# Patient Record
Sex: Female | Born: 1975 | Hispanic: No | Marital: Married | Smoking: Never smoker
Health system: Southern US, Community
[De-identification: ages and names within clinical notes are randomized; demographics above are authoritative.]

## PROBLEM LIST (undated history)

## (undated) DIAGNOSIS — Z982 Presence of cerebrospinal fluid drainage device: Secondary | ICD-10-CM

## (undated) DIAGNOSIS — G8929 Other chronic pain: Secondary | ICD-10-CM

## (undated) DIAGNOSIS — R519 Headache, unspecified: Secondary | ICD-10-CM

## (undated) DIAGNOSIS — R053 Chronic cough: Secondary | ICD-10-CM

## (undated) DIAGNOSIS — R51 Headache: Secondary | ICD-10-CM

## (undated) DIAGNOSIS — J90 Pleural effusion, not elsewhere classified: Secondary | ICD-10-CM

## (undated) DIAGNOSIS — R05 Cough: Secondary | ICD-10-CM

## (undated) HISTORY — DX: Other chronic pain: G89.29

## (undated) HISTORY — PX: OTHER SURGICAL HISTORY: SHX169

## (undated) HISTORY — DX: Headache, unspecified: R51.9

## (undated) HISTORY — DX: Headache: R51

---

## 2000-10-07 ENCOUNTER — Emergency Department (HOSPITAL_COMMUNITY): Admission: EM | Admit: 2000-10-07 | Discharge: 2000-10-07 | Payer: Self-pay | Admitting: Emergency Medicine

## 2000-12-04 ENCOUNTER — Ambulatory Visit (HOSPITAL_BASED_OUTPATIENT_CLINIC_OR_DEPARTMENT_OTHER): Admission: RE | Admit: 2000-12-04 | Discharge: 2000-12-04 | Payer: Self-pay

## 2001-03-09 ENCOUNTER — Emergency Department (HOSPITAL_COMMUNITY): Admission: EM | Admit: 2001-03-09 | Discharge: 2001-03-09 | Payer: Self-pay | Admitting: Emergency Medicine

## 2001-03-09 ENCOUNTER — Encounter: Payer: Self-pay | Admitting: Emergency Medicine

## 2001-03-10 ENCOUNTER — Encounter: Payer: Self-pay | Admitting: Emergency Medicine

## 2001-03-10 ENCOUNTER — Ambulatory Visit (HOSPITAL_COMMUNITY): Admission: RE | Admit: 2001-03-10 | Discharge: 2001-03-10 | Payer: Self-pay | Admitting: Emergency Medicine

## 2001-03-12 ENCOUNTER — Encounter: Payer: Self-pay | Admitting: Neurosurgery

## 2001-03-13 ENCOUNTER — Inpatient Hospital Stay (HOSPITAL_COMMUNITY): Admission: RE | Admit: 2001-03-13 | Discharge: 2001-03-15 | Payer: Self-pay | Admitting: Neurosurgery

## 2001-03-14 ENCOUNTER — Encounter: Payer: Self-pay | Admitting: Neurosurgery

## 2001-03-16 ENCOUNTER — Inpatient Hospital Stay (HOSPITAL_COMMUNITY): Admission: EM | Admit: 2001-03-16 | Discharge: 2001-03-18 | Payer: Self-pay

## 2001-03-16 ENCOUNTER — Encounter: Payer: Self-pay | Admitting: Neurosurgery

## 2001-03-25 ENCOUNTER — Encounter: Payer: Self-pay | Admitting: Emergency Medicine

## 2001-03-25 ENCOUNTER — Encounter: Payer: Self-pay | Admitting: Neurosurgery

## 2001-03-25 ENCOUNTER — Ambulatory Visit (HOSPITAL_COMMUNITY): Admission: RE | Admit: 2001-03-25 | Discharge: 2001-03-25 | Payer: Self-pay | Admitting: Neurosurgery

## 2001-03-25 ENCOUNTER — Emergency Department (HOSPITAL_COMMUNITY): Admission: EM | Admit: 2001-03-25 | Discharge: 2001-03-25 | Payer: Self-pay | Admitting: Emergency Medicine

## 2001-04-07 ENCOUNTER — Encounter: Payer: Self-pay | Admitting: Neurosurgery

## 2001-04-07 ENCOUNTER — Ambulatory Visit (HOSPITAL_COMMUNITY): Admission: RE | Admit: 2001-04-07 | Discharge: 2001-04-07 | Payer: Self-pay | Admitting: Neurosurgery

## 2001-04-09 ENCOUNTER — Emergency Department (HOSPITAL_COMMUNITY): Admission: EM | Admit: 2001-04-09 | Discharge: 2001-04-09 | Payer: Self-pay | Admitting: Emergency Medicine

## 2001-04-09 ENCOUNTER — Encounter: Payer: Self-pay | Admitting: Emergency Medicine

## 2001-04-11 ENCOUNTER — Emergency Department (HOSPITAL_COMMUNITY): Admission: EM | Admit: 2001-04-11 | Discharge: 2001-04-12 | Payer: Self-pay | Admitting: Emergency Medicine

## 2001-04-15 ENCOUNTER — Encounter: Admission: RE | Admit: 2001-04-15 | Discharge: 2001-04-15 | Payer: Self-pay | Admitting: Neurosurgery

## 2001-04-15 ENCOUNTER — Encounter: Payer: Self-pay | Admitting: Neurosurgery

## 2001-06-25 ENCOUNTER — Encounter: Payer: Self-pay | Admitting: Neurosurgery

## 2001-06-25 ENCOUNTER — Encounter: Admission: RE | Admit: 2001-06-25 | Discharge: 2001-06-25 | Payer: Self-pay | Admitting: Neurosurgery

## 2001-07-29 ENCOUNTER — Encounter: Payer: Self-pay | Admitting: Emergency Medicine

## 2001-07-29 ENCOUNTER — Emergency Department (HOSPITAL_COMMUNITY): Admission: EM | Admit: 2001-07-29 | Discharge: 2001-07-29 | Payer: Self-pay | Admitting: Emergency Medicine

## 2001-09-01 ENCOUNTER — Inpatient Hospital Stay (HOSPITAL_COMMUNITY): Admission: EM | Admit: 2001-09-01 | Discharge: 2001-09-07 | Payer: Self-pay | Admitting: *Deleted

## 2001-09-01 ENCOUNTER — Encounter: Payer: Self-pay | Admitting: Neurosurgery

## 2001-10-27 ENCOUNTER — Encounter: Payer: Self-pay | Admitting: Neurosurgery

## 2001-10-27 ENCOUNTER — Encounter: Admission: RE | Admit: 2001-10-27 | Discharge: 2001-10-27 | Payer: Self-pay | Admitting: Neurosurgery

## 2002-01-05 ENCOUNTER — Emergency Department (HOSPITAL_COMMUNITY): Admission: EM | Admit: 2002-01-05 | Discharge: 2002-01-05 | Payer: Self-pay | Admitting: Emergency Medicine

## 2002-01-05 ENCOUNTER — Encounter: Payer: Self-pay | Admitting: Emergency Medicine

## 2002-01-06 ENCOUNTER — Encounter: Payer: Self-pay | Admitting: Emergency Medicine

## 2002-01-06 ENCOUNTER — Observation Stay (HOSPITAL_COMMUNITY): Admission: EM | Admit: 2002-01-06 | Discharge: 2002-01-07 | Payer: Self-pay | Admitting: Emergency Medicine

## 2002-04-01 ENCOUNTER — Emergency Department (HOSPITAL_COMMUNITY): Admission: EM | Admit: 2002-04-01 | Discharge: 2002-04-01 | Payer: Self-pay | Admitting: Emergency Medicine

## 2002-04-01 ENCOUNTER — Encounter: Payer: Self-pay | Admitting: Emergency Medicine

## 2002-04-06 ENCOUNTER — Other Ambulatory Visit: Admission: RE | Admit: 2002-04-06 | Discharge: 2002-04-06 | Payer: Self-pay | Admitting: Gynecology

## 2002-04-14 ENCOUNTER — Emergency Department (HOSPITAL_COMMUNITY): Admission: EM | Admit: 2002-04-14 | Discharge: 2002-04-14 | Payer: Self-pay | Admitting: Emergency Medicine

## 2002-04-14 ENCOUNTER — Encounter: Payer: Self-pay | Admitting: Emergency Medicine

## 2002-04-16 ENCOUNTER — Encounter: Payer: Self-pay | Admitting: Emergency Medicine

## 2002-04-16 ENCOUNTER — Emergency Department (HOSPITAL_COMMUNITY): Admission: EM | Admit: 2002-04-16 | Discharge: 2002-04-16 | Payer: Self-pay | Admitting: Emergency Medicine

## 2002-06-12 ENCOUNTER — Encounter: Payer: Self-pay | Admitting: Emergency Medicine

## 2002-06-12 ENCOUNTER — Encounter (INDEPENDENT_AMBULATORY_CARE_PROVIDER_SITE_OTHER): Payer: Self-pay | Admitting: *Deleted

## 2002-06-12 ENCOUNTER — Inpatient Hospital Stay (HOSPITAL_COMMUNITY): Admission: EM | Admit: 2002-06-12 | Discharge: 2002-06-13 | Payer: Self-pay | Admitting: Emergency Medicine

## 2002-06-14 ENCOUNTER — Inpatient Hospital Stay (HOSPITAL_COMMUNITY): Admission: EM | Admit: 2002-06-14 | Discharge: 2002-07-16 | Payer: Self-pay | Admitting: Emergency Medicine

## 2002-06-14 ENCOUNTER — Encounter: Payer: Self-pay | Admitting: Emergency Medicine

## 2002-06-16 ENCOUNTER — Encounter: Payer: Self-pay | Admitting: Neurosurgery

## 2002-06-18 ENCOUNTER — Encounter: Payer: Self-pay | Admitting: Neurosurgery

## 2002-06-29 ENCOUNTER — Encounter: Payer: Self-pay | Admitting: Neurosurgery

## 2002-07-01 ENCOUNTER — Encounter: Payer: Self-pay | Admitting: Neurosurgery

## 2002-07-02 ENCOUNTER — Encounter: Payer: Self-pay | Admitting: Neurosurgery

## 2002-07-03 ENCOUNTER — Encounter: Payer: Self-pay | Admitting: Neurosurgery

## 2002-07-06 ENCOUNTER — Encounter: Payer: Self-pay | Admitting: Neurosurgery

## 2002-07-08 ENCOUNTER — Encounter: Payer: Self-pay | Admitting: Neurosurgery

## 2002-07-09 ENCOUNTER — Encounter: Payer: Self-pay | Admitting: Neurosurgery

## 2002-07-10 ENCOUNTER — Encounter: Payer: Self-pay | Admitting: Neurosurgery

## 2002-07-12 ENCOUNTER — Encounter: Payer: Self-pay | Admitting: Neurosurgery

## 2002-07-14 ENCOUNTER — Encounter: Payer: Self-pay | Admitting: Neurosurgery

## 2002-07-16 ENCOUNTER — Inpatient Hospital Stay (HOSPITAL_COMMUNITY)
Admission: RE | Admit: 2002-07-16 | Discharge: 2002-07-18 | Payer: Self-pay | Admitting: Physical Medicine & Rehabilitation

## 2002-07-18 ENCOUNTER — Encounter: Payer: Self-pay | Admitting: Physical Medicine & Rehabilitation

## 2002-07-18 ENCOUNTER — Inpatient Hospital Stay (HOSPITAL_COMMUNITY): Admission: AD | Admit: 2002-07-18 | Discharge: 2002-07-20 | Payer: Self-pay | Admitting: Neurosurgery

## 2002-07-20 ENCOUNTER — Inpatient Hospital Stay (HOSPITAL_COMMUNITY)
Admission: RE | Admit: 2002-07-20 | Discharge: 2002-07-29 | Payer: Self-pay | Admitting: Physical Medicine & Rehabilitation

## 2002-07-22 ENCOUNTER — Encounter: Payer: Self-pay | Admitting: Physical Medicine & Rehabilitation

## 2002-10-25 ENCOUNTER — Emergency Department (HOSPITAL_COMMUNITY): Admission: EM | Admit: 2002-10-25 | Discharge: 2002-10-26 | Payer: Self-pay | Admitting: Emergency Medicine

## 2002-10-25 ENCOUNTER — Encounter: Payer: Self-pay | Admitting: Emergency Medicine

## 2002-11-20 ENCOUNTER — Encounter: Payer: Self-pay | Admitting: Emergency Medicine

## 2002-11-20 ENCOUNTER — Emergency Department (HOSPITAL_COMMUNITY): Admission: EM | Admit: 2002-11-20 | Discharge: 2002-11-20 | Payer: Self-pay | Admitting: Emergency Medicine

## 2002-12-31 ENCOUNTER — Emergency Department (HOSPITAL_COMMUNITY): Admission: EM | Admit: 2002-12-31 | Discharge: 2002-12-31 | Payer: Self-pay | Admitting: Emergency Medicine

## 2002-12-31 ENCOUNTER — Encounter: Payer: Self-pay | Admitting: Emergency Medicine

## 2003-04-16 ENCOUNTER — Ambulatory Visit (HOSPITAL_COMMUNITY): Admission: RE | Admit: 2003-04-16 | Discharge: 2003-04-16 | Payer: Self-pay | Admitting: Neurosurgery

## 2003-04-16 ENCOUNTER — Encounter: Payer: Self-pay | Admitting: Neurosurgery

## 2003-05-02 ENCOUNTER — Emergency Department (HOSPITAL_COMMUNITY): Admission: EM | Admit: 2003-05-02 | Discharge: 2003-05-03 | Payer: Self-pay | Admitting: Emergency Medicine

## 2003-05-26 ENCOUNTER — Emergency Department (HOSPITAL_COMMUNITY): Admission: EM | Admit: 2003-05-26 | Discharge: 2003-05-27 | Payer: Self-pay | Admitting: Emergency Medicine

## 2003-05-29 ENCOUNTER — Inpatient Hospital Stay (HOSPITAL_COMMUNITY): Admission: AD | Admit: 2003-05-29 | Discharge: 2003-05-30 | Payer: Self-pay | Admitting: Obstetrics and Gynecology

## 2003-05-30 ENCOUNTER — Encounter: Payer: Self-pay | Admitting: Obstetrics & Gynecology

## 2003-05-30 ENCOUNTER — Inpatient Hospital Stay (HOSPITAL_COMMUNITY): Admission: AD | Admit: 2003-05-30 | Discharge: 2003-05-30 | Payer: Self-pay | Admitting: Obstetrics and Gynecology

## 2003-06-15 ENCOUNTER — Encounter: Admission: RE | Admit: 2003-06-15 | Discharge: 2003-06-15 | Payer: Self-pay | Admitting: *Deleted

## 2003-07-01 ENCOUNTER — Encounter: Admission: RE | Admit: 2003-07-01 | Discharge: 2003-07-01 | Payer: Self-pay | Admitting: *Deleted

## 2003-07-15 ENCOUNTER — Encounter: Admission: RE | Admit: 2003-07-15 | Discharge: 2003-07-15 | Payer: Self-pay | Admitting: Family Medicine

## 2003-07-29 ENCOUNTER — Encounter: Admission: RE | Admit: 2003-07-29 | Discharge: 2003-07-29 | Payer: Self-pay | Admitting: *Deleted

## 2003-08-05 ENCOUNTER — Encounter: Admission: RE | Admit: 2003-08-05 | Discharge: 2003-08-05 | Payer: Self-pay | Admitting: *Deleted

## 2003-08-12 ENCOUNTER — Encounter: Admission: RE | Admit: 2003-08-12 | Discharge: 2003-08-12 | Payer: Self-pay | Admitting: *Deleted

## 2003-08-26 ENCOUNTER — Encounter: Admission: RE | Admit: 2003-08-26 | Discharge: 2003-08-26 | Payer: Self-pay | Admitting: *Deleted

## 2003-09-06 ENCOUNTER — Ambulatory Visit (HOSPITAL_COMMUNITY): Admission: RE | Admit: 2003-09-06 | Discharge: 2003-09-06 | Payer: Self-pay | Admitting: *Deleted

## 2003-09-09 ENCOUNTER — Encounter: Admission: RE | Admit: 2003-09-09 | Discharge: 2003-09-09 | Payer: Self-pay | Admitting: *Deleted

## 2003-09-22 ENCOUNTER — Encounter: Admission: RE | Admit: 2003-09-22 | Discharge: 2003-09-22 | Payer: Self-pay | Admitting: *Deleted

## 2003-10-07 ENCOUNTER — Encounter: Admission: RE | Admit: 2003-10-07 | Discharge: 2003-10-07 | Payer: Self-pay | Admitting: *Deleted

## 2003-10-28 ENCOUNTER — Encounter: Admission: RE | Admit: 2003-10-28 | Discharge: 2003-10-28 | Payer: Self-pay | Admitting: *Deleted

## 2003-11-04 ENCOUNTER — Ambulatory Visit (HOSPITAL_COMMUNITY): Admission: RE | Admit: 2003-11-04 | Discharge: 2003-11-04 | Payer: Self-pay | Admitting: *Deleted

## 2003-11-04 ENCOUNTER — Encounter: Admission: RE | Admit: 2003-11-04 | Discharge: 2003-11-04 | Payer: Self-pay | Admitting: *Deleted

## 2003-11-11 ENCOUNTER — Encounter: Admission: RE | Admit: 2003-11-11 | Discharge: 2003-11-11 | Payer: Self-pay | Admitting: Family Medicine

## 2003-11-18 ENCOUNTER — Encounter: Admission: RE | Admit: 2003-11-18 | Discharge: 2003-11-18 | Payer: Self-pay | Admitting: *Deleted

## 2003-11-22 ENCOUNTER — Inpatient Hospital Stay (HOSPITAL_COMMUNITY): Admission: AD | Admit: 2003-11-22 | Discharge: 2003-11-22 | Payer: Self-pay | Admitting: Obstetrics & Gynecology

## 2003-11-23 ENCOUNTER — Inpatient Hospital Stay (HOSPITAL_COMMUNITY): Admission: AD | Admit: 2003-11-23 | Discharge: 2003-11-23 | Payer: Self-pay | Admitting: *Deleted

## 2003-11-25 ENCOUNTER — Encounter: Admission: RE | Admit: 2003-11-25 | Discharge: 2003-11-25 | Payer: Self-pay | Admitting: Family Medicine

## 2003-11-30 ENCOUNTER — Encounter: Admission: RE | Admit: 2003-11-30 | Discharge: 2004-02-28 | Payer: Self-pay | Admitting: *Deleted

## 2003-12-02 ENCOUNTER — Encounter: Admission: RE | Admit: 2003-12-02 | Discharge: 2003-12-02 | Payer: Self-pay | Admitting: *Deleted

## 2003-12-09 ENCOUNTER — Encounter: Admission: RE | Admit: 2003-12-09 | Discharge: 2003-12-09 | Payer: Self-pay | Admitting: *Deleted

## 2003-12-16 ENCOUNTER — Ambulatory Visit (HOSPITAL_COMMUNITY): Admission: RE | Admit: 2003-12-16 | Discharge: 2003-12-16 | Payer: Self-pay | Admitting: *Deleted

## 2003-12-16 ENCOUNTER — Encounter: Admission: RE | Admit: 2003-12-16 | Discharge: 2003-12-16 | Payer: Self-pay | Admitting: *Deleted

## 2003-12-20 ENCOUNTER — Encounter: Admission: RE | Admit: 2003-12-20 | Discharge: 2003-12-20 | Payer: Self-pay | Admitting: *Deleted

## 2003-12-23 ENCOUNTER — Encounter: Admission: RE | Admit: 2003-12-23 | Discharge: 2003-12-23 | Payer: Self-pay | Admitting: Family Medicine

## 2003-12-27 ENCOUNTER — Encounter: Admission: RE | Admit: 2003-12-27 | Discharge: 2003-12-27 | Payer: Self-pay | Admitting: *Deleted

## 2003-12-30 ENCOUNTER — Encounter: Admission: RE | Admit: 2003-12-30 | Discharge: 2003-12-30 | Payer: Self-pay | Admitting: *Deleted

## 2004-01-03 ENCOUNTER — Encounter: Admission: RE | Admit: 2004-01-03 | Discharge: 2004-01-03 | Payer: Self-pay | Admitting: *Deleted

## 2004-01-03 ENCOUNTER — Inpatient Hospital Stay (HOSPITAL_COMMUNITY): Admission: AD | Admit: 2004-01-03 | Discharge: 2004-01-03 | Payer: Self-pay | Admitting: *Deleted

## 2004-01-06 ENCOUNTER — Inpatient Hospital Stay (HOSPITAL_COMMUNITY): Admission: AD | Admit: 2004-01-06 | Discharge: 2004-01-09 | Payer: Self-pay | Admitting: Obstetrics & Gynecology

## 2004-01-06 ENCOUNTER — Encounter: Admission: RE | Admit: 2004-01-06 | Discharge: 2004-01-06 | Payer: Self-pay | Admitting: Family Medicine

## 2004-01-06 ENCOUNTER — Ambulatory Visit (HOSPITAL_COMMUNITY): Admission: RE | Admit: 2004-01-06 | Discharge: 2004-01-06 | Payer: Self-pay | Admitting: *Deleted

## 2004-09-05 ENCOUNTER — Emergency Department (HOSPITAL_COMMUNITY): Admission: EM | Admit: 2004-09-05 | Discharge: 2004-09-06 | Payer: Self-pay | Admitting: Emergency Medicine

## 2006-12-03 ENCOUNTER — Encounter: Admission: RE | Admit: 2006-12-03 | Discharge: 2006-12-03 | Payer: Self-pay | Admitting: Surgery

## 2007-05-20 ENCOUNTER — Emergency Department (HOSPITAL_COMMUNITY): Admission: EM | Admit: 2007-05-20 | Discharge: 2007-05-20 | Payer: Self-pay | Admitting: Emergency Medicine

## 2009-08-29 ENCOUNTER — Ambulatory Visit: Admission: RE | Admit: 2009-08-29 | Discharge: 2009-08-29 | Payer: Self-pay | Admitting: Obstetrics and Gynecology

## 2009-08-31 ENCOUNTER — Ambulatory Visit (HOSPITAL_COMMUNITY): Admission: RE | Admit: 2009-08-31 | Discharge: 2009-08-31 | Payer: Self-pay | Admitting: Obstetrics and Gynecology

## 2011-01-10 LAB — CBC
HCT: 39.3 % (ref 36.0–46.0)
Hemoglobin: 13.4 g/dL (ref 12.0–15.0)
MCHC: 34.1 g/dL (ref 30.0–36.0)
MCV: 89.1 fL (ref 78.0–100.0)
RDW: 13.8 % (ref 11.5–15.5)

## 2011-02-23 NOTE — Op Note (Signed)
NAMECORA, [name]                     ACCOUNT NO.:  [id number]   MEDICAL RECORD NO.:  [id number]                   PATIENT TYPE:  INP   LOCATION:  3110                                 FACILITY:  [facility]   PHYSICIAN:  Cristi Loron, M.D.            DATE OF BIRTH:  [birth year]   DATE OF PROCEDURE:  06/13/2002  DATE OF DISCHARGE:  06/13/2002                                 OPERATIVE REPORT   PREOPERATIVE DIAGNOSIS:  Ventriculoperitoneal shunt revision.   POSTOPERATIVE DIAGNOSIS:  Ventriculoperitoneal shunt revision.   PROCEDURE:  Ventriculoperitoneal shunt revision (removal of the patient's  previous system and replacement with a Medtronic 1.5 level delta valve snap  assembly with a 9 cm ventricular catheter).   SURGEON:  Cristi Loron, M.D.   ANESTHESIA:  General endotracheal.   ESTIMATED BLOOD LOSS:  100 cc.   SPECIMENS:  Cerebrospinal fluid and catheter tips.   COMPLICATIONS:  None.   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  She underwent a shunt revision in 11/02 and presents yesterday  evening with complaints of headache.  A CT scan demonstrated an increased  size of the ventricles consistent with a shunt malfunction.  I discussed the  various treatment options with the patient and her husband and father.  They  weighed the risks, benefits, and alternatives of surgery and decided to  proceed with a shunt revision.   DESCRIPTION OF PROCEDURE:  The patient was brought to the operating room by  the anesthesia team.  General endotracheal anesthesia was induced.  The  patient remained in supine position.  A roll was placed under her right  shoulder with the head turned toward the left, and her right occipital scalp  area was shaved.  This area as well as her neck, chest, and abdomen, was  prepared with Betadine scrub with Betadine solution.  Sterile  drapes were  applied.  I then injected the area to be incised with Marcaine with  epinephrine solution and used the scalpel to make a linear incision in the  occipital region, i.e., through the previous surgical incision.  I used  electrocautery to dissect down to the periosteum and to expose the previous  shunt hardware, including the valve and the Rickham reservoir.  I then  inserted a 25-gauge needle on a syringe into the Rickham reservoir.  I was  able to aspirate cerebrospinal fluid proximally without difficulty.  The  cerebrospinal fluid looked clear.  I then hooked the system up to a  manometer, and there was free flow down the manometer distally down into the  abdomen to a pressure to approximately 15, possibly indicative of a valve or  a distal shunt malfunction.  Then at this point I decided to replace the  whole system.  I removed the peritoneal catheter and disconnected the valve  from the ventricular catheter,  then carefully removed the pre-existing  ventricular catheter.  I inspected the ventricular catheter, and there was  quite a bit of fibrinous exudate on the end of the ventricular catheter.  I  sent this catheter as well as the distal end, i.e., the peritoneal catheter,  for a specimen as well as cerebrospinal fluid.  I placed a 9 cm ventricular  catheter through the previous bur hole, and it tracked down into the  ventricle without a stylet.  There was good, free flow of spinal fluid  through the catheter with some mildly bloody.  I previously passed a new  valve and peritoneal catheter by attaching a suture to the old system before  I pulled it out, and I pulled it out and was able to tie the suture to the  new system and pull it down to the abdominal incision, and then snapped the  valve onto the new catheter and there was good flow of cerebrospinal fluid  distally through the catheter.  I then carefully dissected down through the  abdominal incision to the anterior  rectus sheath.  I separated the rectus  fibers and then incised the posterior rectus sheath and then the peritoneum,  and entered the peritoneal cavity.  I could barely see the peritoneal  structures.  I then placed the distal end of the peritoneal catheter into  the peritoneal cavity and then reapproximated the patient's anterior and  posterior rectus sheath with interrupted #1 Vicryl suture and then the  subcutaneous tissue with interrupted 2-0 Vicryl and the skin with Steri-  Strips and Benzoin.  I reapproximated the patient's galea in the scalp  incision with interrupted 2-0 Vicryl sutures and the skin with stainless  steel staples.  The wounds were then coated with bacitracin solution and  sterile dressings were applied and the drapes were removed.  The patient was  subsequently extubated by the anesthesia team and transported to the  postanesthesia care unit in stable condition.  All sponge, instrument, and  needle counts were correct at the end of this case.                                               Cristi Loron, M.D.    JDJ/MEDQ  D:  06/13/2002  T:  06/15/2002  Job:  [id number]

## 2011-02-23 NOTE — H&P (Signed)
[place]  Patient:    [name], [name] Visit Number: [id number] MRN: [id number]          Service Type: Attending:  Ronaldo Miyamoto L. Franky Macho, M.D. Dictated by:   Mena Goes. Franky Macho, M.D. Adm. Date:  09/01/01                           History and Physical  ADMITTING DIAGNOSES:  1. Shunt failure.  2. Obstructive hydrocephalus.  HISTORY OF PRESENT ILLNESS: [name] is a 35 year old woman, who initially presented in June 2002.  She underwent placement of a right frontal ventriculoperitoneal shunt in June 2002, which went well.  She did well immediately after but has had at least two return trips to the hospital, one March 25, 2001, the other in July 2002 for abdominal pain and neck pain.  On neither occasion did her shunt show any signs of malfunctioning nor did she have any evidence of infection.  She returns today unresponsive.  According to her husband and, I believe, father, she has not been responsive since approximately 11:30 p.m.  She has not been talking.  She had not been complaining of a headache or any other problems and this appeared to be of acute onset today.  PAST MEDICAL HISTORY: Clinical history unremarkable.  ALLERGIES: No known drug allergies.  SOCIAL HISTORY: She is married.  She does not speak [place].  Her husband does speak some slight [place].  REVIEW OF SYSTEMS: She did have a bad headache the morning of August 31, 2001.  Family also noticed that the area in the head where the shunt was placed was swollen.  PAST SURGICAL HISTORY: Right frontal ventriculoperitoneal shunt placement on March 13, 2001.  PHYSICAL EXAMINATION:  VITAL SIGNS: Temperature 100.6 degrees, blood pressure 109/59, pulse 75, respiratory rate 16.  NEUROLOGIC: She is somnolent.  Does not speak.  Opens eyes only with significant noxious stimuli.  Pupils are not responsive.  She does have conjugate eye movements.  She is purposeful with upper  extremities and moves extremities.  She moans but, again, does not speak.  She is not following any commands.  All extremities are moving easily.  NECK: No cervical masses or tenderness.  ABDOMEN: Soft, nontender.  She does have a fluid collection overlying the bur hole site and [employer] valve.  It is ballottable.  LABORATORY DATA: Head CT shows dilated lateral ventricle, third ventricle; shunt catheter appears to be in position; shunt tube shows no disconnection in the system.  DIAGNOSES:  1. Shunt malfunction.  2. Acute hydrocephalus.  PLAN: [name] needs to go to the operating room immediately for ventricular drainage.  As I am not sure why the shunt is not working it may be possible I need to now place the shunt in only ventricular catheter, but either way she needs to have ventricular drainage obtained quickly.  I explained this to the father and the husband.  They understand.  Risks of the procedure including bleeding, infection, stroke, and death were explained. They understood and wish to proceed. Dictated by:   Mena Goes. Franky Macho, M.D. Attending:  Mena Goes. Franky Macho, M.D. DD:  09/01/01 TD:  09/01/01 Job: [id number]

## 2011-02-23 NOTE — Discharge Summary (Signed)
NAMELEANOR, VORIS                     ACCOUNT NO.:  [id number]   MEDICAL RECORD NO.:  [id number]                   PATIENT TYPE:  IPS   LOCATION:  4010                                 FACILITY:  [facility]   PHYSICIAN:  Ranelle Oyster, M.D.             DATE OF BIRTH:  Mar 09, 1976   DATE OF ADMISSION:  07/20/2002  DATE OF DISCHARGE:  07/29/2002                                 DISCHARGE SUMMARY   DISCHARGE DIAGNOSES:  1. Hydrocephalus with revision of ventriculoperitoneal shunt.  2. Lethargy, improved.  3. Hypokalemia, resolved.   HISTORY OF PRESENT ILLNESS:  The patient is a 35 year old female with  history of adult-onset aqueduct restenosis with hydrocephalus and multiple  VP shunt revisions, most recent of July 09, 2002, with CSF positive for  Enterococcus in culture.  She was treated with a 20-day course of IV  antibiotics, transferred to [facility] for progressive therapies on July 16, 2002.  Patient with fluctuating mental status and Reglan started for  improvement in initiation.  On July 18, 2002, family with concerns  regarding the patient's mental status and a repeat CCT done showed enlarging  ventricle with malfunctioning of shunt.  She was taken to OR by Dr. Cristi Loron and left frontoparietal ventriculopleural shunt removed and right  occipital ventriculopleural shunt placed the same day.  CSF culture were  taken and are pending.  Patient to remain on IV vancomycin and Rocephin  until cultures finalized out.  CIR was reconsulted for the patient to  continue with progressive therapies.   PAST MEDICAL HISTORY:  Past medical history is significant for adult-onset  aqueductal stenosis with hydrocephalus and VP shunt revision x4.   ALLERGIES:  No known drug allergies.   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  She does not use any tobacco or  alcohol.   HOSPITAL COURSE:  The  patient was admitted to the rehab on July 20, 2002  for inpatient therapies to consist of PT and OT daily.  Past admission, she  was maintained on Reglan for help with activation.  She was noted to be A&O  x3 and was able to follow command without difficulty.  Labs done on July 21, 2002 showed hemoglobin 12.1, hematocrit 36.4, white count 9.6, platelets  143,000; sodium 141, potassium 3.2, chloride 104, CO2 29, BUN 10, creatinine  0.7.  Her hypokalemia was supplemented and recheck of July 28, 2002  showed resolution with potassium of 3.7.  The patient's CSF cultures showed  no growth and she was taken off vancomycin and Rocephin on July 24, 2002.  The patient's craniotomy incisions have healed well without any signs or  symptoms of infection.  A right chest wall incision was noted to be healing  well.  Sutures were discontinued on July 27, 2002.  The patient's mental  status has fluctuated some, in part due  to fatigue level.  On July 22, 2002, her family had concerns about the patient's lethargy level as well as  poor responsiveness.  Repeat CCT done showed resolution of intraventricular  hemorrhage, third ventricle increased in size but lateral and fourth  ventricles normal, decrease in bilateral frontal edema and contusions.  The  patient was monitored along and mental status has been stable.  The patient  has continue to make progress in therapy.  At time of discharge, she is  modified independent for transfers, supervision _____________.  She requires  supervision secondary to a tendency to stagger when standing and ambulating.  She is able to catch her balance independently for 95% of time, however, on  rare episodes, she has required assistance to recover.  The patient was  supervision for ADLs secondary to severe short-term memory deficits.  She  was able to maintain and sustain attention to ADLs.  Basic comprehension and  expression were intact.  She was able to  respond to communication and  express basic needs without difficulty.  She was able to self-monitor for  errors.  High-level comprehension was decreased secondary to decrease in  attention as well as language barrier.  She was unable to use any  compensatory techniques secondary to language barrier.  High-level  expression is reported to be intact by family.  The patient is to continue  to receive followup home health PT and OT by [facility] past  discharge.  Twenty-four-hour supervision is recommended.  On July 29, 2002, the patient is discharged to home.   DISCHARGE MEDICATIONS:  1. Lexapro 10 mg a day.  2. Oxycodone IR 5 to 10 mg q.6-8h. p.r.n. pain only.  3. Use Tylenol for pain.   ACTIVITY:  Activities at supervision area.   WOUND CARE:  Keep area clean and dry.   SPECIAL INSTRUCTIONS:  No alcohol.  No smoking.  No driving.   FOLLOWUP:  Patient to follow up with Dr. Venetia Maxon for postop check in the next  few weeks, follow up with Dr. Riley Kill, December 2nd, at 11 a.m.     Dian Situ, P.A.          Ranelle Oyster, M.D.    PP/MEDQ  D:  08/11/2002  T:  08/12/2002  Job:  [id number]   cc:   Danae Orleans. Venetia Maxon, M.D.  [address]  [place]  [place] [address]  Fax: [phone]

## 2011-02-23 NOTE — Discharge Summary (Signed)
   [name], [name]                     ACCOUNT NO.:  [id number]   MEDICAL RECORD NO.:  [id number]                   PATIENT TYPE:  INP   LOCATION:  3103                                 FACILITY:  [facility]   PHYSICIAN:  Danae Orleans. Venetia Maxon, M.D.               DATE OF BIRTH:  24-Apr-1976   DATE OF ADMISSION:  06/14/2002  DATE OF DISCHARGE:  07/16/2002                                 DISCHARGE SUMMARY   REASON FOR ADMISSION:  Shunt malfunction with obstructive hydrocephalus and  subsequent group B strep infection of the shunt requiring prolonged  externalized ventricular drainage and hardware changes.   HISTORY OF PRESENT ILLNESS AND HOSPITAL COURSE:  The patient is well known  to the [facility].  She was admitted by Dr. Tressie Stalker on June 14, 2002.  She had had a shunt revision 2 days prior to  that on June 12, 2002 and she was discharged home however, she returned  to the emergency room sleepy and difficult to arouse, a head CT done showed  a significant enlargement of the ventricles and it was felt that this  indicated a shunt malfunction.  The patient subsequently had externalization  of the shunt without evidence of any flow from the shunt and subsequently  she had a ventricular drain placed, subsequently this grew out bacteria  consistent with an infection.  It was therefore elected to take her back to  surgery, remove the externalized drain and to place bilateral frontal  ventricular drains secondary to obstruction with removal of her previous  right occipital shunt.  This was done on June 18, 2002.  The patient  was then observed in intensive care for a considerable period of time with  externalized drainage of the shunts with eventual clearing of an  enterococcal infection.  The patient was kept on ampicillin, rifampin, and  gentamicin and on July 09, 2002 it was elected to place a left frontal  ventriculopleural shunt and this was  done.  The patient appeared to do well  with this procedure.  She had a repeat head CT which showed some enlargement  of ventricular system and the patient was somewhat sleepier, the low-  pressure adjustable valve was then dropped to 30 mm H2O, the patient was  doing better and was transferred to the rehabilitation service.  Instructions were to follow up in 4 weeks with me in my office.                                               Danae Orleans. Venetia Maxon, M.D.    JDS/MEDQ  D:  08/07/2002  T:  08/09/2002  Job:  [id number]

## 2011-02-23 NOTE — H&P (Signed)
[name], [name]                     ACCOUNT NO.:  [id number]   MEDICAL RECORD NO.:  [id number]                   PATIENT TYPE:  INP   LOCATION:  3113                                 FACILITY:  [facility]   PHYSICIAN:  Cristi Loron, M.D.            DATE OF BIRTH:  1976/04/18   DATE OF ADMISSION:  06/14/2002  DATE OF DISCHARGE:                                HISTORY & PHYSICAL   CHIEF COMPLAINT:  Sleepy.   HISTORY OF PRESENT ILLNESS:  The patient is a 35 year old white female who  had a shunt placed back in 2001.  She had a revision 08/2001 and had a  malfunction just two days ago; revised her shunt on 06/12/2002, __________  on 06/13/02.  They looked good and she was doing well clinically.  She was  discharged to home and she was brought back by her husband who said this  evening she became sleepy, had a headache and he had some difficulty  arousing her.  She was brought into the emergency department, worked up by  CT scan which demonstrated enlarged ventricles.   The remainder of the patient's medical history can be obtained via the  medical record.   PHYSICAL EXAMINATION:  HEENT:  Normocephalic.  Incisions appear to be  healing well.  No signs of infection.  Her pupils are 4 mm and somewhat  reactive O.U.  She has conjugate gaze.  ABDOMEN:  Soft, nontender.  Incision here appears to be healing well, as is  her neck incision.  NEUROLOGIC:  The patient is somnolent, Glasgow coma scale 9 (82M5V2).  She  is moving all four extremities.  She localizes bilaterally.  Pupils, as  above, are 4 mm and sluggish.   LABORATORY DATA:  CT scan performed 06/14/2002 without contrast demonstrates  the patient's ventricles have enlarged significantly since her postoperative  scan on 06/13/02, indicative of a VP shunt malfunction.   ASSESSMENT AND PLAN:  Shunt malfunction.  I discussed the situation with the  patient's husband and father, i.e., the next of kin.  I explained to  them  that the shunt appears to not be working again and I recommended that we  externalize the shunt.  I initially suspected that there was some problem  distally.  I saw the shunt and as it turned out, it was not draining  distally.  I therefore discussed with them treatment options and at this  point, I am somewhat concerned that she may have an infection.  I therefore  have recommended that we place a right frontal ventriculostomy.  I described  the procedure and its risks including infection, hemorrhage, stroke,  seizures, shunt malplacement, shunt ventriculostomy malfunction, coma,  death.  They weighed the risks and benefits of the procedure and consented  on behalf.   I will treat the patient empirically with antibiotics until the cultures are  final from her surgery of 06/12/02.  As mentioned, she will have the  shunt  reinserted as she is quite symptomatic without it.                                               Cristi Loron, M.D.    JDJ/MEDQ  D:  06/15/2002  T:  06/16/2002  Job:  [id number]

## 2011-02-23 NOTE — Op Note (Signed)
[place]  Patient:    [name], [name] Visit Number: [id number] MRN: [id number]          Service Type: MED Location: 3100 [phone] Attending Physician:  Coletta Memos Dictated by:   Stefani Dama, M.D. Proc. Date: 09/05/01 Admit Date:  09/01/2001                             Operative Report  PREOPERATIVE DIAGNOSIS:  Obstructive hydrocephalus, ventricular peritoneal shunt failure.  POSTOPERATIVE DIAGNOSIS:  Obstructive hydrocephalus, ventricular peritoneal shunt failure.  PROCEDURE:  Right occipital ventriculoperitoneal shunt insertion.  SURGEON:  Stefani Dama, M.D.  ANESTHESIA:  General endotracheal.  INDICATIONS:  The patient is a young lady who six months ago was diagnosed with obstructive hydrocephalus felt to be secondary to adult-onset aqueductal stenosis.  The patient was treated with a ventriculoperitoneal shunt in the right frontal region.  About a week ago, she became acutely ill and obtunded; it was found that she has acute hydrocephalus.  The ventriculoperitoneal shunt was explored and removed but it was felt that the proximal and distal ends appeared to be working; nonetheless, she was treated with a ventriculostomy through the original opening for the ventriculoperitoneal shunt and it was planned that if cultures remained negative, the patient would be taken for a new ventriculoperitoneal shunt.  DESCRIPTION OF PROCEDURE:  The patient was brought to the operating room and placed on the table in supine position.  After a small induction of general endotracheal anesthesia, the ventriculostomy from the right frontal region was removed; this had been clamped approximately six hours prior to the surgical procedure.  The patients right side of her head had been shaved and she had been on antibiotics.  The posterior aspect of the right frontal scalp was then shaved further and the patient was prepped for a right  occipital ventriculoperitoneal shunt.  The head was prepped with Duraprep down through the neck, the right anterior chest and the right upper quadrant region.  A point was chosen for insertion 6 cm superior to the inion and 3 cm lateral to the midline.  The scalp was opened with a vertical incision in this region, self-retaining retractor was placed in the wound and the skull was identified. A subcutaneous pocket was then tunneled down the posterior aspect of the neck. The abdominal incision was then opened by excising the scar tissue on the right upper quadrant and dissecting down to the preperitoneal space.  The anterior rectus sheath was opened.  The rectus muscle was separated and the posterior rectus sheath was then opened and the peritoneal lining was identified.  A Schon catheter was used to pass the catheter from proximal to distal along a path from the superior to the inferior incision.  A 1.5 performance level Delta valve shunt system was chosen.  The system was irrigated with Bacitracin antibiotic irrigating solution before being inserted.  The skull was then perforated with a high-speed air drill and a pineapple dissecting bit.  The dura was identified, cauterized and then opened in a cruciate fashion with a #15 blade.  A 6-cm ventricular catheter was then inserted into the occipital horn.  Spinal fluid under some modest pressure was obtained.  The system was snapped together to the distal portion of the tubing and the system was noted to drain spinal fluid spontaneously from the distal end of the catheter.  The peritoneal cavity was then opened and the  tubing inserted.  A suture was placed in the posterior rectus sheath and then the rectus muscle was allowed to reflect into its original position and the anterior rectus sheath was closed with interrupted 3-0 Vicryl sutures.  The subcutaneous tissue was then closed with 3-0 Vicryl, as were the subcuticular tissues and the skin on  the abdomen.  In the cranial portion of the surgical site, 2-0 Vicryl was used in interrupted fashion in the galea and then surgical staples were used on the skin.  The patient tolerated the procedure well and was returned to recovery room in stable condition. Dictated by:   Stefani Dama, M.D. Attending Physician:  Coletta Memos DD:  09/05/01 TD:  09/05/01 Job: [id number]

## 2011-02-23 NOTE — Op Note (Signed)
[place]  Patient:    Lisa, Johns Visit Number: [id number] MRN: [id number]          Service Type: MED Location: MICU 2114 [id number] Attending Physician:  Coletta Memos Dictated by:   Mena Goes. Franky Macho, M.D. Admit Date:  09/01/2001                             Operative Report  PREOPERATIVE DIAGNOSES: 1. Right frontal ventriculoperitoneal shunt malfunction, PS Medical valve. 2. Acute hydrocephalic.  POSTOPERATIVE DIAGNOSES: 1. Right frontal ventriculoperitoneal shunt malfunction, PS Medical valve. 2. Acute hydrocephalic.  PROCEDURE:  Removal of right frontal ventriculoperitoneal shunt, placement of ventricular catheter.  SURGEON:  Kyle L. Franky Macho, M.D.  COMPLICATIONS:  None.  ANESTHESIA:  General endotracheal.  FINDINGS:  Ventricular catheter was easily accessible.  There was spontaneous flow distal to the shunt catheter, and there was spontaneous flow into the peritoneal end.  Unclear why shunt had malfunctioned.  INDICATION:  [name] is a 35 year old with acute onset of somnolence this evening after suffering a bad headache.  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  The shunt was significantly placed in June 2002.  DESCRIPTION OF PROCEDURE:  [name] was taken to the operating room. She was intubated, placed under general anesthesia without difficulty.  She was placed supine with a shoulder roll underneath her right shoulder.  Head was turned toward the left on a doughnut.  The head was then shaved, and she had her abdomen, thorax, neck, and head prepped with Duraprep.  She was draped sterilely.  I then opened the cranial incision, which had a significant amount of fluid underneath.  After opening that, I got spontaneous return of spinal fluid.  I then used a 10 cc syringe and a 25-gauge needle to tap the ventricular catheter of the [place] reservoir.  I was able to aspirate quite easily  and receive flow of clear spinal fluid.  That fluid was sent for Gram stain, culture, cell count, and differential.  I then opened a small area behind the ear and found the shunt catheter, isolated that, and then delivered that from the wound.  I divided it with scissors.  I attached a manometer to the distal peritoneal end, and there was free flow of saline into the distal end of the catheter.  I also had spontaneous flow from the proximal end of the catheter distal to the shunt valve.  Unclear at that point why the catheter was not working.  As she had significant hydrocephalus and it was obvious that she had a shunt malfunction and I could not obtain cultures quickly enough to determine whether or not she had a shunt infection, I felt that a ventricular catheter should be placed for drainage, and cultures could then be allowed to grow.  I then placed a ventricular catheter to 6 cm at the outer table and achieved spontaneous flow of spinal fluid.  I tunneled that distal to her incision and secured it to the stylus with 3-0 nylon suture.  I then closed the cranial incision with a 3-0 nylon suture, simple interrupted sutures.  I closed the postauricular incision with a single 3-0 nylon stitch.  Sterile dressings were applied.  The patient was extubated moving all extremities and moving purposefully. Dictated by:   Mena Goes. Franky Macho, M.D. Attending Physician:  Coletta Memos DD:  09/01/01 TD:  09/01/01 Job: [id number]

## 2011-02-23 NOTE — Consult Note (Signed)
   [name], SCHMIESING NO.:  [id number]   MEDICAL RECORD NO.:  [id number]                   PATIENT TYPE:  EMS   LOCATION:  MAJO                                 FACILITY:  [facility]   PHYSICIAN:  Coletta Memos, M.D.                  DATE OF BIRTH:  [birth year]   DATE OF CONSULTATION:  DATE OF DISCHARGE:                                   CONSULTATION   The patient presented to the emergency room after one episode of nausea and  some vomiting. A head CT was done as was a shunt series.  The head CT showed  that her third ventricle was slightly larger than it had been in February.  The lateral ventricles, the temporal horns and the fourth ventricle was  still normal size.   On physical examination, she had no evidence of papilledema.  She had no  drift.  Normal gait.  Speech was clear and fluent.  Memory length and  attention span were within normal limits.   As usual, we used the husband as an interpreter, but she was feeling better.  After she threw up she felt better.   She has a long history of multiple episodes of nausea and vomiting and comes  into the emergency room actually quite frequently. I reviewed the films and  after the exam felt that it would be best that she be sent home.  I think  that something that can be handled as an outpatient.  She needs to more than  likely have her third ventricle looked in via ventriculoscopy which we are  not equipped to do at [facility].  There is nothing about her neurologic  exam which suggested sudden or eminent neurologic compromise.  I explained  this to her husband.  He understands.  She will be discharged home. She was  given instructions to call Dr. Fredrich Birks office for an appointment tomorrow.                                               Coletta Memos, M.D.    KC/MEDQ  D:  12/31/2002  T:  01/01/2003  Job:  [id number]

## 2011-02-23 NOTE — Op Note (Signed)
NAMEKENISHIA, PLACK NO.:  [id number]   MEDICAL RECORD NO.:  [id number]                   PATIENT TYPE:  INP   LOCATION:  3112                                 FACILITY:  [facility]   PHYSICIAN:  Cristi Loron, M.D.            DATE OF BIRTH:  January 19, 1976   DATE OF PROCEDURE:  07/18/2002  DATE OF DISCHARGE:                                 OPERATIVE REPORT   BRIEF HISTORY:  The patient is a 35 year old [place] immigrant female who  Dr. Venetia Maxon put a shunt in for adult-onset aqueductal stenosis.  She has had  multiple shunt avulsions and shunt revisions.  She has also had a shunt  infection with Enterococcus.  Dr. Venetia Maxon revised the shunt approximately ten  days ago, and initially, she did well.  She went to rehab, but she was  complaining of headaches, trouble with vision, etc.  A CT scan was obtained,  which demonstrated progressive enlargement of her ventricles.  By this  evening, the patient was somnolent and hard to arouse.  I tapped the  patient's shunt.  There was no spontaneous flow of spinal fluid, and I could  not aspirate the spinal fluid, indicative of a proximal shunt malfunction.  I discussed the situation with the patient's husband and other family  members and recommended a shunt revision, i.e. shunt replacement.  I  discussed the procedure of removal of her ventriculopleural shunt and  placement of a right occipital ventriculopleural shunt, as well as the risks  of anesthesia, infection, hemorrhage, stroke, seizures, shunt malfunction,  shunt replacement, etc.  The patient's family consented on behalf of the  patient.   PREOPERATIVE DIAGNOSES:  Left frontal ventriculopleural shunt malfunction.   POSTOPERATIVE DIAGNOSES:  Left frontal ventriculopleural shunt malfunction.   PROCEDURE:  Placement of right occipital ventriculopleural shunt (Medtronic  1.0 level Delta valve with 9 cm snap assembly ventricular catheter); removal  of left  frontal ventriculopleural shunt.   SURGEON:  Cristi Loron, M.D.   ASSISTANT:  None.   ANESTHESIA:  General endotracheal.   ESTIMATED BLOOD LOSS:  Minimal.   SPECIMENS:  The old shunt and cerebrospinal fluid.   DRAINS:  None.   COMPLICATIONS:  None.   DESCRIPTION OF PROCEDURE:  The patient was brought to the operating room by  the anesthesia team.  General endotracheal anesthesia was induced.  The  patient remained in the supine position.  A roll was placed under her right  shoulder, and her head was turned towards the left.  Her entire scalp, right  neck, thorax, and abdomen were prepared with Betadine scrub and Betadine  solution.  Sterile drapes were then applied.  I then injected the area to be  incised with Marcaine with epinephrine solution and used the scalpel to make  an inverted L type of incision in the patient's right retroauricular region.  I then made a second incision just below the patient's breast in the midline  and then used a shunt passer to create a subcutaneous tunnel.  I then passed  the distal end of the shunt through the shunt passer from the operative  lower incision.  I then used the high-speed drill to create a right  occipital bur hole, and then I coagulated the dura with bipolar  electrocautery, as well as the surface of the brain, and then I inserted a 9  cm ventricular catheter into the lateral ventricle on the first pass, using  standard trajectories.  There was release of spinal fluid under high  pressure.  Some of the spinal fluid was caught and saved for specimen.  I  then connected the ventricular catheter to the valve and snapped it in place  and then placed the valve in a subcutaneous pocket.  I traded with tonsil  forceps and pulled the slack out of the distal end of the catheter, and then  noted there was quite brisk, free, spontaneous flow of cerebrospinal fluid  from the distal end of the catheter.  I then closed the scalp incision  with  interrupted 2-0 Vicryl sutures in the galea and stainless steel staples on  the skin.  I then used electrocautery to dissect through the intercostal  muscle above the exposed rib and chest incision.  Then using Metzenbaum  scissors to ligate the pleura, this allowed entrance into the pleural  cavity.  I could clearly see the lung sliding up and down the pleural  cavity.  I inserted the distal end of the shunt into the pleural cavity  after again checking to make sure there was continuous free flow of spinal  fluid.  I then closed this wound with interrupted 2-0 Vicryl suture to  reapproximate the fascia and interrupted 2-0 Vicryl suture in the  subcutaneous tissue and Steri-Strips on the skin.   I now turned my attention to removal of the old shunt.  I used scissors to  ligate the patient's metal suture in the left frontal scalp, and this suture  was removed.  I then used a scalpel to open up the old wound.  We then  checked it for exposure.  This exposed the underlying shunt.  We pulled the  shunt out carefully.  We removed the proximal and distal ends without  difficulty.  This was sent for a culture.  The galea was then reapproximated  with interrupted 2-0 Vicryl suture and the skin with stainless steel  staples.  The wounds were then coated with Bacitracin ointment, and sterile  dressings were applied.  The drapes were removed, and the patient was  subsequently extubated by the anesthesia team and transported to the  postanesthesia care unit in stable condition.  All sponge, instrument, and  needle counts were correct at the end of this case.   I should mention that the distal end of the old catheter appeared to be full  of blood clot.                                               Cristi Loron, M.D.    JDJ/MEDQ  D:  07/18/2002  T:  07/19/2002  Job:  [id number]

## 2011-02-23 NOTE — Op Note (Signed)
[name], [name]                     ACCOUNT NO.:  [id number]   MEDICAL RECORD NO.:  [id number]                   PATIENT TYPE:  INP   LOCATION:  3113                                 FACILITY:  [facility]   PHYSICIAN:  Danae Orleans. Venetia Maxon, M.D.               DATE OF BIRTH:  [birth year]   DATE OF PROCEDURE:  06/18/2002  DATE OF DISCHARGE:                                 OPERATIVE REPORT   PREOPERATIVE DIAGNOSIS:  Obstructive hydrocephalus and shunt malfunction.   POSTOPERATIVE DIAGNOSIS:  Obstructive hydrocephalus and shunt malfunction.   PROCEDURES:  1. Placement of new left frontal interventricular drain.  2. Revision of right frontal interventricular catheter.  3. Removal of right occipital shunt.   SURGEON:  Danae Orleans. Venetia Maxon, M.D.   ANESTHESIA:  General endotracheal anesthesia.   ESTIMATED BLOOD LOSS:  Minimal.   COMPLICATIONS:  None.   DISPOSITION:  To ICU.   INDICATIONS:  The patient is a shunt-dependent woman with obstructive  hydrocephalus.  She had recently had a shunt revision and became obtunded,  which required placement of right frontal ventricular drain.  There was some  blood along the tract of the drain, and today she became less responsive and  went for a CT scan, which demonstrated enlargement of the left ventricular  system and collapse of the right frontal horn, and it was elected to take  her to surgery to replace the right frontal drain and to place a new left  frontal ventricular drain and to remove her right occipital shunt, which was  not working.   DESCRIPTION OF PROCEDURE:  The patient was brought to the operating room.  The frontal scalp was shaved and prepped and draped in the usual sterile  fashion.  An incision was placed in the left frontal region approximately 3  cm off the midline just anterior to the coronal suture and using direct  visualization and a small bur and Anspach drill, a bur hole was created.  The dura was then incised with  electrocautery.  The ventricular drain was  inserted to a depth of 7 cm with excellent brisk return of high-pressure,  clear CSF.  This drain was then tunneled through a separate stab incision  and capped and the wounds were sutured and a nylon stitch was placed.  The  old right frontal drain, which appeared no longer to be working, was  removed, and a new ventricular drain was placed down the course of that  catheter to a depth of 6 cm with brisk return of blood-tinged CSF.  This was  then tunneled in a similar fashion and the wound was closed with nylon  stitches.  The patient was then repositioned with her right shoulder  elevated, and the right occipital region was prepped and the old incision  was reopened.  The old shunt system was removed.  The wound was then  irrigated with bacitracin and saline and closed with 3-0 Vicryl  sutures and  running nylon stitch.  All wounds were covered with occlusive gauze  dressings.                                                Danae Orleans. Venetia Maxon, M.D.    JDS/MEDQ  D:  06/18/2002  T:  06/19/2002  Job:  [id number]

## 2011-02-23 NOTE — Op Note (Signed)
[name], GELBER                     ACCOUNT NO.:  [id number]   MEDICAL RECORD NO.:  [id number]                   PATIENT TYPE:  INP   LOCATION:  3103                                 FACILITY:  [facility]   PHYSICIAN:  Danae Orleans. Venetia Maxon, M.D.               DATE OF BIRTH:  Mar 12, 1976   DATE OF PROCEDURE:  07/09/2002  DATE OF DISCHARGE:                                 OPERATIVE REPORT   PREOPERATIVE DIAGNOSES:  Hydrocephalus and shunt infection.   POSTOPERATIVE DIAGNOSES:  Hydrocephalus and shunt infection.   OPERATION PERFORMED:  Left frontoventricular pleural shunt placement.   SURGEON:  Danae Orleans. Venetia Maxon, M.D.   ASSISTANT:  Tanya Nones. Jeral Fruit, M.D.   ANESTHESIA:  General endotracheal.   ESTIMATED BLOOD LOSS:  Minimal.   COMPLICATIONS:  None.   DISPOSITION:  Neuro intensive care unit.   INDICATIONS FOR PROCEDURE:  [name] is a 35 year old woman  who is septic and with hydrocephalus secondary to aqueductal stenosis.  She  had recent shunt revision and had some interventricular bleeding and also an  Enterococcus shunt infection which required prolonged external drainage.  The patient had clearing of the enterococcal shunt infection but she  required persistently low drainage and for this reason, it was elected to  replace her ventricular drain with a ventriculopleural shunt.   DESCRIPTION OF PROCEDURE:  The patient was brought to the operating room.  Following satisfactory and uncomplicated induction of general endotracheal  anesthesia and placement of intravenous lines, the patient was placed in  supine position on the operating table.  The left side of her head and torso  were elevated on sheet rolled, head on a donut head holder.  Her left  frontoparietal and postauricular scalp were shaved prepped and draped in the  usual sterile fashion as was her neck and upper chest.  Her previous  ventriculostomy incision was reopened and the old ventricular drain  was  removed.  The new ventricular catheter was inserted down this tract with  good brisk return of CSF.  The Codman programmable shunt was assembled and  inserted through a separate subcutaneous tract which was made with a  tunneler with an interval postauricular incision to the lateral chest wall  where an incision was made at approximately the level of the sixth rib.  The  rib was identified and the pleura was grasped with a DeBakey pick up and  incised with Metzenbaum scissor.  After the shunt system was assembled, the  catheter was seen to have spontaneous flow of CSF and the distal catheter  was then inserted into the pleural space. The 2-0 silk tie was placed where  the valve joined the catheter where the ventricular catheter was attached to  the [place] reservoir.  The shunt was programed to opening pressure of 50  and subsequently after the shunt had been positioned, all incisions were  irrigated with bacitracin saline and closed with interrupted Vicryl sutures.  The cranial incision was closed with running locking nylon stitch.  The  cranial wound was dressed with bacitracin, Telfa gauze and tape.  The chest  wall  was dressed with Steri-Strips and occlusive dressing.  The patient appeared  to tolerate the procedure well, was taken to the neuro intensive care unit  in stable and satisfactory condition.  Chest x-ray was obtained at the end  of the case which demonstrated no pneumothorax.  Counts were correct at the  end of the case.                                                 Danae Orleans. Venetia Maxon, M.D.    JDS/MEDQ  D:  07/09/2002  T:  07/09/2002  Job:  [id number]

## 2011-02-23 NOTE — Discharge Summary (Signed)
[place]  Patient:    [name], [name] Visit Number: [id number] MRN: [id number]          Service Type: MED Location: [id number] Attending Physician:  Coletta Memos Dictated by:   Stefani Dama, M.D. Admit Date:  09/01/2001 Discharge Date: 09/07/2001                             Discharge Summary  ADMITTING DIAGNOSIS:  Adult-onset aqueductal stenosis with hydrocephalus, ventriculoperitoneal shunt failure with obstructive hydrocephalus.  DISCHARGE DIAGNOSIS:  Adult-onset aqueductal stenosis with hydrocephalus, ventriculoperitoneal shunt failure with obstructive hydrocephalus.  MAJOR OPERATIONS: 1. Exploration of ventriculoperitoneal shunt with removal of    ventriculoperitoneal shunt, placement of right frontoventricular catheter. 2. Insertion of right occipital ventriculoperitoneal shunt on September 05, 2001.  CONDITION ON DISCHARGE:  Improving.  HOSPITAL COURSE:  The patient is a 35 year old individual who was diagnosed with adult-onset aqueductal stenosis several months ago.  She underwent placement of a right frontal ventriculoperitoneal shunt.  She was doing well however, the day of her admission she felt poorly and was found to have profound hydrocephalus and was lethargic and stuporous when she presented to the emergency room.  She was taken to the operating room where she underwent exploration of the shunt and it appeared that both the proximal and distal ends of the shunt were working however, because of concern that there may be shunt infection the shunt system was removed and ventriculostomy catheter was placed.  The patient has remained stable, cultures have remained negative and at this time it is felt that she requires a ventriculoperitoneal shunt and this was placed and performed in the right occipital region on September 05, 2001.  During the postoperative period, the patient has done well, her incisions are clean  and dry, she is not having any significant pain or discomfort.  At this time she is discharged home with prescription for Darvocet as needed for pain.  She will be seen one weeks time for further followup.  A postoperative CT scan has not been performed at this time. Dictated by:   Stefani Dama, M.D. Attending Physician:  Coletta Memos DD:  09/07/01 TD:  09/07/01 Job: [id number]

## 2011-02-23 NOTE — H&P (Signed)
NAMEFLYNN, [name]                     ACCOUNT NO.:  [id number]   MEDICAL RECORD NO.:  [id number]                   PATIENT TYPE:  INP   LOCATION:  3110                                 FACILITY:  [facility]   PHYSICIAN:  Cristi Loron, M.D.            DATE OF BIRTH:  1976/04/21   DATE OF ADMISSION:  DATE OF DISCHARGE:  06/13/2002                                HISTORY & PHYSICAL   CHIEF COMPLAINT:  Headaches.   HISTORY OF PRESENT ILLNESS:  The patient is a 35 year old [place] female who  was a patient of Dr. Forest Gleason.  Dr. Elenore Paddy saw her back in 2001 when she was  diagnosed with adult-onset aqueductal stenosis.  He placed a ventricular  peritoneal shunt at that time.  She had a shunt malfunction in which she  became obtunded in 08/2001.  She was admitted by Dr Franky Macho, who  externalized her shunt and felt she might have a shunt infection.  The  cultures were negative.  She subsequently had a replacement of her shunt by  Dr. Danielle Dess.  She had done fairly well and in fact had a surveillance head CT  back in June which looked good.  About 3 days ago the patient began  developing headaches.  She has had no nausea, vomiting, seizures.  She has  had some somnolence.  She came to the ER this evening, was worked up with a  cranial CT scan which demonstrated enlarged ventricles consistent with a  shunt malfunction.  A neurosurgical consultation was requested by Dr. Lynelle Doctor.   I evaluated the patient.  Currently she complains of headache without other  complaints. The patient's medical history is obtained via the patient and  her father who speaks English fairly well, and her husband speaks English  moderately well, as well as medical records.   PAST MEDICAL HISTORY:  Positive for adult-onset aqueductal  stenosis/hydrocephalus as above.   PAST SURGICAL HISTORY:  Placement of ventricular peritoneal shunt in 2001 by  Dr. Elenore Paddy.  Revision of ventricular peritoneal shunt in 08/2001 by  Dr.  Franky Macho and Dr. Danielle Dess.   MEDICATIONS:  Prior to admission none.   ALLERGIES:  SHE HAS NO KNOWN DRUG ALLERGIES.   FAMILY MEDICAL HISTORY:  Noncontributory.   SOCIAL HISTORY:  The patient is married.  She has no children.  She is not  employed.  She denies tobacco, ethanol, or drug use.   REVIEW OF SYSTEMS:  Negative except as above.   PHYSICAL EXAMINATION:  GENERAL:  Pleasant 35 year old [place] female who  does not speak English very well, in no apparent distress.   HEENT:  Normocephalic.  She has a right occipital shunt hardware in place,  no signs of infection in this incision.  She has an old right frontal  incision which looked healed well, as well.  Pupils are equal, round, and  reactive to light.  She has the inability to perform upward gaze, which is a  new finding for her.  Oropharynx:  Benign.   NECK:  Supple.  No deformities.  Good range of motion.  No meningismus.   THORAX:  Symmetrical.   LUNGS:  Clear to auscultation.   HEART:  Regular rate and rhythm.   ABDOMEN:  Soft, nontender.  Abdominal incision well-healed.   EXTREMITIES:  No deformities.   NEUROLOGICAL EXAM:  The patient is alert and oriented times 3.  Cranial  nerves II-XII are grossly intact bilaterally except as above.  She has  Parinaud's syndrome.  Vision and hearing grossly normal bilaterally.  Motor  strength is 5/5 in deltoids, biceps, hand grip, wrist __________ , as well  as quadriceps, gastrocnemius.  She has 2/4 bilateral biceps, triceps,  brachial radialis, quadriceps, gastrocnemius reflexes, no active clonus.  Cerebellar exam is intact to rapid alternating movements of the upper  extremities.  Bilateral sensory exam is normal to light touch and all tested  dermatomes bilaterally.   IMAGING STUDIES:  I reviewed the patient's head CTs performed today at [facility]  I do not see any shunt disconnections, fractures of the catheter,  etc.   Reviewed the patient's cranial CT scan  performed at [facility] on  06/12/02 without contrast.  It demonstrates enlarged ventricles compared  with her 06/03 scan consistent with a shunt malfunction.   ASSESSMENT/PLAN:  Ventricular peritoneal shunt malfunction.  I discussed the  situation with the patient as well as her father and husband, as requested  by the patient.  I told them that her shunt appears to have malfunctioned  again.  I discussed the issue with her and recommended she undergo a  ventricular peritoneal shunt revision.  I described the procedure as well as  the risks including risks of anesthesia, hemorrhage, stroke, seizures,  infection, another shunt malfunction, death, etc.  I also discussed the term  do nothing, which I do not recommend.  I have answered all of their  questions.  Her husband is somewhat hostile at this point but has consented  to the procedure.  I answered all of his questions as well.   The patient has weighed the risks, benefits, alternatives of surgery and  elected to proceed with the operation and we will do it as soon as possible,  i.e. tonight.     Cristi Loron, M.D.                  Cristi Loron, M.D.    JDJ/MEDQ  D:  06/12/2002  T:  06/15/2002  Job:  [id number]

## 2011-06-21 ENCOUNTER — Inpatient Hospital Stay (HOSPITAL_COMMUNITY)
Admission: EM | Admit: 2011-06-21 | Discharge: 2011-06-23 | DRG: 737 | Disposition: A | Discharge status: Home / Self Care | Payer: BC Managed Care – PPO | Attending: Neurosurgery | Admitting: Neurosurgery

## 2011-06-21 ENCOUNTER — Emergency Department (HOSPITAL_COMMUNITY): Payer: BC Managed Care – PPO

## 2011-06-21 DIAGNOSIS — G911 Obstructive hydrocephalus: Secondary | ICD-10-CM | POA: Diagnosis present

## 2011-06-21 DIAGNOSIS — Y838 Other surgical procedures as the cause of abnormal reaction of the patient, or of later complication, without mention of misadventure at the time of the procedure: Secondary | ICD-10-CM | POA: Diagnosis present

## 2011-06-21 DIAGNOSIS — Y92009 Unspecified place in unspecified non-institutional (private) residence as the place of occurrence of the external cause: Secondary | ICD-10-CM

## 2011-06-21 DIAGNOSIS — T85695A Other mechanical complication of other nervous system device, implant or graft, initial encounter: Principal | ICD-10-CM | POA: Diagnosis present

## 2011-06-21 LAB — BASIC METABOLIC PANEL WITH GFR
BUN: 15 mg/dL (ref 6–23)
CO2: 28 meq/L (ref 19–32)
Calcium: 9.1 mg/dL (ref 8.4–10.5)
Chloride: 108 meq/L (ref 96–112)
Creatinine, Ser: 0.53 mg/dL (ref 0.50–1.10)
GFR calc Af Amer: >60 mL/min
GFR calc non Af Amer: >60 mL/min
Glucose, Bld: 93 mg/dL (ref 70–99)
Potassium: 3.4 meq/L — ABNORMAL LOW (ref 3.5–5.1)
Sodium: 142 meq/L (ref 135–145)

## 2011-06-21 LAB — DIFFERENTIAL
Basophils Relative: 1 % (ref 0–1)
Eosinophils Absolute: 0 10*3/uL (ref 0.0–0.7)
Lymphs Abs: 1.4 10*3/uL (ref 0.7–4.0)
Monocytes Absolute: 0.7 10*3/uL (ref 0.1–1.0)
Monocytes Relative: 6 % (ref 3–12)

## 2011-06-21 LAB — CBC
HCT: 41.4 % (ref 36.0–46.0)
Hemoglobin: 14.2 g/dL (ref 12.0–15.0)
MCH: 29.5 pg (ref 26.0–34.0)
MCHC: 34.3 g/dL (ref 30.0–36.0)
MCV: 85.9 fL (ref 78.0–100.0)
Platelets: 186 K/uL (ref 150–400)
RBC: 4.82 MIL/uL (ref 3.87–5.11)
RDW: 13.3 % (ref 11.5–15.5)
WBC: 11.1 K/uL — ABNORMAL HIGH (ref 4.0–10.5)

## 2011-06-22 LAB — PROTIME-INR: Prothrombin Time: 12.9 seconds (ref 11.6–15.2)

## 2011-06-24 NOTE — Consult Note (Signed)
  NAMEJACKILYN, [name] NO.:  [id number]  MEDICAL RECORD NO.:  [id number]  LOCATION:  3112                         FACILITY:  [facility]  PHYSICIAN:  Cristi Loron, M.D.DATE OF BIRTH:  1976-08-04  DATE OF CONSULTATION:  06/21/2011 DATE OF DISCHARGE:                                CONSULTATION   CHIEF COMPLAINT:  Lethargy.  HISTORY OF PRESENT ILLNESS:  The patient is a 35 year old immigrant from [place] who I put a ventriculoperitoneal shunt in about 9 years ago.  The patient has done well, but today developed increasing headaches, lethargy, nausea, and vomiting.  The patient was brought to [facility] where she was evaluated by Dr. Patria Mane to include a head CT scan, it demonstrated the patient had enlarging ventricles consistent with a VP shunt malfunction.  I discussed the situation with the patient's husband on the patient's behalf.  PAST MEDICAL HISTORY:  Negative except for hydrocephalus as above.  PAST SURGICAL HISTORY:  Placement of ventriculoperitoneal shunt.  MEDICATIONS PRIOR TO ADMISSION:  None.  ALLERGIES:  She has no known drug allergies.  FAMILY MEDICAL HISTORY:  Noncontributory.  SOCIAL HISTORY:  She does not smoke tobacco, drink alcohol, or use drugs.  She is married.  She has a 101-year-old son.  REVIEW OF SYSTEMS:  Unobtainable.  PHYSICAL EXAMINATION:  GENERAL:  A 35 year old lethargic female in no apparent distress. HEENT:  Normocephalic.  This patient's pupils are approximately 3 to 4 mm reactive both eyes.  I cannot get it clock rate with extraocular muscle exam.  Oropharynx benign.  She has a right retroauricular ventriculoperitoneal shunt hardware.  No signs of infection. NECK:  Supple.  Thorax is symmetric. ABDOMEN:  Soft, obese. EXTREMITIES:  No obvious deformities. BACK:  Unremarkable. NEUROLOGIC:  The patient has Glasgow coma scale of 90 V2, N5, V2.  She is very somnolent, but will arouse to painful  stimuli.  She localizes and mumbles a bit.  She is moving all extremities.  Her pupils as above are equal.  Deep tendon reflexes are symmetric.  I reviewed the patient's head CT performed without contrast at [facility] on June 21, 2011, and compared with a prior study back in 2008.  It demonstrates that the patient's ventricles are significantly enlarged.  She has a right retroauricular ventriculoperitoneal shunt in good position.  ASSESSMENT/PLAN:  Hydrocephalus, ventriculoperitoneal shunt malfunction. I discussed the situation with the patient's husband.  I told her it appears that her ventriculoperitoneal shunt has malfunctioned. Discussed various treatments and I recommended she undergo revision of ventriculoperitoneal shunt.  I explained the procedure and we discussed the risks including risks of anesthesia, hemorrhage, infection, shunt malplacement, malfunction, seizures, stroke, medical risk, etc.  I have answered all the patient's husband's questions.  He has weighed the risks, benefits, alternatives of surgery on behalf of the patient and consented for her with this as soon as possible.     Cristi Loron, M.D.     JDJ/MEDQ  D:  06/21/2011  T:  06/22/2011  Job:  [id number]  Electronically Signed by Tressie Stalker M.D. on 06/24/2011 09:06:08 AM

## 2011-07-05 NOTE — Op Note (Signed)
[name], ROTHMAN NO.:  [id number]  MEDICAL RECORD NO.:  [id number]  LOCATION:                                 FACILITY:  PHYSICIAN:  Cristi Loron, M.D.DATE OF BIRTH:  1976/02/01  DATE OF PROCEDURE:  06/22/2011 DATE OF DISCHARGE:                              OPERATIVE REPORT   BRIEF HISTORY:  The patient is a 35 year old female who has had placement of a ventriculoperitoneal shunt with multiple revisions.  Her last revision was done about 9 years ago.  The patient had been doing well until today where she became lethargic.  She was brought to the emergency department, worked up with a head CT scan which demonstrated enlarging ventricles.  I discussed situation with the patient's husband and recommended she undergo a ventriculoperitoneal shunt revision.  I explained the surgery and risks, benefits, and alternatives.  I have answered all of his questions.  He has consented on behalf of the patient.  PREOPERATIVE DIAGNOSIS:  Ventriculoperitoneal shunt malfunction.  POSTOPERATIVE DIAGNOSIS:  Ventriculoperitoneal shunt malfunction.  PROCEDURE:  Ventriculoperitoneal shunt revision (placement of the ventricular catheter with a 9-cm Medtronic snap-on ventricular catheter).  SURGEON:  Cristi Loron, MD  ASSISTANT:  None.  ANESTHESIA:  General endotracheal.  ESTIMATED BLOOD LOSS:  Minimal.  SPECIMENS:  None.  DRAINS:  None.  COMPLICATIONS:  None.  DESCRIPTION OF THE PROCEDURE:  The patient was brought to the operating room by the anesthesia team.  General endotracheal anesthesia was induced.  The patient remained in supine position.  A roll was placed under her right shoulder.  Her head was turned to the left.  Her right retroauricular region was then shaved with clippers.  This region as well as her neck, thorax, and abdomen were then prepared with Betadine scrub, Betadine solution.  Sterile drapes were applied and I then injected the  area to be incised with Marcaine with epinephrine solution. I used a scalpel to make an incision through the patient's previous retroauricular incision.  We used a Horticulturist, commercial for exposure.  I used electrocautery to expose the Rickham reservoir of her old ventriculoperitoneal shunt.  I then elevated the reservoir and disconnected the old snap-on catheter.  Upon doing this, there was no release of spinal fluid indicative of a proximal ventriculoperitoneal shunt malfunction.  To be on safe side, I injected bacitracin solution distally through the valve into the peritoneal catheter, it was very easy to inject indicating the valve and distal catheter were patent.  I then removed the old ventricular catheter and placed a new one.  Upon doing this, there was release of spinal fluid under high pressure.  We then connected the new ventricular catheter to the snap on assembly, then irrigated the wound out with bacitracin solution and then removed the retractor and reapproximated the patient's galea with interrupted 2- 0 Vicryl suture and the skin with stainless staples and the wound was then coated with bacitracin ointment and sterile dressing applied.  The drapes were removed, and the patient was subsequently extubated by the anesthesia team and transported to the post anesthesia care unit in stable condition.  All sponge, instrument, needle counts were correct at the end of  the case.     Cristi Loron, M.D.     JDJ/MEDQ  D:  06/22/2011  T:  06/22/2011  Job:  [id number]  Electronically Signed by Tressie Stalker M.D. on 07/05/2011 09:40:47 AM

## 2011-07-15 NOTE — Discharge Summary (Signed)
  NAMEKYARAH, [name] NO.:  [id number]  MEDICAL RECORD NO.:  [id number]  LOCATION:                                 FACILITY:  PHYSICIAN:  Cristi Loron, M.D.DATE OF BIRTH:  1975-10-16  DATE OF ADMISSION: DATE OF DISCHARGE:                              DISCHARGE SUMMARY   BRIEF HISTORY:  The patient is a 35 year old white female who has had placement of a ventriculoperitoneal shunt by another physician with multiple revisions.  Her last revision was done about 9 years ago.  The patient was doing well until today when she began lethargic.  She was brought to the emergency department and worked up with a head CT which demonstrated enlarging ventricles.  I discussed situation with the patient's husband and recommend she undergo a revision of her ventriculoperitoneal shunt.  I have explained the surgery of risks, benefits, alternatives.  I have answered all his questions.  He is consented on behalf the patient.  For further details of this admission, please refer to typed history and physical.  HOSPITAL COURSE:  I admitted the patient to [facility] on June 22, 2011, on day of admission performed a revision of ventriculoperitoneal shunt (I replaced her ventricular catheter with a 9 cm Medtronic snap on ventricular catheter).  The surgery went well (for further details of this operation, please refer to typed operative note).  POSTOP COURSE:  The patient's postoperative course was unremarkable and she was discharged to home on June 23, 2011.  DISCHARGE INSTRUCTIONS:  The patient was given oral and written discharge structures.  Her questions were answered.  She was instructed to follow up with me in approximately 10 days for staple removal.  FINAL DIAGNOSIS:  Ventriculoperitoneal shunt function.  Procedure performed is ventriculoperitoneal shunt revision.     Cristi Loron, M.D.     JDJ/MEDQ  D:  07/05/2011  T:   07/05/2011  Job:  [id number]  Electronically Signed by Tressie Stalker M.D. on 07/15/2011 04:57:45 PM

## 2011-08-21 ENCOUNTER — Other Ambulatory Visit: Payer: Self-pay | Admitting: Neurosurgery

## 2011-08-21 DIAGNOSIS — G911 Obstructive hydrocephalus: Secondary | ICD-10-CM

## 2011-08-22 ENCOUNTER — Ambulatory Visit
Admission: RE | Admit: 2011-08-22 | Discharge: 2011-08-22 | Disposition: A | Discharge status: Home / Self Care | Payer: BC Managed Care – PPO | Source: Ambulatory Visit | Attending: Neurosurgery | Admitting: Neurosurgery

## 2011-08-22 DIAGNOSIS — G911 Obstructive hydrocephalus: Secondary | ICD-10-CM

## 2012-03-06 ENCOUNTER — Ambulatory Visit (INDEPENDENT_AMBULATORY_CARE_PROVIDER_SITE_OTHER): Payer: BC Managed Care – PPO | Admitting: Family Medicine

## 2012-03-06 VITALS — BP 133/79 | HR 67 | Temp 98.0°F | Resp 16 | Ht 67.0 in | Wt 164.0 lb

## 2012-03-06 DIAGNOSIS — R319 Hematuria, unspecified: Secondary | ICD-10-CM

## 2012-03-06 DIAGNOSIS — N3091 Cystitis, unspecified with hematuria: Secondary | ICD-10-CM

## 2012-03-06 DIAGNOSIS — R350 Frequency of micturition: Secondary | ICD-10-CM

## 2012-03-06 LAB — POCT UA - MICROSCOPIC ONLY
Mucus, UA: POSITIVE
Yeast, UA: NEGATIVE

## 2012-03-06 LAB — POCT URINALYSIS DIPSTICK
Bilirubin, UA: NEGATIVE
Ketones, UA: NEGATIVE
Protein, UA: >=300
Spec Grav, UA: 1.025

## 2012-03-06 MED ORDER — SULFAMETHOXAZOLE-TRIMETHOPRIM 800-160 MG PO TABS: 1.0000 | ORAL_TABLET | Freq: Two times a day (BID) | ORAL | Status: DC

## 2012-03-06 NOTE — Progress Notes (Signed)
  Subjective: Dysuria for 2 day s. No fever. No back pain. Had 1 mild UTI couple of years ago.  Objective: No CVA tenderness. No suprapubic tenderness  Results for orders placed in visit on 03/06/12  POCT URINALYSIS DIPSTICK      Component Value Range   Color, UA Brown     Clarity, UA Turbid     Glucose, UA neg     Bilirubin, UA neg     Ketones, UA neg     Spec Grav, UA 1.025     Blood, UA large     pH, UA 7.0     Protein, UA >=300     Urobilinogen, UA 1.0     Nitrite, UA positive     Leukocytes, UA moderate (2+)    POCT UA - MICROSCOPIC ONLY      Component Value Range   WBC, Ur, HPF, POC TNTC     RBC, urine, microscopic TNTC     Bacteria, U Microscopic 2+     Mucus, UA positive     Epithelial cells, urine per micros 0-1     Crystals, Ur, HPF, POC neg     Casts, Ur, LPF, POC neg     Yeast, UA neg     Assessment: UTI, hemorrhagic cystitis  Plan: Bactrim DS for 1 wk

## 2012-03-06 NOTE — Patient Instructions (Signed)

## 2012-03-09 LAB — URINE CULTURE

## 2012-03-17 ENCOUNTER — Ambulatory Visit (INDEPENDENT_AMBULATORY_CARE_PROVIDER_SITE_OTHER): Payer: BC Managed Care – PPO | Admitting: Physician Assistant

## 2012-03-17 ENCOUNTER — Encounter: Payer: Self-pay | Admitting: Physician Assistant

## 2012-03-17 VITALS — BP 105/68 | HR 66 | Temp 97.4°F | Resp 16 | Ht 63.0 in | Wt 159.8 lb

## 2012-03-17 DIAGNOSIS — Z0000 Encounter for general adult medical examination without abnormal findings: Secondary | ICD-10-CM

## 2012-03-17 DIAGNOSIS — N6009 Solitary cyst of unspecified breast: Secondary | ICD-10-CM | POA: Insufficient documentation

## 2012-03-17 DIAGNOSIS — Z124 Encounter for screening for malignant neoplasm of cervix: Secondary | ICD-10-CM

## 2012-03-17 DIAGNOSIS — N3090 Cystitis, unspecified without hematuria: Secondary | ICD-10-CM

## 2012-03-17 DIAGNOSIS — N3091 Cystitis, unspecified with hematuria: Secondary | ICD-10-CM

## 2012-03-17 DIAGNOSIS — Z23 Encounter for immunization: Secondary | ICD-10-CM

## 2012-03-17 LAB — POCT CBC
Lymph, poc: 1.9 (ref 0.6–3.4)
MCH, POC: 28.8 pg (ref 27–31.2)
MCHC: 32.9 g/dL (ref 31.8–35.4)
MCV: 87.5 fL (ref 80–97)
MID (cbc): 0.4 (ref 0–0.9)
MPV: 10.9 fL (ref 0–99.8)
POC LYMPH PERCENT: 40.7 %L (ref 10–50)
POC MID %: 7.8 %M (ref 0–12)
Platelet Count, POC: 214 10*3/uL (ref 142–424)
RDW, POC: 13.9 %
WBC: 4.6 10*3/uL (ref 4.6–10.2)

## 2012-03-17 LAB — LIPID PANEL
Cholesterol: 174 mg/dL (ref 0–200)
HDL: 66 mg/dL (ref >39)
Total CHOL/HDL Ratio: 2.6 Ratio
VLDL: 9 mg/dL (ref 0–40)

## 2012-03-17 LAB — COMPREHENSIVE METABOLIC PANEL
BUN: 16 mg/dL (ref 6–23)
CO2: 28 mEq/L (ref 19–32)
Calcium: 9.1 mg/dL (ref 8.4–10.5)
Chloride: 104 mEq/L (ref 96–112)
Creat: 0.81 mg/dL (ref 0.50–1.10)
Total Bilirubin: 0.8 mg/dL (ref 0.3–1.2)

## 2012-03-17 LAB — POCT URINALYSIS DIPSTICK
Ketones, UA: NEGATIVE
Leukocytes, UA: NEGATIVE
Protein, UA: NEGATIVE
Urobilinogen, UA: 0.2
pH, UA: 5.5

## 2012-03-17 LAB — POCT UA - MICROSCOPIC ONLY
Casts, Ur, LPF, POC: NEGATIVE
Crystals, Ur, HPF, POC: NEGATIVE

## 2012-03-17 LAB — TSH: TSH: 1.789 u[IU]/mL (ref 0.350–4.500)

## 2012-03-17 NOTE — Patient Instructions (Addendum)
Keeping You Healthy  Get These Tests 1. Blood Pressure- Have your blood pressure checked once a year by your health care provider.  Normal blood pressure is 120/80. 2. Weight- Have your body mass index (BMI) calculated to screen for obesity.  BMI is measure of body fat based on height and weight.  You can also calculate your own BMI at https://www.west-esparza.com/. 3. Cholesterol- Have your cholesterol checked every 5 years starting at age 38 then yearly starting at age 4. 4. Chlamydia, HIV, and other sexually transmitted diseases- Get screened every year until age 39, then within three months of each new sexual provider. 5. Pap Smear- Every 1-3 years; discuss with your health care provider. 6. Mammogram- Every year starting at age 104 (However, you should have your follow-up exam in September, as planned).  Take these medicines  Calcium with Vitamin D-Your body needs 1200 mg of Calcium each day and [phone] IU of Vitamin D daily.  Your body can only absorb 500 mg of Calcium at a time so Calcium must be taken in 2 or 3 divided doses throughout the day.  Multivitamin with folic acid- Once daily if it is possible for you to become pregnant.  Get these Immunizations  Gardasil-Series of three doses; prevents HPV related illness such as genital warts and cervical cancer, recommended for those ages 9-26 years.  Menactra-Single dose; prevents meningitis.  Tetanus shot- Every 10 years.  Flu shot-Every year.  Take these steps 1. Do not smoke-Your healthcare provider can help you quit.  For tips on how to quit go to www.smokefree.gov or call 1-800 QUITNOW. 2. Be physically active- Exercise 5 days a week for at least 30 minutes.  If you are not already physically active, start slow and gradually work up to 30 minutes of moderate physical activity.  Examples of moderate activity include walking briskly, dancing, swimming, bicycling, etc. 3. Breast Cancer- A self breast exam every month is important for  early detection of breast cancer.  For more information and instruction on self breast exams, ask your healthcare provider or [employer]. 4. Eat a healthy diet- Eat a variety of healthy foods such as fruits, vegetables, whole grains, low fat milk, low fat cheeses, yogurt, lean meats, poultry and fish, beans, nuts, tofu, etc.  For more information go to www. Thenutritionsource.org 5. Drink alcohol in moderation- Limit alcohol intake to one drink or less per day. Never drink and drive. 6. Depression- Your emotional health is as important as your physical health.  If you're feeling down or losing interest in things you normally enjoy please talk to your healthcare provider about being screened for depression. 7. Dental visit- Brush and floss your teeth twice daily; visit your dentist twice a year. 8. Eye doctor- Get an eye exam at least every 2 years. 9. Helmet use- Always wear a helmet when riding a bicycle, motorcycle, rollerblading or skateboarding. 10. Safe sex- If you may be exposed to sexually transmitted infections, use a condom. 11. Seat belts- Seat belts can save your live; always wear one. 12. Smoke/Carbon Monoxide detectors- These detectors need to be installed on the appropriate level of your home. Replace batteries at least once a year. 13. Skin cancer- When out in the sun please cover up and use sunscreen 15 SPF or higher. 14. Violence- If anyone is threatening or hurting you, please tell your healthcare provider.   I suspect that the blood in your urine is from your menstrual bleeding and will resolve.  However, it is important  to re-check when you are NOT bleeding.

## 2012-03-17 NOTE — Progress Notes (Signed)
Subjective:    Patient ID: [name], female    DOB: January 17, 1976, 36 y.o.   MRN: [id number]  HPI Presents for CPE.  Last exam 12 months ago. No history of abnormal pap.  Has a cyst in the upper outer aspect of the right breast, already evaluated, with scheduled follow-up imaging in September. From [place], in the [place] x 12 years.  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. Husband here as well.    [id number] (1 elective termination, 1 miscarriage).  1 living son is 5 years old.  Review of Systems  Constitutional: Negative.   HENT: Negative.   Eyes: Negative.   Respiratory: Negative.   Cardiovascular: Negative.   Gastrointestinal: Negative.   Genitourinary: Negative.   Musculoskeletal: Negative.   Skin: Negative.   Neurological: Negative.   Hematological: Negative.   Psychiatric/Behavioral: Negative.        Objective:   Physical Exam  Vitals reviewed. Constitutional: She is oriented to person, place, and time. Vital signs are normal. She appears well-developed and well-nourished. No distress.  HENT:  Head: Normocephalic and atraumatic.  Right Ear: Hearing, tympanic membrane, external ear and ear canal normal. No foreign bodies.  Left Ear: Hearing, tympanic membrane, external ear and ear canal normal. No foreign bodies.  Nose: Nose normal.  Mouth/Throat: Uvula is midline, oropharynx is clear and moist and mucous membranes are normal. No oral lesions. Normal dentition. No dental abscesses or uvula swelling. No oropharyngeal exudate.  Eyes: Conjunctivae and EOM are normal. Pupils are equal, round, and reactive to light. Right eye exhibits no discharge. Left eye exhibits no discharge. No scleral icterus.  Fundoscopic exam:      The right eye shows no arteriolar narrowing, no AV nicking, no exudate, no hemorrhage and no papilledema. The right eye shows red reflex.The right eye shows no venous pulsations.      The left eye shows no arteriolar narrowing, no AV nicking, no  exudate, no hemorrhage and no papilledema. The left eye shows red reflex.The left eye shows no venous pulsations. Neck: Trachea normal, normal range of motion and full passive range of motion without pain. Neck supple. No spinous process tenderness and no muscular tenderness present. No mass and no thyromegaly present.  Cardiovascular: Normal rate, regular rhythm, normal heart sounds, intact distal pulses and normal pulses.   Pulmonary/Chest: Effort normal and breath sounds normal. She exhibits no tenderness and no retraction. Right breast exhibits no inverted nipple, no mass, no nipple discharge, no skin change and no tenderness. Left breast exhibits no inverted nipple, no mass, no nipple discharge, no skin change and no tenderness. Breasts are symmetrical.  Abdominal: Soft. Normal appearance and bowel sounds are normal. She exhibits no distension and no mass. There is no hepatosplenomegaly. There is no tenderness. There is no rigidity, no rebound, no guarding, no CVA tenderness, no tenderness at McBurney's point and negative Murphy's sign. No hernia. Hernia confirmed negative in the right inguinal area and confirmed negative in the left inguinal area.  Genitourinary: Rectum normal, vagina normal and uterus normal. Rectal exam shows no external hemorrhoid and no fissure. No breast swelling, tenderness, discharge or bleeding. Pelvic exam was performed with patient supine. No labial fusion. There is no rash, tenderness, lesion or injury on the right labia. There is no rash, tenderness, lesion or injury on the left labia. Cervix exhibits no motion tenderness, no discharge and no friability. Right adnexum displays no mass, no tenderness and no fullness. Left adnexum displays no  mass, no tenderness and no fullness. No erythema, tenderness or bleeding around the vagina. No foreign body around the vagina. No signs of injury around the vagina. No vaginal discharge found.  Musculoskeletal: She exhibits no edema and no  tenderness.       Cervical back: Normal.       Thoracic back: Normal.       Lumbar back: Normal.  Lymphadenopathy:       Head (right side): No tonsillar, no preauricular, no posterior auricular and no occipital adenopathy present.       Head (left side): No tonsillar, no preauricular, no posterior auricular and no occipital adenopathy present.    She has no cervical adenopathy.    She has no axillary adenopathy.       Right: No inguinal and no supraclavicular adenopathy present.       Left: No inguinal and no supraclavicular adenopathy present.  Neurological: She is alert and oriented to person, place, and time. She has normal strength and normal reflexes. No cranial nerve deficit. She exhibits normal muscle tone. Coordination and gait normal.  Skin: Skin is warm, dry and intact. No rash noted. She is not diaphoretic. No cyanosis or erythema. Nails show no clubbing.  Psychiatric: She has a normal mood and affect. Her speech is normal and behavior is normal. Judgment and thought content normal.   Results for orders placed in visit on 03/17/12  POCT CBC      Component Value Range   WBC 4.6  4.6 - 10.2 (K/uL)   Lymph, poc 1.9  0.6 - 3.4    POC LYMPH PERCENT 40.7  10 - 50 (%L)   MID (cbc) 0.4  0 - 0.9    POC MID % 7.8  0 - 12 (%M)   POC Granulocyte 2.4  2 - 6.9    Granulocyte percent 51.5  37 - 80 (%G)   RBC 4.82  4.04 - 5.48 (M/uL)   Hemoglobin 13.9  12.2 - 16.2 (g/dL)   HCT, POC 40.9  81.1 - 47.9 (%)   MCV 87.5  80 - 97 (fL)   MCH, POC 28.8  27 - 31.2 (pg)   MCHC 32.9  31.8 - 35.4 (g/dL)   RDW, POC 91.4     Platelet Count, POC 214  142 - 424 (K/uL)   MPV 10.9  0 - 99.8 (fL)  GLUCOSE, POCT (MANUAL RESULT ENTRY)      Component Value Range   POC Glucose 87  70 - 99 (mg/dl)  POCT UA - MICROSCOPIC ONLY      Component Value Range   WBC, Ur, HPF, POC 0-1     RBC, urine, microscopic 6-12     Bacteria, U Microscopic 1+     Mucus, UA positive     Epithelial cells, urine per micros 4-7      Crystals, Ur, HPF, POC neg     Casts, Ur, LPF, POC neg     Yeast, UA neg    POCT URINALYSIS DIPSTICK      Component Value Range   Color, UA dk yellow     Clarity, UA cloudy     Glucose, UA neg     Bilirubin, UA neg     Ketones, UA neg     Spec Grav, UA 1.025     Blood, UA large     pH, UA 5.5     Protein, UA neg     Urobilinogen, UA 0.2  Nitrite, UA neg     Leukocytes, UA Negative         Assessment & Plan:   1. Routine general medical examination at a health care facility  POCT CBC, POCT glucose (manual entry), POCT UA - Microscopic Only, POCT urinalysis dipstick, TSH, Comprehensive metabolic panel, Lipid panel  2. Breast cyst    3. Screening for cervical cancer  Pap IG and HPV (high risk) DNA detection  4. Need for Tdap vaccination  Tdap vaccine greater than or equal to 7yo IM  5. Cystitis with hematuria  Suspect the hematuria seen today is due to her recent menses (03/14/2012).  Re-check in 2 weeks, when she's not bleeding, anticipate resolution.  6. Cyst of breast  Follow-up imaging in 06/2012 as planned.

## 2012-03-18 LAB — PAP IG AND HPV HIGH-RISK

## 2012-03-19 ENCOUNTER — Encounter: Payer: Self-pay | Admitting: Physician Assistant

## 2013-02-03 ENCOUNTER — Encounter (HOSPITAL_COMMUNITY): Payer: Self-pay | Admitting: *Deleted

## 2013-02-03 ENCOUNTER — Emergency Department (HOSPITAL_COMMUNITY)
Admission: EM | Admit: 2013-02-03 | Discharge: 2013-02-03 | Disposition: A | Discharge status: Home / Self Care | Payer: BC Managed Care – PPO | Attending: Emergency Medicine | Admitting: Emergency Medicine

## 2013-02-03 ENCOUNTER — Emergency Department (HOSPITAL_COMMUNITY): Payer: BC Managed Care – PPO

## 2013-02-03 DIAGNOSIS — Z982 Presence of cerebrospinal fluid drainage device: Secondary | ICD-10-CM | POA: Insufficient documentation

## 2013-02-03 DIAGNOSIS — R51 Headache: Secondary | ICD-10-CM

## 2013-02-03 DIAGNOSIS — R112 Nausea with vomiting, unspecified: Secondary | ICD-10-CM | POA: Insufficient documentation

## 2013-02-03 HISTORY — DX: Presence of cerebrospinal fluid drainage device: Z98.2

## 2013-02-03 MED ORDER — MORPHINE SULFATE 4 MG/ML IJ SOLN
6.0000 mg | Freq: Once | INTRAMUSCULAR | Status: AC
  Administered 2013-02-03: 6 mg via INTRAVENOUS
  Filled 2013-02-03: qty 2

## 2013-02-03 MED ORDER — METOCLOPRAMIDE HCL 5 MG/ML IJ SOLN
10.0000 mg | Freq: Once | INTRAMUSCULAR | Status: AC
  Administered 2013-02-03: 10 mg via INTRAVENOUS
  Filled 2013-02-03: qty 2

## 2013-02-03 NOTE — ED Provider Notes (Addendum)
History     CSN: [id number]  Arrival date & time 02/03/13  1020   First MD Initiated Contact with Patient 02/03/13 1050      Chief Complaint  Patient presents with  . Headache  . Nausea  . Emesis   history is obtained from professional interpreter using language line. Patient speaks no English  (Consider location/radiation/quality/duration/timing/severity/associated sxs/prior treatment) HPI Complains of right-sided parietal headache gradual onset throbbing in nature upon awakening this morning accompanied by several episodes of vomiting. No fever no visual changes no other complaint no treatment prior to coming here headache has improved with time. She's had similar headaches in the past when "I had too much fluid on my brain" the treatment prior to coming here headache improved presently. Continues to complain of nausea Past Medical History  Diagnosis Date  . VP (ventriculoperitoneal) shunt status    Past Surgical History  Procedure Laterality Date  . Shunt      brain    Family History  Problem Relation Age of Onset  . Diabetes Father     History  Substance Use Topics  . Smoking status: Never Smoker   . Smokeless tobacco: Never Used  . Alcohol Use: No    OB History   Grav Para Term Preterm Abortions TAB SAB Ect Mult Living                  Review of Systems  Constitutional: Negative.   Respiratory: Negative.   Cardiovascular: Negative.   Gastrointestinal: Positive for nausea and vomiting.  Musculoskeletal: Negative.   Skin: Negative.   Neurological: Positive for headaches.  Psychiatric/Behavioral: Negative.   All other systems reviewed and are negative.    Allergies  Review of patient's allergies indicates no known allergies.  Home Medications  No current outpatient prescriptions on file.  BP 125/75  Pulse 81  Temp(Src) 97.7 F (36.5 C) (Oral)  Resp 20  SpO2 99%  Physical Exam  Nursing note and vitals reviewed. Constitutional: She is  oriented to person, place, and time. She appears well-developed and well-nourished.  HENT:  Head: Normocephalic and atraumatic.  Eyes: Conjunctivae are normal. Pupils are equal, round, and reactive to light.  Fundi benign  Neck: Neck supple. No tracheal deviation present. No thyromegaly present.  Cardiovascular: Normal rate and regular rhythm.   No murmur heard. Pulmonary/Chest: Effort normal and breath sounds normal.  Abdominal: Soft. Bowel sounds are normal. She exhibits no distension. There is no tenderness.  Musculoskeletal: Normal range of motion. She exhibits no edema and no tenderness.  Neurological: She is alert and oriented to person, place, and time. She has normal reflexes. Coordination normal.  Gait normal Romberg normal pronator drift normal  Skin: Skin is warm and dry. No rash noted.  Psychiatric: She has a normal mood and affect.    ED Course  Procedures (including critical care time)  Labs Reviewed - No data to display No results found. Results for orders placed in visit on 03/17/12  TSH      Result Value Range   TSH 1.789  0.350 - 4.500 uIU/mL  COMPREHENSIVE METABOLIC PANEL      Result Value Range   Sodium 139  135 - 145 mEq/L   Potassium 4.0  3.5 - 5.3 mEq/L   Chloride 104  96 - 112 mEq/L   CO2 28  19 - 32 mEq/L   Glucose, Bld 85  70 - 99 mg/dL   BUN 16  6 - 23 mg/dL   Creat  0.81  0.50 - 1.10 mg/dL   Total Bilirubin 0.8  0.3 - 1.2 mg/dL   Alkaline Phosphatase 45  39 - 117 U/L   AST 24  0 - 37 U/L   ALT 27  0 - 35 U/L   Total Protein 7.0  6.0 - 8.3 g/dL   Albumin 4.2  3.5 - 5.2 g/dL   Calcium 9.1  8.4 - 40.9 mg/dL  LIPID PANEL      Result Value Range   Cholesterol 174  0 - 200 mg/dL   Triglycerides 46  <811 mg/dL   HDL 66  >91 mg/dL   Total CHOL/HDL Ratio 2.6     VLDL 9  0 - 40 mg/dL   LDL Cholesterol 99  0 - 99 mg/dL  POCT CBC      Result Value Range   WBC 4.6  4.6 - 10.2 K/uL   Lymph, poc 1.9  0.6 - 3.4   POC LYMPH PERCENT 40.7  10 - 50 %L    MID (cbc) 0.4  0 - 0.9   POC MID % 7.8  0 - 12 %M   POC Granulocyte 2.4  2 - 6.9   Granulocyte percent 51.5  37 - 80 %G   RBC 4.82  4.04 - 5.48 M/uL   Hemoglobin 13.9  12.2 - 16.2 g/dL   HCT, POC 47.8  29.5 - 47.9 %   MCV 87.5  80 - 97 fL   MCH, POC 28.8  27 - 31.2 pg   MCHC 32.9  31.8 - 35.4 g/dL   RDW, POC 62.1     Platelet Count, POC 214  142 - 424 K/uL   MPV 10.9  0 - 99.8 fL  GLUCOSE, POCT (MANUAL RESULT ENTRY)      Result Value Range   POC Glucose 87  70 - 99 mg/dl  POCT UA - MICROSCOPIC ONLY      Result Value Range   WBC, Ur, HPF, POC 0-1     RBC, urine, microscopic 6-12     Bacteria, U Microscopic 1+     Mucus, UA positive     Epithelial cells, urine per micros 4-7     Crystals, Ur, HPF, POC neg     Casts, Ur, LPF, POC neg     Yeast, UA neg    POCT URINALYSIS DIPSTICK      Result Value Range   Color, UA dk yellow     Clarity, UA cloudy     Glucose, UA neg     Bilirubin, UA neg     Ketones, UA neg     Spec Grav, UA 1.025     Blood, UA large     pH, UA 5.5     Protein, UA neg     Urobilinogen, UA 0.2     Nitrite, UA neg     Leukocytes, UA Negative    PAP IG AND HPV HIGH-RISK      Result Value Range   HPV DNA High Risk NOT DETECTED     Specimen adequacy:       FINAL DIAGNOSIS:       COMMENTS:       Cytotechnologist:       Dg Chest 1 View  02/03/2013  *RADIOLOGY REPORT*  Clinical Data: Check shunt.  CHEST - 1 VIEW  Comparison: 06/21/2011  Findings: Right sided ventricular shunt catheters again noted. This loops over the right chest with the tip projecting over the right lateral  hemithorax, similar to prior study.  Heart is normal size.  Lungs are clear.  No pleural effusions or acute bony abnormality.  IMPRESSION: Ventricular shunt catheter again projects over the right lateral hemithorax, similar to prior study.   Original Report Authenticated By: Charlett Nose, M.D.    Dg Cervical Spine 1 View  02/03/2013  *RADIOLOGY REPORT*  Clinical Data: Assess shunt  position  DG CERVICAL SPINE - 1 VIEW  Comparison: None.  Findings: Single lateral view of the cervical spine submitted. Alignment and vertebral height are preserved.  No acute fracture or subluxation.  A ventriculoperitoneal shunt is noted within soft tissue neck.  There is no evidence of shunt interruption.  IMPRESSION:  VP shunt is noted within soft tissue neck.  No evidence of shunt interruption.   Original Report Authenticated By: Natasha Mead, M.D.    Ct Head Wo Contrast  02/03/2013  *RADIOLOGY REPORT*  Clinical Data: Headache, nausea.  CT HEAD WITHOUT CONTRAST  Technique:  Contiguous axial images were obtained from the base of the skull through the vertex without contrast.  Comparison: 08/22/2011  Findings: Right parietal VP shunt is in place.  This crosses the midline with the tip projecting over the anterior left lateral ventricle in stable position.  Stable appearance and size of the ventricles.  Left lateral ventricle remains mildly prominent relative to the right, but stable since prior study.  No hemorrhage or acute infarction.  No mass lesion.  No acute calvarial abnormality. Visualized paranasal sinuses and mastoids clear.  Orbital soft tissues unremarkable.  IMPRESSION: Stable right parietal VP shunt and appearance/size of the ventricles.  No acute findings.   Original Report Authenticated By: Charlett Nose, M.D.      No diagnosis found.  12:30 PM headache and nausea much improved after treatment with intervenous Reglan and opioids patient awake alert Glasgow Coma Score 15  MDM  Spoke with Dr.Jenkin's offfice. No evidence of increased intracranial pressure or shunt malfunction. Plan cough is for followup  Diagnosis headache        Doug Sou, MD 02/03/13 1343  Doug Sou, MD 02/03/13 1343

## 2013-02-03 NOTE — ED Notes (Signed)
To ED for eval of n/v and HA since this am. Pt has VP shunt placed in 2012 by Dr Lovell Sheehan. Pt is alert. Appears to not feel well.

## 2013-12-02 ENCOUNTER — Encounter (HOSPITAL_COMMUNITY): Payer: Self-pay | Admitting: Emergency Medicine

## 2013-12-02 ENCOUNTER — Emergency Department (HOSPITAL_COMMUNITY): Payer: Medicaid Other

## 2013-12-02 ENCOUNTER — Emergency Department (HOSPITAL_COMMUNITY)
Admission: EM | Admit: 2013-12-02 | Discharge: 2013-12-02 | Disposition: A | Discharge status: Home / Self Care | Payer: Medicaid Other | Attending: Emergency Medicine | Admitting: Emergency Medicine

## 2013-12-02 DIAGNOSIS — Z982 Presence of cerebrospinal fluid drainage device: Secondary | ICD-10-CM | POA: Insufficient documentation

## 2013-12-02 DIAGNOSIS — R110 Nausea: Secondary | ICD-10-CM | POA: Insufficient documentation

## 2013-12-02 DIAGNOSIS — H53149 Visual discomfort, unspecified: Secondary | ICD-10-CM | POA: Insufficient documentation

## 2013-12-02 DIAGNOSIS — R519 Headache, unspecified: Secondary | ICD-10-CM

## 2013-12-02 DIAGNOSIS — R51 Headache: Secondary | ICD-10-CM | POA: Insufficient documentation

## 2013-12-02 DIAGNOSIS — Z79899 Other long term (current) drug therapy: Secondary | ICD-10-CM | POA: Insufficient documentation

## 2013-12-02 MED ORDER — SODIUM CHLORIDE 0.9 % IV BOLUS (SEPSIS)
1000.0000 mL | Freq: Once | INTRAVENOUS | Status: AC
  Administered 2013-12-02: 1000 mL via INTRAVENOUS

## 2013-12-02 MED ORDER — KETOROLAC TROMETHAMINE 30 MG/ML IJ SOLN
30.0000 mg | Freq: Once | INTRAMUSCULAR | Status: AC
  Administered 2013-12-02: 30 mg via INTRAVENOUS
  Filled 2013-12-02: qty 1

## 2013-12-02 MED ORDER — HYDROCODONE-ACETAMINOPHEN 5-325 MG PO TABS: 1.0000 | ORAL_TABLET | ORAL | Status: DC | PRN

## 2013-12-02 MED ORDER — DIPHENHYDRAMINE HCL 50 MG/ML IJ SOLN
25.0000 mg | Freq: Once | INTRAMUSCULAR | Status: AC
  Administered 2013-12-02: 25 mg via INTRAVENOUS
  Filled 2013-12-02: qty 1

## 2013-12-02 MED ORDER — METOCLOPRAMIDE HCL 5 MG/ML IJ SOLN
10.0000 mg | Freq: Once | INTRAMUSCULAR | Status: AC
  Administered 2013-12-02: 10 mg via INTRAVENOUS
  Filled 2013-12-02: qty 2

## 2013-12-02 NOTE — Discharge Instructions (Signed)

## 2013-12-02 NOTE — ED Notes (Signed)
Reports headache starting last night, n/v, pt has shunt-reports had swelling last night near shunt but reports that has resolved today; speech clear, grips equal, face symmetrical; reports normally takes vicodin and helps with pain but does not have anymore

## 2013-12-03 ENCOUNTER — Observation Stay (HOSPITAL_COMMUNITY)
Admission: EM | Admit: 2013-12-03 | Discharge: 2013-12-04 | Disposition: A | Discharge status: Home / Self Care | Payer: Medicaid Other | Attending: Neurosurgery | Admitting: Neurosurgery

## 2013-12-03 ENCOUNTER — Encounter (HOSPITAL_COMMUNITY)
Admission: EM | Disposition: A | Discharge status: Home / Self Care | Payer: Self-pay | Source: Home / Self Care | Attending: Emergency Medicine

## 2013-12-03 ENCOUNTER — Encounter (HOSPITAL_COMMUNITY): Payer: Self-pay | Admitting: Emergency Medicine

## 2013-12-03 ENCOUNTER — Emergency Department (HOSPITAL_COMMUNITY): Payer: Medicaid Other | Admitting: Anesthesiology

## 2013-12-03 ENCOUNTER — Encounter (HOSPITAL_COMMUNITY): Payer: Medicaid Other | Admitting: Anesthesiology

## 2013-12-03 ENCOUNTER — Emergency Department (HOSPITAL_COMMUNITY): Payer: Medicaid Other

## 2013-12-03 DIAGNOSIS — Y849 Medical procedure, unspecified as the cause of abnormal reaction of the patient, or of later complication, without mention of misadventure at the time of the procedure: Secondary | ICD-10-CM | POA: Insufficient documentation

## 2013-12-03 DIAGNOSIS — R112 Nausea with vomiting, unspecified: Secondary | ICD-10-CM | POA: Diagnosis not present

## 2013-12-03 DIAGNOSIS — T85695A Other mechanical complication of other nervous system device, implant or graft, initial encounter: Principal | ICD-10-CM | POA: Insufficient documentation

## 2013-12-03 DIAGNOSIS — R519 Headache, unspecified: Secondary | ICD-10-CM

## 2013-12-03 DIAGNOSIS — T8509XA Other mechanical complication of ventricular intracranial (communicating) shunt, initial encounter: Secondary | ICD-10-CM | POA: Diagnosis present

## 2013-12-03 DIAGNOSIS — R51 Headache: Secondary | ICD-10-CM | POA: Insufficient documentation

## 2013-12-03 HISTORY — PX: SHUNT REVISION VENTRICULAR-PERITONEAL: SHX6094

## 2013-12-03 LAB — CBC WITH DIFFERENTIAL/PLATELET
BASOS PCT: 1 % (ref 0–1)
Basophils Absolute: 0 10*3/uL (ref 0.0–0.1)
EOS ABS: 0.1 10*3/uL (ref 0.0–0.7)
EOS PCT: 2 % (ref 0–5)
HCT: 42.4 % (ref 36.0–46.0)
Hemoglobin: 14.7 g/dL (ref 12.0–15.0)
LYMPHS ABS: 1.9 10*3/uL (ref 0.7–4.0)
Lymphocytes Relative: 32 % (ref 12–46)
MCH: 30 pg (ref 26.0–34.0)
MCHC: 34.7 g/dL (ref 30.0–36.0)
MCV: 86.5 fL (ref 78.0–100.0)
Monocytes Absolute: 0.6 10*3/uL (ref 0.1–1.0)
Monocytes Relative: 10 % (ref 3–12)
NEUTROS PCT: 56 % (ref 43–77)
Neutro Abs: 3.3 10*3/uL (ref 1.7–7.7)
PLATELETS: 192 10*3/uL (ref 150–400)
RBC: 4.9 MIL/uL (ref 3.87–5.11)
RDW: 13.3 % (ref 11.5–15.5)
WBC: 5.9 10*3/uL (ref 4.0–10.5)

## 2013-12-03 LAB — URINALYSIS, ROUTINE W REFLEX MICROSCOPIC
BILIRUBIN URINE: NEGATIVE
Glucose, UA: NEGATIVE mg/dL
Ketones, ur: NEGATIVE mg/dL
NITRITE: NEGATIVE
PROTEIN: NEGATIVE mg/dL
Specific Gravity, Urine: 1.011 (ref 1.005–1.030)
UROBILINOGEN UA: 0.2 mg/dL (ref 0.0–1.0)
pH: 8 (ref 5.0–8.0)

## 2013-12-03 LAB — PROTEIN AND GLUCOSE, CSF
GLUCOSE CSF: 78 mg/dL — AB (ref 43–76)
Total  Protein, CSF: 8 mg/dL — ABNORMAL LOW (ref 15–45)

## 2013-12-03 LAB — CSF CELL COUNT WITH DIFFERENTIAL
RBC Count, CSF: 42 /mm3 — ABNORMAL HIGH
Tube #: 1
WBC CSF: 1 /mm3 (ref 0–5)

## 2013-12-03 LAB — BASIC METABOLIC PANEL
BUN: 11 mg/dL (ref 6–23)
CALCIUM: 8.4 mg/dL (ref 8.4–10.5)
CO2: 22 mEq/L (ref 19–32)
Chloride: 106 mEq/L (ref 96–112)
Creatinine, Ser: 0.61 mg/dL (ref 0.50–1.10)
GLUCOSE: 105 mg/dL — AB (ref 70–99)
POTASSIUM: 3.8 meq/L (ref 3.7–5.3)
SODIUM: 142 meq/L (ref 137–147)

## 2013-12-03 LAB — URINE MICROSCOPIC-ADD ON

## 2013-12-03 LAB — GRAM STAIN

## 2013-12-03 LAB — MRSA PCR SCREENING: MRSA BY PCR: NEGATIVE

## 2013-12-03 LAB — POC URINE PREG, ED: PREG TEST UR: NEGATIVE

## 2013-12-03 SURGERY — REVISION, SHUNT, VENTRICULOPERITONEAL
Anesthesia: General | Site: Head

## 2013-12-03 MED ORDER — LIDOCAINE HCL (CARDIAC) 20 MG/ML IV SOLN
INTRAVENOUS | Status: DC | PRN
  Administered 2013-12-03: 100 mg via INTRAVENOUS

## 2013-12-03 MED ORDER — HYDROCODONE-ACETAMINOPHEN 5-325 MG PO TABS: 1.0000 | ORAL_TABLET | ORAL | Status: DC | PRN

## 2013-12-03 MED ORDER — MIDAZOLAM HCL 2 MG/2ML IJ SOLN
INTRAMUSCULAR | Status: AC
  Filled 2013-12-03: qty 4

## 2013-12-03 MED ORDER — MIDAZOLAM HCL 2 MG/2ML IJ SOLN
INTRAMUSCULAR | Status: AC
  Filled 2013-12-03: qty 2

## 2013-12-03 MED ORDER — CEFAZOLIN SODIUM-DEXTROSE 2-3 GM-% IV SOLR
2.0000 g | Freq: Three times a day (TID) | INTRAVENOUS | Status: AC
  Administered 2013-12-03 – 2013-12-04 (×2): 2 g via INTRAVENOUS
  Filled 2013-12-03 (×2): qty 50

## 2013-12-03 MED ORDER — HYDROMORPHONE HCL PF 1 MG/ML IJ SOLN
INTRAMUSCULAR | Status: AC
  Filled 2013-12-03: qty 1

## 2013-12-03 MED ORDER — HEMOSTATIC AGENTS (NO CHARGE) OPTIME
TOPICAL | Status: DC | PRN
  Administered 2013-12-03: 1 via TOPICAL

## 2013-12-03 MED ORDER — DIPHENHYDRAMINE HCL 50 MG/ML IJ SOLN
25.0000 mg | Freq: Once | INTRAMUSCULAR | Status: AC
  Administered 2013-12-03: 25 mg via INTRAVENOUS
  Filled 2013-12-03: qty 1

## 2013-12-03 MED ORDER — OXYCODONE HCL 5 MG PO TABS: 5.0000 mg | ORAL_TABLET | Freq: Once | ORAL | Status: DC | PRN

## 2013-12-03 MED ORDER — MORPHINE SULFATE 2 MG/ML IJ SOLN: 1.0000 mg | INTRAMUSCULAR | Status: DC | PRN

## 2013-12-03 MED ORDER — DOCUSATE SODIUM 100 MG PO CAPS
100.0000 mg | ORAL_CAPSULE | Freq: Two times a day (BID) | ORAL | Status: DC
  Administered 2013-12-03: 100 mg via ORAL
  Filled 2013-12-03: qty 1

## 2013-12-03 MED ORDER — ACETAMINOPHEN 325 MG PO TABS: 650.0000 mg | ORAL_TABLET | ORAL | Status: DC | PRN

## 2013-12-03 MED ORDER — ONDANSETRON HCL 4 MG/2ML IJ SOLN: 4.0000 mg | INTRAMUSCULAR | Status: DC | PRN

## 2013-12-03 MED ORDER — PROMETHAZINE HCL 25 MG PO TABS: 12.5000 mg | ORAL_TABLET | ORAL | Status: DC | PRN

## 2013-12-03 MED ORDER — ONDANSETRON HCL 4 MG/2ML IJ SOLN
INTRAMUSCULAR | Status: DC | PRN
  Administered 2013-12-03: 4 mg via INTRAVENOUS

## 2013-12-03 MED ORDER — ACETAMINOPHEN 650 MG RE SUPP: 650.0000 mg | RECTAL | Status: DC | PRN

## 2013-12-03 MED ORDER — 0.9 % SODIUM CHLORIDE (POUR BTL) OPTIME
TOPICAL | Status: DC | PRN
  Administered 2013-12-03: 1000 mL

## 2013-12-03 MED ORDER — ARTIFICIAL TEARS OP OINT
TOPICAL_OINTMENT | OPHTHALMIC | Status: DC | PRN
  Administered 2013-12-03: 1 via OPHTHALMIC

## 2013-12-03 MED ORDER — GLYCOPYRROLATE 0.2 MG/ML IJ SOLN
INTRAMUSCULAR | Status: DC | PRN
  Administered 2013-12-03: 0.4 mg via INTRAVENOUS

## 2013-12-03 MED ORDER — LABETALOL HCL 5 MG/ML IV SOLN: 10.0000 mg | INTRAVENOUS | Status: DC | PRN

## 2013-12-03 MED ORDER — NEOSTIGMINE METHYLSULFATE 1 MG/ML IJ SOLN
INTRAMUSCULAR | Status: DC | PRN
  Administered 2013-12-03: 3 mg via INTRAVENOUS

## 2013-12-03 MED ORDER — HYDROMORPHONE HCL PF 1 MG/ML IJ SOLN
0.2500 mg | INTRAMUSCULAR | Status: DC | PRN
  Administered 2013-12-03: 0.5 mg via INTRAVENOUS

## 2013-12-03 MED ORDER — FENTANYL CITRATE 0.05 MG/ML IJ SOLN
INTRAMUSCULAR | Status: AC
  Filled 2013-12-03: qty 5

## 2013-12-03 MED ORDER — ROCURONIUM BROMIDE 100 MG/10ML IV SOLN
INTRAVENOUS | Status: DC | PRN
  Administered 2013-12-03: 40 mg via INTRAVENOUS

## 2013-12-03 MED ORDER — CEFAZOLIN SODIUM-DEXTROSE 2-3 GM-% IV SOLR
INTRAVENOUS | Status: DC | PRN
  Administered 2013-12-03: 2 g via INTRAVENOUS

## 2013-12-03 MED ORDER — ONDANSETRON HCL 4 MG PO TABS: 4.0000 mg | ORAL_TABLET | ORAL | Status: DC | PRN

## 2013-12-03 MED ORDER — METOCLOPRAMIDE HCL 5 MG/ML IJ SOLN: 10.0000 mg | Freq: Once | INTRAMUSCULAR | Status: DC | PRN

## 2013-12-03 MED ORDER — METOCLOPRAMIDE HCL 5 MG/ML IJ SOLN
10.0000 mg | Freq: Once | INTRAMUSCULAR | Status: AC
  Administered 2013-12-03: 10 mg via INTRAVENOUS
  Filled 2013-12-03: qty 2

## 2013-12-03 MED ORDER — LACTATED RINGERS IV SOLN
INTRAVENOUS | Status: DC
  Administered 2013-12-04: 03:00:00 via INTRAVENOUS

## 2013-12-03 MED ORDER — DEXAMETHASONE SODIUM PHOSPHATE 10 MG/ML IJ SOLN
10.0000 mg | Freq: Once | INTRAMUSCULAR | Status: AC
  Administered 2013-12-03: 10 mg via INTRAVENOUS
  Filled 2013-12-03: qty 1

## 2013-12-03 MED ORDER — SODIUM CHLORIDE 0.9 % IR SOLN
Status: DC | PRN
  Administered 2013-12-03: 17:00:00

## 2013-12-03 MED ORDER — THROMBIN 5000 UNITS EX SOLR
CUTANEOUS | Status: DC | PRN
  Administered 2013-12-03 (×2): 5000 [IU] via TOPICAL

## 2013-12-03 MED ORDER — PROPOFOL 10 MG/ML IV BOLUS
INTRAVENOUS | Status: DC | PRN
  Administered 2013-12-03: 50 mg via INTRAVENOUS
  Administered 2013-12-03: 150 mg via INTRAVENOUS

## 2013-12-03 MED ORDER — METOCLOPRAMIDE HCL 5 MG/ML IJ SOLN
INTRAMUSCULAR | Status: DC | PRN
  Administered 2013-12-03: 10 mg via INTRAVENOUS

## 2013-12-03 MED ORDER — SODIUM CHLORIDE 0.9 % IV SOLN
INTRAVENOUS | Status: DC | PRN
  Administered 2013-12-03: 15:00:00 via INTRAVENOUS

## 2013-12-03 MED ORDER — PANTOPRAZOLE SODIUM 40 MG IV SOLR
40.0000 mg | Freq: Every day | INTRAVENOUS | Status: DC
  Administered 2013-12-03: 40 mg via INTRAVENOUS
  Filled 2013-12-03 (×3): qty 40

## 2013-12-03 MED ORDER — OXYCODONE HCL 5 MG/5ML PO SOLN: 5.0000 mg | Freq: Once | ORAL | Status: DC | PRN

## 2013-12-03 MED ORDER — MIDAZOLAM HCL 5 MG/5ML IJ SOLN
INTRAMUSCULAR | Status: DC | PRN
  Administered 2013-12-03: 1 mg via INTRAVENOUS

## 2013-12-03 MED ORDER — FENTANYL CITRATE 0.05 MG/ML IJ SOLN
INTRAMUSCULAR | Status: DC | PRN
  Administered 2013-12-03 (×3): 50 ug via INTRAVENOUS

## 2013-12-03 SURGICAL SUPPLY — 82 items
APL SKNCLS STERI-STRIP NONHPOA (GAUZE/BANDAGES/DRESSINGS) ×1
BAG DECANTER FOR FLEXI CONT (MISCELLANEOUS) ×3 IMPLANT
BENZOIN TINCTURE PRP APPL 2/3 (GAUZE/BANDAGES/DRESSINGS) ×3 IMPLANT
BLADE CLIPPER SURG NEURO (BLADE) ×2 IMPLANT
BLADE SURG 10 STRL SS (BLADE) ×6 IMPLANT
BLADE SURG 15 STRL LF DISP TIS (BLADE) ×1 IMPLANT
BLADE SURG 15 STRL SS (BLADE) ×3
BLADE SURG ROTATE 9660 (MISCELLANEOUS) ×2 IMPLANT
BOOT SUTURE AID YELLOW STND (SUTURE) ×3 IMPLANT
BRUSH SCRUB EZ 1% IODOPHOR (MISCELLANEOUS) ×1 IMPLANT
BRUSH SCRUB EZ PLAIN DRY (MISCELLANEOUS) ×4 IMPLANT
BUR ACORN 6.0 PRECISION (BURR) IMPLANT
BUR ACORN 6.0MM PRECISION (BURR)
CANISTER SUCT 3000ML (MISCELLANEOUS) ×3 IMPLANT
CATH SNAP PUDENZ 5CM (CATHETERS) ×2 IMPLANT
CATH VENTRICULAR 9CM (CATHETERS) ×2 IMPLANT
CLIP RANEY DISP (INSTRUMENTS) ×2 IMPLANT
CLOSURE WOUND 1/2 X4 (GAUZE/BANDAGES/DRESSINGS) ×1
CONT SPEC 4OZ CLIKSEAL STRL BL (MISCELLANEOUS) ×2 IMPLANT
CORDS BIPOLAR (ELECTRODE) ×3 IMPLANT
COVER MAYO STAND STRL (DRAPES) ×3 IMPLANT
DRAPE INCISE IOBAN 85X60 (DRAPES) ×3 IMPLANT
DRAPE ORTHO SPLIT 77X108 STRL (DRAPES) ×3
DRAPE POUCH INSTRU U-SHP 10X18 (DRAPES) ×3 IMPLANT
DRAPE PROXIMA HALF (DRAPES) ×3 IMPLANT
DRAPE SURG 17X23 STRL (DRAPES) ×18 IMPLANT
DRAPE SURG ORHT 6 SPLT 77X108 (DRAPES) ×1 IMPLANT
DRESSING TELFA 8X3 (GAUZE/BANDAGES/DRESSINGS) IMPLANT
ELECT CAUTERY BLADE 6.4 (BLADE) ×3 IMPLANT
ELECT REM PT RETURN 9FT ADLT (ELECTROSURGICAL) ×3
ELECTRODE REM PT RTRN 9FT ADLT (ELECTROSURGICAL) ×1 IMPLANT
GAUZE SPONGE 4X4 16PLY XRAY LF (GAUZE/BANDAGES/DRESSINGS) ×4 IMPLANT
GLOVE BIO SURGEON STRL SZ 6.5 (GLOVE) ×2 IMPLANT
GLOVE BIO SURGEON STRL SZ8.5 (GLOVE) ×3 IMPLANT
GLOVE BIO SURGEONS STRL SZ 6.5 (GLOVE) ×2
GLOVE BIOGEL M 8.0 STRL (GLOVE) ×2 IMPLANT
GLOVE BIOGEL PI IND STRL 6.5 (GLOVE) IMPLANT
GLOVE BIOGEL PI INDICATOR 6.5 (GLOVE) ×2
GLOVE EXAM NITRILE LRG STRL (GLOVE) ×2 IMPLANT
GLOVE EXAM NITRILE MD LF STRL (GLOVE) ×2 IMPLANT
GLOVE EXAM NITRILE XL STR (GLOVE) IMPLANT
GLOVE EXAM NITRILE XS STR PU (GLOVE) IMPLANT
GLOVE SS BIOGEL STRL SZ 8 (GLOVE) ×1 IMPLANT
GLOVE SUPERSENSE BIOGEL SZ 8 (GLOVE) ×2
GOWN BRE IMP SLV AUR LG STRL (GOWN DISPOSABLE) IMPLANT
GOWN BRE IMP SLV AUR XL STRL (GOWN DISPOSABLE) IMPLANT
KIT BASIN OR (CUSTOM PROCEDURE TRAY) ×3 IMPLANT
KIT ROOM TURNOVER OR (KITS) ×3 IMPLANT
NDL HYPO 25X1 1.5 SAFETY (NEEDLE) IMPLANT
NEEDLE HYPO 22GX1.5 SAFETY (NEEDLE) ×3 IMPLANT
NEEDLE HYPO 25X1 1.5 SAFETY (NEEDLE) ×3 IMPLANT
NS IRRIG 1000ML POUR BTL (IV SOLUTION) ×3 IMPLANT
PACK EENT II TURBAN DRAPE (CUSTOM PROCEDURE TRAY) ×3 IMPLANT
PAD ARMBOARD 7.5X6 YLW CONV (MISCELLANEOUS) ×9 IMPLANT
PATTIES SURGICAL .5 X.5 (GAUZE/BANDAGES/DRESSINGS) ×2 IMPLANT
PATTIES SURGICAL 1X1 (DISPOSABLE) ×2 IMPLANT
PENCIL BUTTON HOLSTER BLD 10FT (ELECTRODE) ×3 IMPLANT
SHEATH PERITONEAL INTRO 46 (MISCELLANEOUS) IMPLANT
SHEATH PERITONEAL INTRO 61 (MISCELLANEOUS) IMPLANT
SPONGE GAUZE 4X4 12PLY (GAUZE/BANDAGES/DRESSINGS) ×2 IMPLANT
SPONGE LAP 4X18 X RAY DECT (DISPOSABLE) ×3 IMPLANT
STAPLER SKIN PROX WIDE 3.9 (STAPLE) ×3 IMPLANT
STRIP CLOSURE SKIN 1/2X4 (GAUZE/BANDAGES/DRESSINGS) ×2 IMPLANT
SUT BONE WAX W31G (SUTURE) IMPLANT
SUT ETHILON 3 0 PS 1 (SUTURE) IMPLANT
SUT NURALON 4 0 TR CR/8 (SUTURE) IMPLANT
SUT SILK 0 TIES 10X30 (SUTURE) IMPLANT
SUT SILK 3 0 SH 30 (SUTURE) ×1 IMPLANT
SUT VIC AB 1 CT1 18XBRD ANBCTR (SUTURE) IMPLANT
SUT VIC AB 1 CT1 8-18 (SUTURE)
SUT VIC AB 2-0 CP2 18 (SUTURE) ×5 IMPLANT
SUT VIC AB 3-0 SH 8-18 (SUTURE) ×1 IMPLANT
SYR BULB 3OZ (MISCELLANEOUS) ×3 IMPLANT
SYR CONTROL 10ML LL (SYRINGE) ×3 IMPLANT
TAPE CLOTH SURG 4X10 WHT LF (GAUZE/BANDAGES/DRESSINGS) ×2 IMPLANT
TOWEL OR 17X24 6PK STRL BLUE (TOWEL DISPOSABLE) ×3 IMPLANT
TOWEL OR 17X26 10 PK STRL BLUE (TOWEL DISPOSABLE) ×3 IMPLANT
TRAY FOLEY CATH 14FRSI W/METER (CATHETERS) IMPLANT
TUBE CONNECTING 12'X1/4 (SUCTIONS) ×1
TUBE CONNECTING 12X1/4 (SUCTIONS) ×2 IMPLANT
UNDERPAD 30X30 INCONTINENT (UNDERPADS AND DIAPERS) ×1 IMPLANT
WATER STERILE IRR 1000ML POUR (IV SOLUTION) ×3 IMPLANT

## 2013-12-03 NOTE — Anesthesia Procedure Notes (Signed)
Procedure Name: Intubation Date/Time: 12/03/2013 4:48 PM Performed by: Tyrone NineSAUVE, [name] Pre-anesthesia Checklist: Patient identified, Timeout performed, Emergency Drugs available, Suction available and Patient being monitored Patient Re-evaluated:Patient Re-evaluated prior to inductionOxygen Delivery Method: Circle system utilized Preoxygenation: Pre-oxygenation with 100% oxygen Intubation Type: IV induction Ventilation: Mask ventilation without difficulty Laryngoscope Size: Mac and 3 Grade View: Grade I Tube type: Oral Tube size: 7.5 mm Number of attempts: 1 Airway Equipment and Method: Stylet Placement Confirmation: ETT inserted through vocal cords under direct vision,  positive ETCO2 and breath sounds checked- equal and bilateral Secured at: 22 cm Tube secured with: Tape Dental Injury: Teeth and Oropharynx as per pre-operative assessment

## 2013-12-03 NOTE — H&P (Signed)
Subjective: Patient is a 38 year old female immigrant in from [place] who is well known to the neurosurgical service. The patient had a ventriculoperitoneal shunt placed years ago by Dr. Venetia Maxon. The patient developed an enterococcal meningitis. She was treated with antibiotics. He has had multiple shunt malfunctions and shunt revisions. The last revision was done by me about 3 years ago.  The patient had been doing well until fairly recently. She's had increasing headaches with nausea and vomiting. She's has been seen in the ER over last few days. CAT scans have been obtained. The patient returned to the ER today with similar complaints. A CAT scan today demonstrated enlargement of her ventricles compared with the previous study of April 2014.  A neurosurgical consultation was requested. Presently the patient is accompanied by a friend. He tells the she feels a little better but still has a headache. He's had some nausea and vomiting.  Past Medical History  Diagnosis Date  . VP (ventriculoperitoneal) shunt status     Past Surgical History  Procedure Laterality Date  . Shunt      brain    No Known Allergies  History  Substance Use Topics  . Smoking status: Never Smoker   . Smokeless tobacco: Never Used  . Alcohol Use: No    Family History  Problem Relation Age of Onset  . Diabetes Father    Prior to Admission medications   Medication Sig Start Date End Date Taking? Authorizing Provider  HYDROcodone-acetaminophen (NORCO/VICODIN) 5-325 MG per tablet Take 1-2 tablets by mouth every 4 (four) hours as needed. 12/02/13  Yes Raeford Razor, MD     Review of Systems  Positive ROS: As above  All other systems have been reviewed and were otherwise negative with the exception of those mentioned in the HPI and as above.  Objective: Vital signs in last 24 hours: Temp:  [98.4 F (36.9 C)-98.5 F (36.9 C)] 98.4 F (36.9 C) (02/26 0930) Pulse Rate:  [63-81] 81 (02/26 1432) Resp:  [14-17] 16  (02/26 1432) BP: (106-137)/(59-85) 118/76 mmHg (02/26 1432) SpO2:  [98 %-100 %] 100 % (02/26 1432) Weight:  [63.504 kg (140 lb)] 63.504 kg (140 lb) (02/26 0930)  General Appearance: Alert, cooperative, no distress, Head: Normocephalic, without obvious abnormality, atraumatic. The patient's shunt incisions are well-healed. Eyes: PERRL, conjunctiva/corneas clear, EOM's intact,    Ears: Normal  Throat: Normal  Neck: Supple, symmetrical, trachea midline, no adenopathy; thyroid: No enlargement/tenderness/nodules; no carotid bruit or JVD Back: Symmetric, no curvature, ROM normal, no CVA tenderness Lungs: Clear to auscultation bilaterally, respirations unlabored Heart: Regular rate and rhythm, no murmur, rub or gallop Abdomen: Soft, non-tender,, no masses, no organomegaly. The patient shunt insertion site is well-healed. Extremities: Extremities normal, atraumatic, no cyanosis or edema Pulses: 2+ and symmetric all extremities Skin: Skin color, texture, turgor normal, no rashes or lesions  NEUROLOGIC:   Mental status: alert and oriented, no aphasia, good attention span, Fund of knowledge/ memory ok Motor Exam - grossly normal Sensory Exam - grossly normal Reflexes: Not tested Coordination - grossly normal Gait -not tested Balance - not tested Cranial Nerves: I: smell Not tested  II: visual acuity  OS: Normal  OD: Normal   II: visual fields Full to confrontation  II: pupils Equal, round, reactive to light  III,VII: ptosis None  III,IV,VI: extraocular muscles  Full ROM  V: mastication Normal  V: facial light touch sensation  Normal  V,VII: corneal reflex  Present  VII: facial muscle function - upper  Normal  VII: facial muscle function - lower Normal  VIII: hearing Not tested  IX: soft palate elevation  Normal  IX,X: gag reflex Present  XI: trapezius strength  5/5  XI: sternocleidomastoid strength 5/5  XI: neck flexion strength  5/5  XII: tongue strength  Normal    Data  Review Lab Results  Component Value Date   WBC 5.9 12/03/2013   HGB 14.7 12/03/2013   HCT 42.4 12/03/2013   MCV 86.5 12/03/2013   PLT 192 12/03/2013   Lab Results  Component Value Date   NA 142 12/03/2013   K 3.8 12/03/2013   CL 106 12/03/2013   CO2 22 12/03/2013   BUN 11 12/03/2013   CREATININE 0.61 12/03/2013   GLUCOSE 105* 12/03/2013   Lab Results  Component Value Date   INR 0.95 06/21/2011   I have reviewed the patient's head CT performed today at [facility]. I compared it with the prior study of April 2014. The patient has enlargement of her ventricles and temporal horns.  Assessment/Plan: Ventriculoperitoneal shunt malfunction, hydrocephalus, headaches: I discussed the situation with the patient, her friend and subsequently with her husband via the telephone. We have discussed the various treatment options including observation versus a ventriculoperitoneal shunt revision. I have recommended the latter. I described the surgery to them. We have discussed the risks of surgery including risks of anesthesia, hemorrhage, infection, seizures, shunt malplacement, shunt malfunction, etc. I've answered all the patient, her friends, and her husband's questions. The patient has decided to proceed with a ventriculoperitoneal shunt revision   Jeromie Gainor D 12/03/2013 2:37 PM

## 2013-12-03 NOTE — Anesthesia Postprocedure Evaluation (Signed)
Anesthesia Post Note  Patient: [name]  Procedure(s) Performed: Procedure(s) (LRB): Revision of Ventricular Peritoneal Shunt (N/A)  Anesthesia type: General  Patient location: PACU  Post pain: Pain level controlled and Adequate analgesia  Post assessment: Post-op Vital signs reviewed, Patient's Cardiovascular Status Stable, Respiratory Function Stable, Patent Airway and Pain level controlled  Last Vitals:  Filed Vitals:   12/03/13 1722  BP: 127/79  Pulse: 63  Temp:   Resp: 17    Post vital signs: Reviewed and stable  Level of consciousness: awake, alert  and oriented  Complications: No apparent anesthesia complications

## 2013-12-03 NOTE — Transfer of Care (Signed)
Immediate Anesthesia Transfer of Care Note  Patient: [name]  Procedure(s) Performed: Procedure(s): Revision of Ventricular Peritoneal Shunt (N/A)  Patient Location: PACU  Anesthesia Type:General  Level of Consciousness: awake, oriented and patient cooperative  Airway & Oxygen Therapy: Patient Spontanous Breathing and Patient connected to nasal cannula oxygen  Post-op Assessment: Report given to PACU RN and Post -op Vital signs reviewed and stable  Post vital signs: Reviewed and stable  Complications: No apparent anesthesia complications

## 2013-12-03 NOTE — ED Provider Notes (Signed)
CSN: [id number]     Arrival date & time 12/03/13  [id number] History   First MD Initiated Contact with Patient 12/03/13 [id number]     Chief Complaint  Patient presents with  . Headache     (Consider location/radiation/quality/duration/timing/severity/associated sxs/prior Treatment) Patient is a 38 y.o. female presenting with headaches.  Headache Pain location:  Frontal Quality:  Dull (throbbing) Pain severity now: severe. Onset quality:  Gradual Duration:  3 days Timing:  Intermittent Progression:  Worsening Chronicity:  Recurrent Context comment:  Hx of VP shunt.  Eval'd yesterday at which time headache was resolved after ED treatment.  HA came back this morning.   Worsened by:  Light and sound Associated symptoms: photophobia   Associated symptoms: no blurred vision, no dizziness, no fever, no focal weakness, no hearing loss, no loss of balance, no neck stiffness, no numbness, no sinus pressure, no URI, no visual change and no weakness     Past Medical History  Diagnosis Date  . VP (ventriculoperitoneal) shunt status    Past Surgical History  Procedure Laterality Date  . Shunt      brain   Family History  Problem Relation Age of Onset  . Diabetes Father    History  Substance Use Topics  . Smoking status: Never Smoker   . Smokeless tobacco: Never Used  . Alcohol Use: No   OB History   Grav Para Term Preterm Abortions TAB SAB Ect Mult Living                 Review of Systems  Constitutional: Negative for fever.  HENT: Negative for hearing loss and sinus pressure.   Eyes: Positive for photophobia. Negative for blurred vision.  Musculoskeletal: Negative for neck stiffness.  Neurological: Positive for headaches. Negative for dizziness, focal weakness, numbness and loss of balance.  All other systems reviewed and are negative.      Allergies  Review of patient's allergies indicates no known allergies.  Home Medications   Current Outpatient Rx  Name  Route  Sig   Dispense  Refill  . HYDROcodone-acetaminophen (NORCO/VICODIN) 5-325 MG per tablet   Oral   Take 1-2 tablets by mouth every 4 (four) hours as needed.   25 tablet   0    BP 123/73  Pulse 76  Temp(Src) 98.4 F (36.9 C) (Oral)  Resp 16  Wt 140 lb (63.504 kg)  SpO2 100%  LMP 11/25/2013 Physical Exam  Nursing note and vitals reviewed. Constitutional: She is oriented to person, place, and time. She appears well-developed and well-nourished. No distress.  Appears in pain  HENT:  Head: Normocephalic and atraumatic.  Mouth/Throat: Oropharynx is clear and moist.  Eyes: Conjunctivae are normal. Pupils are equal, round, and reactive to light. No scleral icterus.  Neck: Neck supple.  Cardiovascular: Normal rate, regular rhythm, normal heart sounds and intact distal pulses.   No murmur heard. Pulmonary/Chest: Effort normal and breath sounds normal. No stridor. No respiratory distress. She has no rales.  Abdominal: Soft. Bowel sounds are normal. She exhibits no distension. There is no tenderness.  Musculoskeletal: Normal range of motion.  Neurological: She is alert and oriented to person, place, and time. No cranial nerve deficit or sensory deficit. Coordination (has difficulty with finger to nose on left which appears to be secondary to inattention) abnormal. Gait normal. GCS eye subscore is 4. GCS verbal subscore is 5. GCS motor subscore is 6.  Skin: Skin is warm and dry. No rash noted.  Psychiatric: She has  a normal mood and affect. Her behavior is normal.    ED Course  Procedures (including critical care time) Labs Review Labs Reviewed - No data to display Imaging Review   Ct Head Wo Contrast  12/03/2013   CLINICAL DATA:  Headache.  Evaluate VP shunt status.  EXAM: CT HEAD WITHOUT CONTRAST  TECHNIQUE: Contiguous axial images were obtained from the base of the skull through the vertex without intravenous contrast.  COMPARISON:  One day prior  FINDINGS: Sinuses/Soft tissues: Hypoplastic  frontal sinuses. Hyperostosis frontalis interna. Otherwise normal paranasal sinuses and mastoid air cells.  Intracranial: The right-sided VP shunt catheter terminates in the anterior horn of the left lateral ventricle, similar to on the prior exam. The extent of hydrocephalus (lateral and third ventricles only) is moderate and similar to on yesterday's study. Similar to minimally progressive since 02/03/2013. Definitely progressive since 05/20/2007.  Minimal periventricular white matter hypoattenuation is chronic and could relate to small vessel ischemic change or transependymal CSF resorption.  No hemorrhage, mass lesion, intra-axial, or extra-axial fluid collection.  IMPRESSION: 1. Right-sided VP shunt catheter in place. Similar moderate hydrocephalus since 1 day prior. Similar to minimal progression since 02/03/2013. 2. No acute superimposed explanation for headache.   Electronically Signed   By: Jeronimo Greaves M.D.   On: 12/03/2013 11:36   Above radiology studies independently viewed by me.      EKG Interpretation   None       MDM   Final diagnoses:  Headache  Malfunction of ventriculoperitoneal shunt    38 yo female with hx of VP shunt presenting with a headache.  Seen last night, at which time she reports HA resolved after meds.  HA came back tonight.  She appears to be hurting, but is nontoxic. Her neuro exam is only notable for reduced finger to nose on left, which seems to be secondary to pt not looking at finger due to photophobia.  Repeat head CT and consult NSU.   Repeat head CT similar to yesterday, with mild increased ventricular size.  NSU will admit for VP shunt revision.    Candyce Churn III, MD 12/04/13 [phone]

## 2013-12-03 NOTE — Op Note (Signed)
Brief history: The patient is a 38 year old immigrant [place] who has a history of hydrocephalus. She had a ventriculoperitoneal shunt placed but not in other physician years ago. 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 The patient has had multiple shunt revisions. The last revision was performed by me about 3 years ago. The patient has had headaches over last few days with nausea vomiting. She's been to the ER a few times and had CAT scans which have demonstrated ventriculomegaly compared with prior studies. I discussed situation with the patient and her husband. I recommended a shunt revision. I explained the surgery and the risks, benefits, and alternatives. I have answered all their questions. The patient has decided to proceed with surgery.  Preop diagnosis: Ventriculoperitoneal shunt malfunction, hydrocephalus, headaches  Postop diagnosis: The same with a proximal ventriculoperitoneal shunt malfunction  Procedure: Retroperitoneal shunt revision(replacement of the ventricular catheter with a 9 cm snap on Medtronic catheter)  Surgeon: Dr. Delma OfficerJeff Analycia Khokhar  Assistant: Dr. Hilda LiasErnesto Botero  Anesthesia: Gen. endotracheal  Estimated blood loss: Minimal  Specimens: Wound culture and cerebral spinal fluid cultures  Complications: None  Description of procedure: The patient was brought to the operating room by the anesthesia team. General endotracheal anesthesia was induced. The patient remained in the supine position. I placed a roll under her right shoulder and thorax. The patient's right retroauricular scalp was then shaved with clippers. This region as well as the neck thorax and abdomen were then prepared with Betadine scrub and Betadine solution. Sterile drapes were applied. I then used a 15 blade scalpel to incise the patient's prior retroauricular incision/scar. I used electro-cautery to expose the underlying Rickham reservoir and the proximal shunt valve. I used the cerebellar  retractor for exposure. I then partially retracted the Rickham reservoir exposing the snap on catheter. I disconnected the catheter from the Rickham reservoir. There was no flow of cerebral spinal fluid indicative of of a proximal shunt malfunction/ventricular catheter malfunction. I did note some milky fluid in the ventricular catheter. I therefore obtained some cultures of this fluid. I then used a syringe to gently flush the valve and the peritoneal catheter. There was easy distal flow of the saline solution indicating the valve and distal catheter were patent. I then removed the ventricular catheter. When I had it about halfway out there was brisk flow of spinal fluid through the old ventricular catheter. I removed the old ventricular catheter and then through the same tract placed a new 9 cm Medtronic snap on ventricular catheter. Upon seating the catheter there was free flow of cerebral spinal fluid under high pressure. I connected this tap an assembly to the valve. I then secured the Rickham reservoir with a figure-of-eight Vicryl suture in the periosteum. I then used a 26-gauge needle to aspirate through the Rickham reservoir and obtained approximately 4 cc of cervical spinal fluid. We sent this off for studies including cell count differential,  protein, glucose and culture sensitivity. We then irrigated the wound out with bacitracin solution. I then removed the retractor. I then reapproximated the patient's galea with interrupted 2-0 Vicryl suture. I reapproximated the skin with stainless steel staples. The wound is then coated with bacitracin ointment. A sterile dressing was applied. The drapes were removed. The patient was subsequent exudative by the anesthesia team and transported to the post anesthesia care unit in stable condition. By report all sponge, instrument, and needle counts were correct at the end this case.

## 2013-12-03 NOTE — Anesthesia Preprocedure Evaluation (Addendum)
Anesthesia Evaluation  Patient identified by MRN, date of birth, ID band Patient awake    Reviewed: Allergy & Precautions, H&P , NPO status , Patient's Chart, lab work & pertinent test results, reviewed documented beta blocker date and time   Airway Mallampati: II TM Distance: >3 FB Neck ROM: full    Dental  (+) Dental Advisory Given, Partial Upper, Partial Lower   Pulmonary neg pulmonary ROS,  breath sounds clear to auscultation        Cardiovascular Exercise Tolerance: Good negative cardio ROS  Rhythm:regular     Neuro/Psych  Headaches, negative psych ROS   GI/Hepatic negative GI ROS, Neg liver ROS,   Endo/Other  negative endocrine ROS  Renal/GU negative Renal ROS  negative genitourinary   Musculoskeletal   Abdominal   Peds  Hematology negative hematology ROS (+)   Anesthesia Other Findings See surgeon's H&P   Reproductive/Obstetrics negative OB ROS                        Anesthesia Physical Anesthesia Plan  ASA: II  Anesthesia Plan: General   Post-op Pain Management:    Induction: Intravenous  Airway Management Planned: Oral ETT  Additional Equipment:   Intra-op Plan:   Post-operative Plan: Extubation in OR  Informed Consent: I have reviewed the patients History and Physical, chart, labs and discussed the procedure including the risks, benefits and alternatives for the proposed anesthesia with the patient or authorized representative who has indicated his/her understanding and acceptance.   Dental Advisory Given  Plan Discussed with: CRNA and Surgeon  Anesthesia Plan Comments:         Anesthesia Quick Evaluation

## 2013-12-03 NOTE — Preoperative (Signed)
Beta Blockers   Reason not to administer Beta Blockers:Not Applicable 

## 2013-12-03 NOTE — ED Notes (Signed)
Pt was here last night for a headache, she has a shunt. She went home and took her pain medication but it didn't help and the headache remains. A&ox4

## 2013-12-03 NOTE — Progress Notes (Signed)
Patient ID: [name], female   DOB: [birth year], 38 y.o.   MRN: [id number] Subjective:  The patient is somnolent but easily arousable. She looks well. She is in no apparent distress.  Objective: Vital signs in last 24 hours: Temp:  [98.4 F (36.9 C)-98.5 F (36.9 C)] 98.4 F (36.9 C) (02/26 0930) Pulse Rate:  [63-89] 85 (02/26 1445) Resp:  [14-17] 16 (02/26 1432) BP: (106-137)/(59-90) 114/90 mmHg (02/26 1445) SpO2:  [97 %-100 %] 100 % (02/26 1445) Weight:  [63.504 kg (140 lb)] 63.504 kg (140 lb) (02/26 0930)  Intake/Output from previous day:   Intake/Output this shift: Total I/O In: 900 [I.V.:900] Out: 20 [Blood:20]  Physical exam the patient is somnolent but easily arousable. She is moving all 4 extremities well. Her dressing is clean and dry.  Lab Results:  Recent Labs  12/03/13 1030  WBC 5.9  HGB 14.7  HCT 42.4  PLT 192   BMET  Recent Labs  12/03/13 1030  NA 142  K 3.8  CL 106  CO2 22  GLUCOSE 105*  BUN 11  CREATININE 0.61  CALCIUM 8.4    Studies/Results: Dg Chest 1 View  12/02/2013   CLINICAL DATA:  Headaches; history of ventriculoperitoneal shunt.  EXAM: CHEST - 1 VIEW  COMPARISON:  Chest radiograph performed 02/03/2013  FINDINGS: The lungs are well-aerated and clear. There is no evidence of focal opacification, pleural effusion or pneumothorax.  The patient's ventriculoperitoneal shunt is again noted coiling over the right hemithorax and ending along the right lateral hemithorax. Visualized portions of the shunt appear grossly intact.  The cardiomediastinal silhouette is within normal limits. No acute osseous abnormalities are seen.  IMPRESSION: 1. No acute cardiopulmonary process seen. 2. Visualized portions of the ventriculoperitoneal shunt are grossly intact; the shunt is again noted ending overlying the right hemithorax.   Electronically Signed   By: Roanna Raider M.D.   On: 12/02/2013 21:30   Dg Abd 1 View  12/02/2013   CLINICAL DATA:   Ventriculoperitoneal shunt assessment; headache.  EXAM: ABDOMEN - 1 VIEW  COMPARISON:  Abdominal radiograph performed 06/21/2011  FINDINGS: The visualized bowel gas pattern is unremarkable. Scattered air and stool filled loops of colon are seen; no abnormal dilatation of small bowel loops is seen to suggest small bowel obstruction. No free intra-abdominal air is identified, though evaluation for free air is limited on a single supine view.  As noted on concurrent chest radiograph and prior studies, the patient's ventriculoperitoneal shunt coils over the right hemithorax and ends at the right lateral hemithorax. It is not imaged on this study, as it does not extend to the abdomen or pelvis.  The visualized osseous structures are within normal limits; the sacroiliac joints are unremarkable in appearance. An intrauterine device is noted overlying the mid pelvis.  IMPRESSION: 1. Unremarkable bowel gas pattern; no free intra-abdominal air seen. 2. As noted on concurrent chest radiograph and prior studies, the patient's ventriculoperitoneal shunt coils over the right hemithorax and ends at the right lateral hemithorax. It is not imaged on this study, as it does not extend to the abdomen or pelvis.   Electronically Signed   By: Roanna Raider M.D.   On: 12/02/2013 21:32   Ct Head Wo Contrast  12/03/2013   CLINICAL DATA:  Headache.  Evaluate VP shunt status.  EXAM: CT HEAD WITHOUT CONTRAST  TECHNIQUE: Contiguous axial images were obtained from the base of the skull through the vertex without intravenous contrast.  COMPARISON:  One day prior  FINDINGS: Sinuses/Soft tissues: Hypoplastic frontal sinuses. Hyperostosis frontalis interna. Otherwise normal paranasal sinuses and mastoid air cells.  Intracranial: The right-sided VP shunt catheter terminates in the anterior horn of the left lateral ventricle, similar to on the prior exam. The extent of hydrocephalus (lateral and third ventricles only) is moderate and similar to on  yesterday's study. Similar to minimally progressive since 02/03/2013. Definitely progressive since 05/20/2007.  Minimal periventricular white matter hypoattenuation is chronic and could relate to small vessel ischemic change or transependymal CSF resorption.  No hemorrhage, mass lesion, intra-axial, or extra-axial fluid collection.  IMPRESSION: 1. Right-sided VP shunt catheter in place. Similar moderate hydrocephalus since 1 day prior. Similar to minimal progression since 02/03/2013. 2. No acute superimposed explanation for headache.   Electronically Signed   By: Jeronimo GreavesKyle  Talbot M.D.   On: 12/03/2013 11:36   Ct Head Wo Contrast  12/02/2013   CLINICAL DATA:  Headaches, nausea, vomiting  EXAM: CT HEAD WITHOUT CONTRAST  TECHNIQUE: Contiguous axial images were obtained from the base of the skull through the vertex without intravenous contrast.  COMPARISON:  Head CT February 03, 2013  FINDINGS: There is hydrocephalus with enlargement of bilateral lateral ventricles which appear worse compared to the previous head CT of February 29, 2014. Right parietal shunt is identified with distal tip in the anterior horn of the left lateral ventricle unchanged. There is no acute hemorrhage. There is no extra-axial collection. Are 10  IMPRESSION: Right parietal shunt is identified without interval change. There is hydrocephalus with enlargement of bilateral lateral ventricles, worse compared to prior head CT of February 03, 2013.   Electronically Signed   By: Sherian ReinWei-Chen  Lin M.D.   On: 12/02/2013 20:25    Assessment/Plan: The patient is doing well. I will plan to observe her in the ICU overnight. We will repeat her CAT scan tomorrow. If it looks good we can send her home tomorrow.  LOS: 0 days     Jakeline Dave D 12/03/2013, 5:09 PM

## 2013-12-03 NOTE — Consult Note (Signed)
  Neurosurgery RN visit to check status. Dr. Venetia MaxonStern will visit upon completion of current OR case.  Ms. Kathi SimpersRosanna Dellarosa Turezzo, a patient of Dr. Fredrich BirksStern's many years ago with v.p. shunt placed for hydrocephalus, visited ED last night reporting headache. Head CT showed similar to slightly enlarged ventricles in comparison to April 2014 study.  She was treated for headache and d/c'ed to home.  She returned this AM d/t recurrence of headache.    Pt sleeping soundly upon RN entrance. Discussion with pt's husband reveals periodic headaches in years since shunt placement, some of which required ED visits for medication.  He notes h/a yesterday & today differ only in that last night's h/a returned. Pt awakens to voice. PEARL. No drift.Tongue protrudes midline. MAEW. Speech fluent (interpreted by husband). Pt conveys through husband's interpretation that her headache persists mildly, much improved since this AM.  New CT today reveals no change from last night's study, with similar to mildly enlarged ventricles compared to 4/14 study.  Pt & husband verbalize understanding that Dr. Venetia MaxonStern will visit to further evaluate pt upon completing his current surgery.  Georgiann CockerBrian Joelynn Dust, RN, BSN

## 2013-12-04 ENCOUNTER — Encounter (HOSPITAL_COMMUNITY): Payer: Self-pay | Admitting: *Deleted

## 2013-12-04 ENCOUNTER — Observation Stay (HOSPITAL_COMMUNITY): Payer: Medicaid Other

## 2013-12-04 MED ORDER — DSS 100 MG PO CAPS: 100.0000 mg | ORAL_CAPSULE | Freq: Two times a day (BID) | ORAL | Status: DC

## 2013-12-04 MED ORDER — HYDROCODONE-ACETAMINOPHEN 5-325 MG PO TABS: 1.0000 | ORAL_TABLET | ORAL | Status: DC | PRN

## 2013-12-04 NOTE — Progress Notes (Signed)
Pt d/c home today, gave d/c inst, Dr ph #, when to call, etc.

## 2013-12-04 NOTE — Discharge Summary (Signed)
Physician Discharge Summary  Patient ID: [name] MRN: [id number] DOB/AGE: [birth year] 38 y.o.  Admit date: 12/03/2013 Discharge date: 12/04/2013  Admission Diagnoses: Ventriculoperitoneal shunt malfunction, hydrocephalus  Discharge Diagnoses: The same Active Problems:   Malfunction of ventriculoperitoneal shunt   Discharged Condition: good  Hospital Course: I performed a revision of the patient's ventricular peritoneal shunt on 12/03/2013 for a proximal shunt malfunction (I replaced the patient's ventricular catheter with a 9 cm Medtronic snap-on the ventricular catheter). The surgery went well. The patient's postoperative course was unremarkable. A postop head CT demonstrated good position of the ventricular catheter with decreased size of the patient's ventricles. On postop day #1 the patient's headache was gone and she requested discharge to home. The patient was given oral and written discharge instructions. All questions were answered.  Consults: None Significant Diagnostic Studies: Head CT Treatments: Proximal revision of ventriculoperitoneal shunt Discharge Exam: Blood pressure 122/71, pulse 68, temperature 98.3 F (36.8 C), temperature source Oral, resp. rate 14, height 5\' 7"  (1.702 m), weight 72.2 kg (159 lb 2.8 oz), last menstrual period 11/25/2013, SpO2 100.00%. Patient is alert and pleasant. She looks well. Her dressing is clean and dry.  Disposition: Home  Discharge Orders   Future Orders Complete By Expires   Call MD for:  difficulty breathing, headache or visual disturbances  As directed    Call MD for:  extreme fatigue  As directed    Call MD for:  hives  As directed    Call MD for:  persistant dizziness or light-headedness  As directed    Call MD for:  persistant nausea and vomiting  As directed    Call MD for:  redness, tenderness, or signs of infection (pain, swelling, redness, odor or green/yellow discharge around incision site)  As directed    Call MD  for:  severe uncontrolled pain  As directed    Call MD for:  temperature >100.4  As directed    Diet - low sodium heart healthy  As directed    Discharge instructions  As directed    Comments:     Call [phone] for a followup appointment. Take a stool softener while you are using pain medications.   Driving Restrictions  As directed    Comments:     Do not drive for 2 weeks.   Increase activity slowly  As directed    Lifting restrictions  As directed    Comments:     Do not lift more than 5 pounds. No excessive bending or twisting.   May shower / Bathe  As directed    Comments:     He may shower after the pain she is removed 3 days after surgery. Leave the incision alone.   Remove dressing in 48 hours  As directed    Comments:     Your stitches are under the scan and will dissolve by themselves. The Steri-Strips will fall off after you take a few showers. Do not rub back or pick at the wound, Leave the wound alone.       Medication List         DSS 100 MG Caps  Take 100 mg by mouth 2 (two) times daily.     HYDROcodone-acetaminophen 5-325 MG per tablet  Commonly known as:  NORCO/VICODIN  Take 1-2 tablets by mouth every 4 (four) hours as needed.     HYDROcodone-acetaminophen 5-325 MG per tablet  Commonly known as:  NORCO/VICODIN  Take 1-2 tablets by mouth every 4 (  four) hours as needed for moderate pain.         SignedCristi Loron: Carsen Machi D 12/04/2013, 7:36 AM

## 2013-12-05 LAB — WOUND CULTURE: Culture: NO GROWTH

## 2013-12-06 LAB — CSF CULTURE

## 2013-12-06 LAB — CSF CULTURE W GRAM STAIN: Culture: NO GROWTH

## 2013-12-08 LAB — ANAEROBIC CULTURE

## 2013-12-08 NOTE — ED Provider Notes (Signed)
CSN: [id number]     Arrival date & time 12/02/13  1836 History   First MD Initiated Contact with Patient 12/02/13 1913     Chief Complaint  Patient presents with  . Headache     (Consider location/radiation/quality/duration/timing/severity/associated sxs/prior Treatment) HPI  38 year old female with headache. Somewhat of a language care. Patient is [place]. She speaks fairly good English and there was some additional assistance from friend at bedside.I felt like I was able toobtain the information I needed without a Nurse, learning disability. She was offered option of translator phone if she felt like she needed to, but she declined. Patient has a past history of VP shunt and subsequent revisions. Worsening headache over the past day. No trauma. No fevers or chills. Associated with photophobia and some mild nausea. No vomiting. No acute numbness, tingling or loss of strength. Typically takes vicodin for her pain, but is currently out.  Past Medical History  Diagnosis Date  . VP (ventriculoperitoneal) shunt status    Past Surgical History  Procedure Laterality Date  . Shunt      brain  . Shunt revision ventricular-peritoneal N/A 12/03/2013    Procedure: Revision of Ventricular Peritoneal Shunt;  Surgeon: Cristi Loron, MD;  Location: [facility];  Service: Neurosurgery;  Laterality: N/A;   Family History  Problem Relation Age of Onset  . Diabetes Father    History  Substance Use Topics  . Smoking status: Never Smoker   . Smokeless tobacco: Never Used  . Alcohol Use: No   OB History   Grav Para Term Preterm Abortions TAB SAB Ect Mult Living                 Review of Systems  All systems reviewed and negative, other than as noted in HPI.   Allergies  Review of patient's allergies indicates no known allergies.  Home Medications   Current Outpatient Rx  Name  Route  Sig  Dispense  Refill  . docusate sodium 100 MG CAPS   Oral   Take 100 mg by mouth 2 (two) times daily.   60  capsule   0   . HYDROcodone-acetaminophen (NORCO/VICODIN) 5-325 MG per tablet   Oral   Take 1-2 tablets by mouth every 4 (four) hours as needed.   25 tablet   0   . HYDROcodone-acetaminophen (NORCO/VICODIN) 5-325 MG per tablet   Oral   Take 1-2 tablets by mouth every 4 (four) hours as needed for moderate pain.   50 tablet   0    BP 119/81  Pulse 68  Temp(Src) 98.5 F (36.9 C) (Oral)  Resp 17  SpO2 100%  LMP 11/25/2013 Physical Exam  Nursing note and vitals reviewed. Constitutional: She appears well-developed and well-nourished. No distress.  HENT:  Head: Normocephalic and atraumatic.  Eyes: Conjunctivae are normal. Right eye exhibits no discharge. Left eye exhibits no discharge.  Neck: Neck supple.  No nuchal rigidity  Cardiovascular: Normal rate, regular rhythm and normal heart sounds.  Exam reveals no gallop and no friction rub.   No murmur heard. Pulmonary/Chest: Effort normal and breath sounds normal. No respiratory distress.  Abdominal: Soft. She exhibits no distension. There is no tenderness.  Musculoskeletal: She exhibits no edema and no tenderness.  Neurological: She is alert. No cranial nerve deficit. She exhibits normal muscle tone. Coordination normal.  Good finger to nose b/l   Skin: Skin is warm and dry.  Psychiatric: She has a normal mood and affect. Her behavior is normal. Thought  content normal.    ED Course  Procedures (including critical care time) Labs Review Labs Reviewed - No data to display Imaging Review No results found.   Ct Head Wo Contrast  12/03/2013   CLINICAL DATA:  Headache.  Evaluate VP shunt status.  EXAM: CT HEAD WITHOUT CONTRAST  TECHNIQUE: Contiguous axial images were obtained from the base of the skull through the vertex without intravenous contrast.  COMPARISON:  One day prior  FINDINGS: Sinuses/Soft tissues: Hypoplastic frontal sinuses. Hyperostosis frontalis interna. Otherwise normal paranasal sinuses and mastoid air cells.   Intracranial: The right-sided VP shunt catheter terminates in the anterior horn of the left lateral ventricle, similar to on the prior exam. The extent of hydrocephalus (lateral and third ventricles only) is moderate and similar to on yesterday's study. Similar to minimally progressive since 02/03/2013. Definitely progressive since 05/20/2007.  Minimal periventricular white matter hypoattenuation is chronic and could relate to small vessel ischemic change or transependymal CSF resorption.  No hemorrhage, mass lesion, intra-axial, or extra-axial fluid collection.  IMPRESSION: 1. Right-sided VP shunt catheter in place. Similar moderate hydrocephalus since 1 day prior. Similar to minimal progression since 02/03/2013. 2. No acute superimposed explanation for headache.   Electronically Signed   By: Jeronimo GreavesKyle  Talbot M.D.   On: 12/03/2013 11:36    EKG Interpretation None      MDM   Final diagnoses:  Headache    37yF with HA. Hx of VP shunt. Imaging as above. Discussed with neurosurgery. With nonfocal neuro exam and HA resolved, feels that she can follow-up in the office. Pt out of vicodin and requesting script.     Raeford RazorStephen Verina Galeno, MD 12/08/13 2035

## 2013-12-22 ENCOUNTER — Encounter: Payer: Self-pay | Admitting: Family Medicine

## 2013-12-22 ENCOUNTER — Ambulatory Visit (INDEPENDENT_AMBULATORY_CARE_PROVIDER_SITE_OTHER): Payer: No Typology Code available for payment source | Admitting: Family Medicine

## 2013-12-22 VITALS — BP 116/80 | HR 82 | Temp 98.0°F | Resp 16 | Ht 63.0 in | Wt 156.0 lb

## 2013-12-22 DIAGNOSIS — Z982 Presence of cerebrospinal fluid drainage device: Secondary | ICD-10-CM

## 2013-12-22 DIAGNOSIS — Z87440 Personal history of urinary (tract) infections: Secondary | ICD-10-CM

## 2013-12-22 DIAGNOSIS — R319 Hematuria, unspecified: Secondary | ICD-10-CM

## 2013-12-22 DIAGNOSIS — R1011 Right upper quadrant pain: Secondary | ICD-10-CM

## 2013-12-22 LAB — POCT CBC
GRANULOCYTE PERCENT: 73.7 % (ref 37–80)
HCT, POC: 41.8 % (ref 37.7–47.9)
Hemoglobin: 13.5 g/dL (ref 12.2–16.2)
LYMPH, POC: 1.6 (ref 0.6–3.4)
MCH, POC: 29 pg (ref 27–31.2)
MCHC: 32.3 g/dL (ref 31.8–35.4)
MCV: 89.9 fL (ref 80–97)
MID (CBC): 0.5 (ref 0–0.9)
MPV: 11.4 fL (ref 0–99.8)
PLATELET COUNT, POC: 244 10*3/uL (ref 142–424)
POC GRANULOCYTE: 5.8 (ref 2–6.9)
POC LYMPH %: 20.2 % (ref 10–50)
POC MID %: 6.1 % (ref 0–12)
RBC: 4.65 M/uL (ref 4.04–5.48)
RDW, POC: 13.5 %
WBC: 7.9 10*3/uL (ref 4.6–10.2)

## 2013-12-22 LAB — POCT UA - MICROSCOPIC ONLY
Casts, Ur, LPF, POC: NEGATIVE
Crystals, Ur, HPF, POC: NEGATIVE
WBC, UR, HPF, POC: NEGATIVE
Yeast, UA: NEGATIVE

## 2013-12-22 LAB — POCT URINALYSIS DIPSTICK
BILIRUBIN UA: NEGATIVE
Glucose, UA: NEGATIVE
KETONES UA: NEGATIVE
Leukocytes, UA: NEGATIVE
Nitrite, UA: NEGATIVE
PROTEIN UA: NEGATIVE
Urobilinogen, UA: 0.2
pH, UA: 6

## 2013-12-22 LAB — POCT URINE PREGNANCY: Preg Test, Ur: NEGATIVE

## 2013-12-22 LAB — GAMMA GT: GGT: 12 U/L (ref 7–51)

## 2013-12-22 NOTE — Patient Instructions (Signed)
Ultrasound tomorrow to look at gallbladder, and we will check urine culture to look at infection, but the blood in the urine and pain can also be coming from a kidney stone.  A CT scan would be needed to see this, but ok to take hydrocodone as you have from Dr. Lovell SheehanJenkins. 1-2 every 4-6 hours as needed for pain. Recheck with Dr. Cleta Albertsaub Thursday morning or Dr. Neva SeatGreene Thursday afternoon to determine if a CT would be needed. Drink lots of water/fluids.  Use the urine filter every time you urinate and if you see a stone in the filter - bring it back to the office at recheck.  Return to the clinic or go to the nearest emergency room if any of your symptoms worsen or new symptoms occur.  Abdominal Pain, Adult Many things can cause abdominal pain. Usually, abdominal pain is not caused by a disease and will improve without treatment. It can often be observed and treated at home. Your health care provider will do a physical exam and possibly order blood tests and X-rays to help determine the seriousness of your pain. However, in many cases, more time must pass before a clear cause of the pain can be found. Before that point, your health care provider may not know if you need more testing or further treatment. HOME CARE INSTRUCTIONS  Monitor your abdominal pain for any changes. The following actions may help to alleviate any discomfort you are experiencing:  Only take over-the-counter or prescription medicines as directed by your health care provider.  Do not take laxatives unless directed to do so by your health care provider.  Try a clear liquid diet (broth, tea, or water) as directed by your health care provider. Slowly move to a bland diet as tolerated. SEEK MEDICAL CARE IF:  You have unexplained abdominal pain.  You have abdominal pain associated with nausea or diarrhea.  You have pain when you urinate or have a bowel movement.  You experience abdominal pain that wakes you in the night.  You have  abdominal pain that is worsened or improved by eating food.  You have abdominal pain that is worsened with eating fatty foods. SEEK IMMEDIATE MEDICAL CARE IF:   Your pain does not go away within 2 hours.  You have a fever.  You keep throwing up (vomiting).  Your pain is felt only in portions of the abdomen, such as the right side or the left lower portion of the abdomen.  You pass bloody or black tarry stools. MAKE SURE YOU:  Understand these instructions.   Will watch your condition.   Will get help right away if you are not doing well or get worse.  Document Released: 07/04/2005 Document Revised: 07/15/2013 Document Reviewed: 06/03/2013 [facility] Patient Information 2014 [place], [place].  Hematuria, Adult Hematuria is blood in your urine. It can be caused by a bladder infection, kidney infection, prostate infection, kidney stone, or cancer of your urinary tract. Infections can usually be treated with medicine, and a kidney stone usually will pass through your urine. If neither of these is the cause of your hematuria, further workup to find out the reason may be needed. It is very important that you tell your health care provider about any blood you see in your urine, even if the blood stops without treatment or happens without causing pain. Blood in your urine that happens and then stops and then happens again can be a symptom of a very serious condition. Also, pain is not a  symptom in the initial stages of many urinary cancers. HOME CARE INSTRUCTIONS   Drink lots of fluid, 3 4 quarts a day. If you have been diagnosed with an infection, cranberry juice is especially recommended, in addition to large amounts of water.  Avoid caffeine, tea, and carbonated beverages, because they tend to irritate the bladder.  Avoid alcohol because it may irritate the prostate.  Only take over-the-counter or prescription medicines for pain, discomfort, or fever as directed by your health care  provider.  If you have been diagnosed with a kidney stone, follow your health care provider's instructions regarding straining your urine to catch the stone.  Empty your bladder often. Avoid holding urine for long periods of time.  After a bowel movement, women should cleanse front to back. Use each tissue only once.  Empty your bladder before and after sexual intercourse if you are a female. SEEK MEDICAL CARE IF: You develop back pain, fever, a feeling of sickness in your stomach (nausea), or vomiting or if your symptoms are not better in 3 days. Return sooner if you are getting worse. SEEK IMMEDIATE MEDICAL CARE IF:   You have a persistent fever, with a temperature of 101.44F (38.8C) or greater.  You develop severe vomiting and are unable to keep the medicine down.  You develop severe back or abdominal pain despite taking your medicines.  You begin passing a large amount of blood or clots in your urine.  You feel extremely weak or faint, or you pass out. MAKE SURE YOU:   Understand these instructions.  Will watch your condition.  Will get help right away if you are not doing well or get worse. Document Released: 09/24/2005 Document Revised: 07/15/2013 Document Reviewed: 05/25/2013 [facility] Patient Information 2014 [place], [place].

## 2013-12-22 NOTE — Progress Notes (Addendum)
Subjective:    Patient ID: [name], female    DOB: 11-18-1975, 38 y.o.   MRN: [id number]  This chart was scribed for Shade Flood, MD by Blanchard Kelch, ED Scribe.  Chief Complaint  Patient presents with  . Abdominal Pain    pt c/o mid right sided abdominal pain x 2-3 days denies nausea, vomiting, diarrhea, or constipation    PCP: No PCP Per Patient   HPI  [name] is a 38 y.o. female with a history of revision of VP shunt on 2/26 for a proximal shunt malfunction. Headache resolved and discharged 12/04/13. Neurosurgeon is Dr. Tressie Stalker.   Abdominal pain: She is complaining of constant RUQ pain that began about three days ago. The pain is worsened at night and when she takes deep breaths. She has been unable to sleep well due to the pain. She denies the pain is worsened by eating. She denies fever, vomiting, nausea, diarrhea, blood in stool, headaches, cough, congestion, dysuria, hematuria, frequency or urgency. She denies alcohol use. She denies recent foreign travel, new or raw foods or sick contacts. She reports a history of UTI at her recent ED visit that she states was resolved on her last Urinalysis prior to discharge.   Patient Active Problem List   Diagnosis Date Noted  . Malfunction of ventriculoperitoneal shunt 12/03/2013  . Cyst of breast 03/17/2012   Past Medical History  Diagnosis Date  . VP (ventriculoperitoneal) shunt status    Past Surgical History  Procedure Laterality Date  . Shunt      brain  . Shunt revision ventricular-peritoneal N/A 12/03/2013    Procedure: Revision of Ventricular Peritoneal Shunt;  Surgeon: Cristi Loron, MD;  Location: [facility];  Service: Neurosurgery;  Laterality: N/A;   No Known Allergies Prior to Admission medications   Medication Sig Start Date End Date Taking? Authorizing Provider  levonorgestrel (MIRENA) 20 MCG/24HR IUD 1 each by Intrauterine route once.   Yes Historical Provider, MD    docusate sodium 100 MG CAPS Take 100 mg by mouth 2 (two) times daily. 12/04/13   Cristi Loron, MD  HYDROcodone-acetaminophen (NORCO/VICODIN) 5-325 MG per tablet Take 1-2 tablets by mouth every 4 (four) hours as needed. 12/02/13   Raeford Razor, MD  HYDROcodone-acetaminophen (NORCO/VICODIN) 5-325 MG per tablet Take 1-2 tablets by mouth every 4 (four) hours as needed for moderate pain. 12/04/13   Cristi Loron, MD   History   Social History  . Marital Status: Married    Spouse Name: N/A    Number of Children: N/A  . Years of Education: N/A   Occupational History  . Not on file.   Social History Main Topics  . Smoking status: Never Smoker   . Smokeless tobacco: Never Used  . Alcohol Use: No  . Drug Use: No  . Sexual Activity: Not on file   Other Topics Concern  . Not on file   Social History Narrative  . No narrative on file    Review of Systems  Constitutional: Negative for fever.  HENT: Negative for congestion.   Respiratory: Negative for cough.   Gastrointestinal: Positive for abdominal pain. Negative for nausea, vomiting, diarrhea and blood in stool.  Genitourinary: Negative for dysuria, urgency and frequency.  Neurological: Negative for headaches.       Objective:   Physical Exam  Nursing note and vitals reviewed. Constitutional: She is oriented to person, place, and time. She appears well-developed and well-nourished. No  distress.  HENT:  Head: Normocephalic and atraumatic.  Eyes: EOM are normal.  Neck: Neck supple. No tracheal deviation present.  Cardiovascular: Normal rate and regular rhythm.  Exam reveals no gallop and no friction rub.   No murmur heard. Pulmonary/Chest: Effort normal and breath sounds normal. No respiratory distress. She has no wheezes. She has no rales.  Abdominal: Bowel sounds are normal. There is tenderness in the right upper quadrant. There is positive Murphy's sign. There is no CVA tenderness and no tenderness at McBurney's point.   Normal active bowel sounds. RUQ tenderness. Positive Murphy's sign. Negative McBurney's point.  Musculoskeletal: Normal range of motion.  No CVA tenderness.   Neurological: She is alert and oriented to person, place, and time.  Skin: Skin is warm and dry.  Psychiatric: She has a normal mood and affect. Her behavior is normal.     Filed Vitals:   12/22/13 0811  BP: 116/80  Pulse: 82  Temp: 98 F (36.7 C)  TempSrc: Oral  Resp: 16  Height: 5\' 3"  (1.6 m)  Weight: 156 lb (70.761 kg)  SpO2: 98%   Results for orders placed in visit on 12/22/13  POCT UA - MICROSCOPIC ONLY      Result Value Ref Range   WBC, Ur, HPF, POC neg     RBC, urine, microscopic 10-15     Bacteria, U Microscopic trace     Mucus, UA trace     Epithelial cells, urine per micros 0-3     Crystals, Ur, HPF, POC neg     Casts, Ur, LPF, POC neg     Yeast, UA neg    POCT URINE PREGNANCY      Result Value Ref Range   Preg Test, Ur Negative    POCT CBC      Result Value Ref Range   WBC 7.9  4.6 - 10.2 K/uL   Lymph, poc 1.6  0.6 - 3.4   POC LYMPH PERCENT 20.2  10 - 50 %L   MID (cbc) 0.5  0 - 0.9   POC MID % 6.1  0 - 12 %M   POC Granulocyte 5.8  2 - 6.9   Granulocyte percent 73.7  37 - 80 %G   RBC 4.65  4.04 - 5.48 M/uL   Hemoglobin 13.5  12.2 - 16.2 g/dL   HCT, POC 16.1  09.6 - 47.9 %   MCV 89.9  80 - 97 fL   MCH, POC 29.0  27 - 31.2 pg   MCHC 32.3  31.8 - 35.4 g/dL   RDW, POC 04.5     Platelet Count, POC 244  142 - 424 K/uL   MPV 11.4  0 - 99.8 fL         Assessment & Plan:   [name] is a 38 y.o. female Abdominal pain, right upper quadrant - Plan: Gamma GT, POCT UA - Microscopic Only, POCT urinalysis dipstick, POCT urine pregnancy, POCT CBC, US Abdomen Limited RUQ  History of urinary tract infection - Plan: POCT UA - Microscopic Only, POCT urinalysis dipstick, POCT urine pregnancy  S/P VP shunt  Hematuria - Plan: Urine culture  RUQ abd pain and positive Murphy's suspicious for  gallbladder source vs. Liver.  reassuring CBC. Will check RUQ u/s tomorrow am.  Hematuria - unlikely infection as asx otherwise.  DDX nephrolith, which may also be causing RUQ pain.  Filter for urine, check urine culture, has hydrocodone if needed from most recent hospitalization. Recheck in  2 days to determine if CT needed. Sooner if worse.    Meds ordered this encounter  Medications  . levonorgestrel (MIRENA) 20 MCG/24HR IUD    Sig: 1 each by Intrauterine route once.   Patient Instructions  Ultrasound tomorrow to look at gallbladder, and we will check urine culture to look at infection, but the blood in the urine and pain can also be coming from a kidney stone.  A CT scan would be needed to see this, but ok to take hydrocodone as you have from Dr. Lovell SheehanJenkins. 1-2 every 4-6 hours as needed for pain. Recheck with Dr. Cleta Albertsaub Thursday morning or Dr. Neva SeatGreene Thursday afternoon to determine if a CT would be needed. Drink lots of water/fluids.  Use the urine filter every time you urinate and if you see a stone in the filter - bring it back to the office at recheck.  Return to the clinic or go to the nearest emergency room if any of your symptoms worsen or new symptoms occur.  Abdominal Pain, Adult Many things can cause abdominal pain. Usually, abdominal pain is not caused by a disease and will improve without treatment. It can often be observed and treated at home. Your health care provider will do a physical exam and possibly order blood tests and X-rays to help determine the seriousness of your pain. However, in many cases, more time must pass before a clear cause of the pain can be found. Before that point, your health care provider may not know if you need more testing or further treatment. HOME CARE INSTRUCTIONS  Monitor your abdominal pain for any changes. The following actions may help to alleviate any discomfort you are experiencing:  Only take over-the-counter or prescription medicines as directed by  your health care provider.  Do not take laxatives unless directed to do so by your health care provider.  Try a clear liquid diet (broth, tea, or water) as directed by your health care provider. Slowly move to a bland diet as tolerated. SEEK MEDICAL CARE IF:  You have unexplained abdominal pain.  You have abdominal pain associated with nausea or diarrhea.  You have pain when you urinate or have a bowel movement.  You experience abdominal pain that wakes you in the night.  You have abdominal pain that is worsened or improved by eating food.  You have abdominal pain that is worsened with eating fatty foods. SEEK IMMEDIATE MEDICAL CARE IF:   Your pain does not go away within 2 hours.  You have a fever.  You keep throwing up (vomiting).  Your pain is felt only in portions of the abdomen, such as the right side or the left lower portion of the abdomen.  You pass bloody or black tarry stools. MAKE SURE YOU:  Understand these instructions.   Will watch your condition.   Will get help right away if you are not doing well or get worse.  Document Released: 07/04/2005 Document Revised: 07/15/2013 Document Reviewed: 06/03/2013 [facility] Patient Information 2014 [place], [place].  Hematuria, Adult Hematuria is blood in your urine. It can be caused by a bladder infection, kidney infection, prostate infection, kidney stone, or cancer of your urinary tract. Infections can usually be treated with medicine, and a kidney stone usually will pass through your urine. If neither of these is the cause of your hematuria, further workup to find out the reason may be needed. It is very important that you tell your health care provider about any blood you see in  your urine, even if the blood stops without treatment or happens without causing pain. Blood in your urine that happens and then stops and then happens again can be a symptom of a very serious condition. Also, pain is not a symptom in the  initial stages of many urinary cancers. HOME CARE INSTRUCTIONS   Drink lots of fluid, 3 4 quarts a day. If you have been diagnosed with an infection, cranberry juice is especially recommended, in addition to large amounts of water.  Avoid caffeine, tea, and carbonated beverages, because they tend to irritate the bladder.  Avoid alcohol because it may irritate the prostate.  Only take over-the-counter or prescription medicines for pain, discomfort, or fever as directed by your health care provider.  If you have been diagnosed with a kidney stone, follow your health care provider's instructions regarding straining your urine to catch the stone.  Empty your bladder often. Avoid holding urine for long periods of time.  After a bowel movement, women should cleanse front to back. Use each tissue only once.  Empty your bladder before and after sexual intercourse if you are a female. SEEK MEDICAL CARE IF: You develop back pain, fever, a feeling of sickness in your stomach (nausea), or vomiting or if your symptoms are not better in 3 days. Return sooner if you are getting worse. SEEK IMMEDIATE MEDICAL CARE IF:   You have a persistent fever, with a temperature of 101.15F (38.8C) or greater.  You develop severe vomiting and are unable to keep the medicine down.  You develop severe back or abdominal pain despite taking your medicines.  You begin passing a large amount of blood or clots in your urine.  You feel extremely weak or faint, or you pass out. MAKE SURE YOU:   Understand these instructions.  Will watch your condition.  Will get help right away if you are not doing well or get worse. Document Released: 09/24/2005 Document Revised: 07/15/2013 Document Reviewed: 05/25/2013 [facility] Patient Information 2014 [place], [place].      I personally performed the services described in this documentation, which was scribed in my presence. The recorded information has been reviewed and  considered, and addended by me as needed.

## 2013-12-23 ENCOUNTER — Ambulatory Visit
Admission: RE | Admit: 2013-12-23 | Discharge: 2013-12-23 | Disposition: A | Discharge status: Home / Self Care | Payer: No Typology Code available for payment source | Source: Ambulatory Visit | Attending: Family Medicine | Admitting: Family Medicine

## 2013-12-23 DIAGNOSIS — R1011 Right upper quadrant pain: Secondary | ICD-10-CM

## 2013-12-23 LAB — URINE CULTURE

## 2013-12-24 ENCOUNTER — Ambulatory Visit (INDEPENDENT_AMBULATORY_CARE_PROVIDER_SITE_OTHER): Payer: No Typology Code available for payment source | Admitting: Emergency Medicine

## 2013-12-24 VITALS — BP 110/64 | HR 83 | Temp 97.5°F | Resp 18 | Ht 63.0 in | Wt 153.0 lb

## 2013-12-24 DIAGNOSIS — R109 Unspecified abdominal pain: Secondary | ICD-10-CM

## 2013-12-24 DIAGNOSIS — R319 Hematuria, unspecified: Secondary | ICD-10-CM

## 2013-12-24 LAB — POCT CBC
GRANULOCYTE PERCENT: 63.5 % (ref 37–80)
HCT, POC: 40.9 % (ref 37.7–47.9)
Hemoglobin: 13.6 g/dL (ref 12.2–16.2)
LYMPH, POC: 1.8 (ref 0.6–3.4)
MCH, POC: 29.6 pg (ref 27–31.2)
MCHC: 33.3 g/dL (ref 31.8–35.4)
MCV: 89 fL (ref 80–97)
MID (cbc): 0.6 (ref 0–0.9)
MPV: 10.8 fL (ref 0–99.8)
POC Granulocyte: 4.1 (ref 2–6.9)
POC LYMPH %: 27.9 % (ref 10–50)
POC MID %: 8.6 % (ref 0–12)
Platelet Count, POC: 266 10*3/uL (ref 142–424)
RBC: 4.59 M/uL (ref 4.04–5.48)
RDW, POC: 12.8 %
WBC: 6.4 10*3/uL (ref 4.6–10.2)

## 2013-12-24 LAB — COMPREHENSIVE METABOLIC PANEL
ALT: 10 U/L (ref 0–35)
AST: 12 U/L (ref 0–37)
Albumin: 4.1 g/dL (ref 3.5–5.2)
Alkaline Phosphatase: 58 U/L (ref 39–117)
BILIRUBIN TOTAL: 1.2 mg/dL (ref 0.2–1.2)
BUN: 15 mg/dL (ref 6–23)
CO2: 23 mEq/L (ref 19–32)
Calcium: 9 mg/dL (ref 8.4–10.5)
Chloride: 108 mEq/L (ref 96–112)
Creat: 0.69 mg/dL (ref 0.50–1.10)
Glucose, Bld: 79 mg/dL (ref 70–99)
Potassium: 3.6 mEq/L (ref 3.5–5.3)
Sodium: 140 mEq/L (ref 135–145)
Total Protein: 6.7 g/dL (ref 6.0–8.3)

## 2013-12-24 LAB — POCT UA - MICROSCOPIC ONLY
Casts, Ur, LPF, POC: NEGATIVE
Crystals, Ur, HPF, POC: NEGATIVE
Mucus, UA: POSITIVE
YEAST UA: NEGATIVE

## 2013-12-24 LAB — LIPASE: LIPASE: 10 U/L (ref 0–75)

## 2013-12-24 LAB — POCT URINALYSIS DIPSTICK
BILIRUBIN UA: NEGATIVE
Glucose, UA: NEGATIVE
KETONES UA: NEGATIVE
Leukocytes, UA: NEGATIVE
NITRITE UA: NEGATIVE
Spec Grav, UA: 1.02
Urobilinogen, UA: 1
pH, UA: 6

## 2013-12-24 LAB — POCT URINE PREGNANCY: Preg Test, Ur: NEGATIVE

## 2013-12-24 LAB — AMYLASE: Amylase: 53 U/L (ref 0–105)

## 2013-12-24 LAB — POCT SEDIMENTATION RATE: POCT SED RATE: 40 mm/hr — AB (ref 0–22)

## 2013-12-24 NOTE — Patient Instructions (Signed)
[facility] [address] or [id number].

## 2013-12-24 NOTE — Progress Notes (Signed)
Subjective:    Patient ID: [name], female    DOB: 09/18/1976, 38 y.o.   MRN: [id number]  HPI 38 year old pt presents for a follow up of abdominal pain. She had an US performed yesterday and they do not think her problem is her gallbladder. The radiologist suggested a MRI of the abdomen with contrast to look at the liver. Her abd pain started after her ventriculoperitoneal shunt was revised. She has had the pain for 4-5 days; the pain hasn't changed. The pain is on the right side of her abdomen. She's been taking Aleve to help with the pain. States no known injury. No loss of appetite. No nausea or diarrhea. No fever. Dr. Lovell SheehanJenkins does not know she is having the abdominal pain; she is scheduled to see him in 2 months.   Review of Systems     Objective:   Physical Exam and cooperative she is not ill appearing. She has a shunt palpable on the right side of her neck. Her chest was clear. Cardiac exam reveals a regular rate without murmurs. Abdominal exam reveals tenderness in the right upper abdomen without rebound.  Results for orders placed in visit on 12/24/13  POCT UA - MICROSCOPIC ONLY      Result Value Ref Range   WBC, Ur, HPF, POC 0-1     RBC, urine, microscopic 12-18     Bacteria, U Microscopic 1+     Mucus, UA positive     Epithelial cells, urine per micros 0-4     Crystals, Ur, HPF, POC neg     Casts, Ur, LPF, POC neg     Yeast, UA neg    POCT URINALYSIS DIPSTICK      Result Value Ref Range   Color, UA yellow     Clarity, UA clear     Glucose, UA neg     Bilirubin, UA neg     Ketones, UA neg     Spec Grav, UA 1.020     Blood, UA large     pH, UA 6.0     Protein, UA trace     Urobilinogen, UA 1.0     Nitrite, UA neg     Leukocytes, UA Negative    POCT URINE PREGNANCY      Result Value Ref Range   Preg Test, Ur Negative    POCT CBC      Result Value Ref Range   WBC 6.4  4.6 - 10.2 K/uL   Lymph, poc 1.8  0.6 - 3.4   POC LYMPH PERCENT 27.9  10 - 50 %L   MID (cbc) 0.6  0 - 0.9   POC MID % 8.6  0 - 12 %M   POC Granulocyte 4.1  2 - 6.9   Granulocyte percent 63.5  37 - 80 %G   RBC 4.59  4.04 - 5.48 M/uL   Hemoglobin 13.6  12.2 - 16.2 g/dL   HCT, POC 91.440.9  78.237.7 - 47.9 %   MCV 89.0  80 - 97 fL   MCH, POC 29.6  27 - 31.2 pg   MCHC 33.3  31.8 - 35.4 g/dL   RDW, POC 95.612.8     Platelet Count, POC 266  142 - 424 K/uL   MPV 10.8  0 - 99.8 fL        Assessment & Plan:  I discussed case with Dr. Lovell SheehanJenkins. I told him I would notify him if they saw anything on  the CT scan. There is an abnormal area of bowel ultrasound would need to follow through on. Her physical exam today revealed only mild right upper abdominal discomfort   and she is having her CT tomorrow .

## 2013-12-25 ENCOUNTER — Ambulatory Visit
Admission: RE | Admit: 2013-12-25 | Discharge: 2013-12-25 | Disposition: A | Discharge status: Home / Self Care | Payer: No Typology Code available for payment source | Source: Ambulatory Visit | Attending: Emergency Medicine | Admitting: Emergency Medicine

## 2013-12-25 ENCOUNTER — Telehealth: Payer: Self-pay | Admitting: *Deleted

## 2013-12-25 DIAGNOSIS — R319 Hematuria, unspecified: Secondary | ICD-10-CM

## 2013-12-25 DIAGNOSIS — R109 Unspecified abdominal pain: Secondary | ICD-10-CM

## 2013-12-25 MED ORDER — IOHEXOL 300 MG/ML  SOLN
100.0000 mL | Freq: Once | INTRAMUSCULAR | Status: AC | PRN
  Administered 2013-12-25: 100 mL via INTRAVENOUS

## 2013-12-25 MED ORDER — IOHEXOL 300 MG/ML  SOLN
40.0000 mL | Freq: Once | INTRAMUSCULAR | Status: AC | PRN
  Administered 2013-12-25: 40 mL via ORAL

## 2013-12-25 NOTE — Telephone Encounter (Signed)
[facility] to notify that we needed to change the CT order to with and without contrast. VO from Dr. Cleta Albertsaub. No precert needed per Prior Auth. Order has been entered.

## 2013-12-27 ENCOUNTER — Other Ambulatory Visit: Payer: Self-pay

## 2013-12-27 DIAGNOSIS — R109 Unspecified abdominal pain: Secondary | ICD-10-CM

## 2013-12-28 ENCOUNTER — Encounter (HOSPITAL_COMMUNITY): Payer: Self-pay | Admitting: Emergency Medicine

## 2013-12-28 DIAGNOSIS — Z982 Presence of cerebrospinal fluid drainage device: Secondary | ICD-10-CM | POA: Insufficient documentation

## 2013-12-28 DIAGNOSIS — Z3202 Encounter for pregnancy test, result negative: Secondary | ICD-10-CM | POA: Insufficient documentation

## 2013-12-28 DIAGNOSIS — R059 Cough, unspecified: Secondary | ICD-10-CM | POA: Insufficient documentation

## 2013-12-28 DIAGNOSIS — Z975 Presence of (intrauterine) contraceptive device: Secondary | ICD-10-CM | POA: Insufficient documentation

## 2013-12-28 DIAGNOSIS — R0602 Shortness of breath: Secondary | ICD-10-CM | POA: Insufficient documentation

## 2013-12-28 DIAGNOSIS — J90 Pleural effusion, not elsewhere classified: Secondary | ICD-10-CM | POA: Insufficient documentation

## 2013-12-28 DIAGNOSIS — R05 Cough: Secondary | ICD-10-CM | POA: Insufficient documentation

## 2013-12-28 LAB — URINE MICROSCOPIC-ADD ON

## 2013-12-28 LAB — URINALYSIS, ROUTINE W REFLEX MICROSCOPIC
BILIRUBIN URINE: NEGATIVE
Glucose, UA: NEGATIVE mg/dL
Ketones, ur: NEGATIVE mg/dL
Leukocytes, UA: NEGATIVE
NITRITE: NEGATIVE
PH: 7 (ref 5.0–8.0)
PROTEIN: NEGATIVE mg/dL
Specific Gravity, Urine: 1.017 (ref 1.005–1.030)
Urobilinogen, UA: 2 mg/dL — ABNORMAL HIGH (ref 0.0–1.0)

## 2013-12-28 LAB — HELICOBACTER PYLORI  ANTIBODY, IGM: Helicobacter pylori, IgM: 4.1 U/mL (ref <9.0)

## 2013-12-28 NOTE — ED Notes (Signed)
Pt. reports RUQ pain with occasional dry cough for 2 weeks , denies nausea/vomitting or diarrhea. Denies fever or chills. No urinary discomfort.

## 2013-12-29 ENCOUNTER — Encounter (HOSPITAL_COMMUNITY): Payer: Self-pay | Admitting: Radiology

## 2013-12-29 ENCOUNTER — Telehealth: Payer: Self-pay | Admitting: Radiology

## 2013-12-29 ENCOUNTER — Telehealth: Payer: Self-pay

## 2013-12-29 ENCOUNTER — Emergency Department (HOSPITAL_COMMUNITY)
Admission: EM | Admit: 2013-12-29 | Discharge: 2013-12-29 | Disposition: A | Discharge status: Home / Self Care | Payer: No Typology Code available for payment source | Attending: Emergency Medicine | Admitting: Emergency Medicine

## 2013-12-29 ENCOUNTER — Emergency Department (HOSPITAL_COMMUNITY): Payer: No Typology Code available for payment source

## 2013-12-29 ENCOUNTER — Other Ambulatory Visit: Payer: Self-pay | Admitting: Radiology

## 2013-12-29 DIAGNOSIS — R059 Cough, unspecified: Secondary | ICD-10-CM

## 2013-12-29 DIAGNOSIS — J90 Pleural effusion, not elsewhere classified: Secondary | ICD-10-CM

## 2013-12-29 DIAGNOSIS — R05 Cough: Secondary | ICD-10-CM

## 2013-12-29 DIAGNOSIS — Z982 Presence of cerebrospinal fluid drainage device: Secondary | ICD-10-CM

## 2013-12-29 LAB — CBC WITH DIFFERENTIAL/PLATELET
Basophils Absolute: 0.1 10*3/uL (ref 0.0–0.1)
Basophils Relative: 1 % (ref 0–1)
EOS PCT: 8 % — AB (ref 0–5)
Eosinophils Absolute: 0.6 10*3/uL (ref 0.0–0.7)
HCT: 37 % (ref 36.0–46.0)
HEMOGLOBIN: 13 g/dL (ref 12.0–15.0)
LYMPHS ABS: 1.8 10*3/uL (ref 0.7–4.0)
LYMPHS PCT: 22 % (ref 12–46)
MCH: 29.9 pg (ref 26.0–34.0)
MCHC: 35.1 g/dL (ref 30.0–36.0)
MCV: 85.1 fL (ref 78.0–100.0)
Monocytes Absolute: 0.8 10*3/uL (ref 0.1–1.0)
Monocytes Relative: 10 % (ref 3–12)
Neutro Abs: 4.8 10*3/uL (ref 1.7–7.7)
Neutrophils Relative %: 59 % (ref 43–77)
PLATELETS: 266 10*3/uL (ref 150–400)
RBC: 4.35 MIL/uL (ref 3.87–5.11)
RDW: 13 % (ref 11.5–15.5)
WBC: 8.1 10*3/uL (ref 4.0–10.5)

## 2013-12-29 LAB — COMPREHENSIVE METABOLIC PANEL
ALK PHOS: 70 U/L (ref 39–117)
ALT: 10 U/L (ref 0–35)
AST: 13 U/L (ref 0–37)
Albumin: 3.4 g/dL — ABNORMAL LOW (ref 3.5–5.2)
BUN: 12 mg/dL (ref 6–23)
CALCIUM: 9 mg/dL (ref 8.4–10.5)
CO2: 22 meq/L (ref 19–32)
Chloride: 106 mEq/L (ref 96–112)
Creatinine, Ser: 0.73 mg/dL (ref 0.50–1.10)
GFR calc non Af Amer: >90 mL/min (ref >90)
GLUCOSE: 105 mg/dL — AB (ref 70–99)
Potassium: 3.6 mEq/L — ABNORMAL LOW (ref 3.7–5.3)
Sodium: 141 mEq/L (ref 137–147)
Total Bilirubin: 0.4 mg/dL (ref 0.3–1.2)
Total Protein: 7.2 g/dL (ref 6.0–8.3)

## 2013-12-29 LAB — D-DIMER, QUANTITATIVE: D-Dimer, Quant: 2.8 ug/mL-FEU — ABNORMAL HIGH (ref 0.00–0.48)

## 2013-12-29 LAB — LIPASE, BLOOD: Lipase: 22 U/L (ref 11–59)

## 2013-12-29 MED ORDER — OXYCODONE-ACETAMINOPHEN 5-325 MG PO TABS
1.0000 | ORAL_TABLET | Freq: Once | ORAL | Status: AC
  Administered 2013-12-29: 1 via ORAL
  Filled 2013-12-29: qty 1

## 2013-12-29 MED ORDER — OXYCODONE-ACETAMINOPHEN 5-325 MG PO TABS: 1.0000 | ORAL_TABLET | Freq: Four times a day (QID) | ORAL | Status: DC | PRN

## 2013-12-29 MED ORDER — IOHEXOL 350 MG/ML SOLN
80.0000 mL | Freq: Once | INTRAVENOUS | Status: AC | PRN
  Administered 2013-12-29: 60 mL via INTRAVENOUS

## 2013-12-29 NOTE — ED Provider Notes (Signed)
CSN: [id number]     Arrival date & time 12/28/13  2250 History   First MD Initiated Contact with Patient 12/29/13 0354     Chief Complaint  Patient presents with  . Abdominal Pain    (Consider location/radiation/quality/duration/timing/severity/associated sxs/prior Treatment) HPI Comments: Patient is a 38 year old female with a history of VP shunt with revision on 12/03/2013 by Dr. Marlyne Beards who presents to the emergency department for pain under her right rib. Patient states that pain began 2 weeks ago and has been constant since onset. Patient states that pain is worse at nighttime and has been associated with an intermittent dry nonproductive cough. Patient states that she has experienced some mild shortness of breath at night associated with her symptoms. She denies any alleviating factors the pain has not been improved by NSAIDs. Patient denies associated fever, syncope, near syncope, hemoptysis, nausea or vomiting, diarrhea, melena or hematochezia, urinary symptoms, or numbness/weakness. Patient denies history of blood clots. She does currently have an intrauterine device.  The history is provided by the patient. History limited by: [place] speaking, but communicates well in [place]. No language interpreter was used.    Past Medical History  Diagnosis Date  . VP (ventriculoperitoneal) shunt status    Past Surgical History  Procedure Laterality Date  . Shunt      brain  . Shunt revision ventricular-peritoneal N/A 12/03/2013    Procedure: Revision of Ventricular Peritoneal Shunt;  Surgeon: Cristi Loron, MD;  Location: [facility];  Service: Neurosurgery;  Laterality: N/A;   Family History  Problem Relation Age of Onset  . Diabetes Father    History  Substance Use Topics  . Smoking status: Never Smoker   . Smokeless tobacco: Never Used  . Alcohol Use: No   OB History   Grav Para Term Preterm Abortions TAB SAB Ect Mult Living                  Review of Systems   Constitutional: Negative for fever.  Respiratory: Positive for cough and shortness of breath. Negative for wheezing.   Cardiovascular: Negative for chest pain.  Gastrointestinal: Negative for nausea, vomiting and diarrhea.  Genitourinary: Negative for dysuria.  Musculoskeletal: Positive for myalgias.  Neurological: Negative for syncope.  All other systems reviewed and are negative.     Allergies  Review of patient's allergies indicates no known allergies.  Home Medications   Current Outpatient Rx  Name  Route  Sig  Dispense  Refill  . levonorgestrel (MIRENA) 20 MCG/24HR IUD   Intrauterine   1 each by Intrauterine route once.         . Multiple Vitamin (MULTIVITAMIN WITH MINERALS) TABS tablet   Oral   Take 1 tablet by mouth daily.         . naproxen sodium (ANAPROX) 220 MG tablet   Oral   Take 220 mg by mouth daily as needed (for pain).          BP 111/53  Pulse 74  Temp(Src) 98.6 F (37 C) (Oral)  Resp 20  SpO2 100%  LMP 12/01/2013  Physical Exam  Nursing note and vitals reviewed. Constitutional: She is oriented to person, place, and time. She appears well-developed and well-nourished. No distress.  HENT:  Head: Normocephalic and atraumatic.  Mouth/Throat: Oropharynx is clear and moist. No oropharyngeal exudate.  Eyes: Conjunctivae and EOM are normal. Pupils are equal, round, and reactive to light. No scleral icterus.  Neck: Normal range of motion.  Cardiovascular: Normal rate,  regular rhythm and normal heart sounds.   Pulmonary/Chest: Effort normal. No respiratory distress. She has no wheezes. She has no rales.  Dry cough appreciated at bedside sporadically.  Abdominal: Soft. She exhibits no distension. There is no rebound and no guarding.  Mild tenderness when patient palpated up and under her ribcage just right of her epigastric region. Negative Murphy's sign. No peritoneal signs.  Musculoskeletal: Normal range of motion.  Neurological: She is alert  and oriented to person, place, and time.  Skin: Skin is warm and dry. No rash noted. She is not diaphoretic. No erythema. No pallor.  Psychiatric: She has a normal mood and affect. Her behavior is normal.    ED Course  Procedures (including critical care time) Labs Review Labs Reviewed  CBC WITH DIFFERENTIAL - Abnormal; Notable for the following:    Eosinophils Relative 8 (*)    All other components within normal limits  COMPREHENSIVE METABOLIC PANEL - Abnormal; Notable for the following:    Potassium 3.6 (*)    Glucose, Bld 105 (*)    Albumin 3.4 (*)    All other components within normal limits  URINALYSIS, ROUTINE W REFLEX MICROSCOPIC - Abnormal; Notable for the following:    Hgb urine dipstick MODERATE (*)    Urobilinogen, UA 2.0 (*)    All other components within normal limits  URINE MICROSCOPIC-ADD ON - Abnormal; Notable for the following:    Squamous Epithelial / LPF FEW (*)    Bacteria, UA FEW (*)    All other components within normal limits  D-DIMER, QUANTITATIVE - Abnormal; Notable for the following:    D-Dimer, Quant 2.80 (*)    All other components within normal limits  LIPASE, BLOOD  POC URINE PREG, ED   Imaging Review Dg Chest 2 View  12/29/2013   CLINICAL DATA:  Right upper quadrant abdominal pain.  Cough.  EXAM: CHEST  2 VIEW  COMPARISON:  Chest x-ray 12/02/2013.  FINDINGS: Lung volumes are normal. No consolidative airspace disease. No pleural effusions. No pneumothorax. No pulmonary nodule or mass noted. Pulmonary vasculature and the cardiomediastinal silhouette are within normal limits. Right-sided ventriculopleural shunt again noted.  IMPRESSION: No radiographic evidence of acute cardiopulmonary disease.   Electronically Signed   By: Trudie Reed M.D.   On: 12/29/2013 03:27   Ct Angio Chest Pe W/cm &/or Wo Cm  12/29/2013   ADDENDUM REPORT: 12/29/2013 06:08  ADDENDUM: When compared to CT of the abdomen and pelvis December 25, 2013, the right pleural effusion  appears new, and, the shunt tip appears to have migrated to the posterior pleural space.  Findings discussed with and reconfirmed by Dr.Benjaman Artman on 12/29/2013 at approximately 6:05 a.m.   Electronically Signed   By: Awilda Metro   On: 12/29/2013 06:08   12/29/2013   CLINICAL DATA:  Elevated D-dimer, right upper quadrant pain with occasional dry cough.  EXAM: CT ANGIOGRAPHY CHEST WITH CONTRAST  TECHNIQUE: Multidetector CT imaging of the chest was performed using the standard protocol during bolus administration of intravenous contrast. Multiplanar CT image reconstructions and MIPs were obtained to evaluate the vascular anatomy.  CONTRAST:  60mL OMNIPAQUE IOHEXOL 350 MG/ML SOLN  COMPARISON:  DG CHEST 2 VIEW dated 12/29/2013  FINDINGS: Main pulmonary artery is not enlarged. No pulmonary arterial filling defects to the level of the subsegmental branches.  Small right pleural effusion with ventriculopleural shunt in place, catheter appears intact, terminating in the posterior pleural space. No rim enhancing pleural fluid collections. Mild right lower  lobe dependent atelectasis without focal consolidation. Tracheobronchial tree is patent and midline with trace bronchial wall thickening. Heart and pericardium are unremarkable. Thoracic aorta is normal course and caliber. Minimal fullness of the mediastinal soft tissues could reflect residual or reactivated thymic tissue. No lymphadenopathy by CT size criteria.  Bilateral breast nodules, largest on the right measuring 2 x 1 cm, axial 45/78. 6 mm left parasagittal anterior abdominal wall subcutaneous nodule could reflect lymph node. Included view of the abdomen is unremarkable. Mild degenerative change of the thoracic spine.  Review of the MIP images confirms the above findings.  IMPRESSION: No pulmonary embolism.  Ventriculopleural shunt terminates in the right lung base with small right pleural effusion. No CT findings of empyema. Minimal right lung base  atelectasis.  Trace bronchial wall thickening could reflect bronchitis.  Bilateral breast nodules, largest measuring 2 x 1 cm on the right, consider mammogram.  Electronically Signed: By: Awilda Metroourtnay  Blo[place] On: 12/29/2013 05:20     EKG Interpretation None      MDM   Final diagnoses:  Pleural effusion  Presence of ventriculopleural shunt  Cough    38 year old female with a history of VP shunt revision on 12/03/2013 presents to the emergency department for right lower chest/right upper abdominal pain. Pain is appreciated to be more pleuritic than abdominal in nature and patient stated symptoms were associated with a cough. Patient had no leukocytosis today. No anemia. Vitals stable. Patient without tachypnea, dyspnea, or hypoxia. D-dimer ordered for further evaluation of symptoms as patient had history of recent surgery and IUD placement. D-dimer was elevated which led to a CT scan of patient's chest to rule out PE. CT chest showed no pulmonary embolism, but small right pleural effusion was noted. Of note, patient did have a negative CT abdomen and pelvis on 12/25/2013 which was negative for intra-abdominopelvic processes.  I consulted with Dr. Danielle DessElsner of neurosurgery regarding these results. Patient's operative note made mention of ventriculoperitoneal shunt revision. Consultation was ordered to ensure that symptoms and CT findings were not the result of shunt migration. Dr. Danielle DessElsner states that patient's ventriculoperitoneal shunt failed during revision causing the need for ventriculopleural shunt placement. He states that CT findings today are normal and expected. They are likely the result of patient's symptoms today. Patient stable for outpatient followup with her neurosurgeon as needed. Will prescribe Percocet as needed for pain control. Return precautions provided. Patient agreeable to plan with no unaddressed concerns.   Filed Vitals:   12/29/13 0159 12/29/13 0300 12/29/13 0438 12/29/13 0530   BP: 110/70 111/66 111/53   Pulse: 80 88 80 74  Temp: 98.6 F (37 C)     TempSrc: Oral     Resp: 18  18 20   SpO2: 100% 100% 100% 100%       Antony MaduraKelly Cuong Moorman, PA-C 12/29/13 [id number]

## 2013-12-29 NOTE — ED Notes (Signed)
Pt becoming increasingly agitated, this rn talked to the pt and family calm and cooperative at this time.

## 2013-12-29 NOTE — ED Provider Notes (Signed)
Medical screening examination/treatment/procedure(s) were performed by non-physician practitioner and as supervising physician I was immediately available for consultation/collaboration.   EKG Interpretation None        Lisa SeamenJohn L Tore Carreker, MD 12/29/13 [phone]

## 2013-12-29 NOTE — Telephone Encounter (Signed)
Called patient about labs.  Patient states she was feeling worse last night and went to ER.  Patient states she is doing better and is going to see another Provider tomorrow about her abdominal pain.

## 2013-12-29 NOTE — Discharge Instructions (Signed)
Il tuo TAC mostra una piccola quantit di raccolta del fluido alla base del polmone destro . Questo  da il posizionamento shunt . Ho parlato con un collega del neurochirurgo che afferma che questa constatazione  normale . I sintomi sono probabilmente il risultato di questa piccola raccolta fluida . Consiglia Percocet come prescritto come necessario per il dolore severo . Si pu followup con il neurochirurgo come necessari se i sintomi persistono o peggiorano .  Your CT scan shows a small amount of fluid collecting at the base of your right lung. This is from your shunt placement. I have talked to a colleague of your neurosurgeon who states that this finding is normal. Your symptoms are likely a result of this small fluid collection. Recommend Percocet as prescribed as needed for severe pain. You may followup with your neurosurgeon as needed if symptoms persist or worsen.  Pleural Effusion The lining covering your lungs and the inside of your chest is called the pleura. Usually, the space between the 2 pleura contains no air and only a thin layer of fluid. A pleural effusion is an abnormal buildup of fluid in the pleural space. Fluid gathers when there is increased pressure in the lung vessels. This forces fluids out of the lungs and into the pleural space. Vessels may also leak fluids when there are infections, such as pneumonia, or other causes of soreness and redness (inflammation). Fluids leak into the lungs when protein in the blood is low or when certain vessels (lymphatics) are blocked. Finding a pleural effusion is important because it is usually caused by another disease. In order to treat a pleural effusion, your caregiver needs to find its cause. If left untreated, a large amount of fluid can build up and cause collapse of the lung. CAUSES   Heart failure.  Infections (pneumonia, tuberculosis), pulmonary embolism, pulmonary infarction.  Cancer (primary lung and metastatic),  asbestosis.  Liver failure (cirrhosis).  Nephrotic syndrome, peritoneal dialysis, kidney problems (uremia).  Collagen vascular disease (systemic lupus erythematosis, rheumatoid arthritis).  Injury (trauma) to the chest or rupture of the digestive tube (esophagus).  Material in the chest or pleural space (hemothorax, chylothorax).  Pancreatitis.  Surgery.  Drug reactions. SYMPTOMS  A pleural effusion can decrease the amount of space available for breathing and make you short of breath. The fluid can become infected, which may cause pain and fever. Often, the pain is worse when taking a deep breath. The underlying disease (heart failure, pneumonia, blood clot, tuberculosis, cancer) may also cause symptoms. DIAGNOSIS   Your caregiver can usually tell what is wrong by talking to you (taking a history), doing an exam, and taking a routine X-ray. If the X-ray shows fluid in your chest, often fluid is removed from your chest with a needle for testing (diagnostic thoracentesis).  Sometimes, more specialized X-rays may be needed.  Sometimes, a small piece of tissue is removed and examined by a specialist (biopsy). TREATMENT  Treatment varies based on what caused the pleural effusion. Treatments include:  Removing as much fluid as possible using a needle (thoracentesis) to improve the cough and shortness of breath. This is a simple procedure which can be done at bedside. The risks are bleeding, infection, collapse of a lung, or low blood pressure.  Placing a tube in the chest to drain the effusion (tube thoracostomy). This is often used when there is an infection in the fluid. This is a simple procedure which can often be done at bedside or in a clinic.  The procedure may be painful. The risks are the same as using a needle to drain the fluid. The chest tube usually remains for a few days and is connected to suction to improve fluid drainage. The tube, after placement, usually does not cause much  discomfort.  Surgical removal of fibrous debris in and around the pleural space (decortication). This may be done with a flexible telescope (thoracoscope) through a small or large cut (incision). This is helpful for patients who have fibrosis or scar tissue that prevents complete lung expansion. The risks are infection, blood loss, and side effects from general anesthesia.  Sometimes, a procedure called pleurodesis is done. A chest tube is placed and the fluid is drained. Next, an agent (tetracycline, talc powder) is added to the pleural space. This causes the lung and chest wall to stick together (adhesion). This leaves no potential space for fluid to build up. The risks include infection, blood loss, and side effects from general anesthesia.  If the effusion is caused by infection, it may be treated with antibiotics and improve without draining. HOME CARE INSTRUCTIONS   Take any medicines exactly as prescribed.  Follow up with your caregiver as directed.  Monitor your exercise capacity (the amount of walking you can do before you get short of breath).  Do not smoke. Ask your caregiver for help quitting. SEEK MEDICAL CARE IF:   Your exercise capacity seems to get worse or does not improve with time.  You do not recover from your illness. SEEK IMMEDIATE MEDICAL CARE IF:   Shortness of breath or chest pain develops or gets worse.  You have an oral temperature above 102 F (38.9 C), not controlled by medicine.  You develop a new cough, especially if the mucus (phlegm) is discolored. MAKE SURE YOU:   Understand these instructions.  Will watch your condition.  Will get help right away if you are not doing well or get worse. Document Released: 09/24/2005 Document Revised: 07/15/2013 Document Reviewed: 05/16/2007 [facility] Patient Information 2014 [employer], [place].

## 2013-12-29 NOTE — Telephone Encounter (Signed)
I have left message for patient to call me back. I need to know if she has GI appt scheduled. She has declined appt when she was called regarding the referral.  Dr Cleta Albertsaub has spoken to Dr Lovell SheehanJenkins concerning patient and Dr Lovell SheehanJenkins is aware of the situation regarding her shunt position. Dr Cleta Albertsaub is also wanting me to speak to her regarding her follow up plan, she will need follow up xrays, will she follow up here or does she have a PCP to follow her?

## 2013-12-30 ENCOUNTER — Encounter (HOSPITAL_COMMUNITY): Payer: Self-pay | Admitting: Emergency Medicine

## 2013-12-30 ENCOUNTER — Emergency Department (HOSPITAL_COMMUNITY): Payer: No Typology Code available for payment source

## 2013-12-30 ENCOUNTER — Emergency Department (HOSPITAL_COMMUNITY)
Admission: EM | Admit: 2013-12-30 | Discharge: 2013-12-30 | Disposition: A | Discharge status: Home / Self Care | Payer: No Typology Code available for payment source | Attending: Emergency Medicine | Admitting: Emergency Medicine

## 2013-12-30 DIAGNOSIS — R109 Unspecified abdominal pain: Secondary | ICD-10-CM | POA: Insufficient documentation

## 2013-12-30 DIAGNOSIS — R05 Cough: Secondary | ICD-10-CM | POA: Insufficient documentation

## 2013-12-30 DIAGNOSIS — R071 Chest pain on breathing: Secondary | ICD-10-CM | POA: Insufficient documentation

## 2013-12-30 DIAGNOSIS — R0989 Other specified symptoms and signs involving the circulatory and respiratory systems: Secondary | ICD-10-CM | POA: Insufficient documentation

## 2013-12-30 DIAGNOSIS — Z982 Presence of cerebrospinal fluid drainage device: Secondary | ICD-10-CM | POA: Insufficient documentation

## 2013-12-30 DIAGNOSIS — Z79899 Other long term (current) drug therapy: Secondary | ICD-10-CM | POA: Insufficient documentation

## 2013-12-30 DIAGNOSIS — M549 Dorsalgia, unspecified: Secondary | ICD-10-CM | POA: Insufficient documentation

## 2013-12-30 DIAGNOSIS — R0781 Pleurodynia: Secondary | ICD-10-CM

## 2013-12-30 DIAGNOSIS — Z3202 Encounter for pregnancy test, result negative: Secondary | ICD-10-CM | POA: Insufficient documentation

## 2013-12-30 DIAGNOSIS — R059 Cough, unspecified: Secondary | ICD-10-CM | POA: Insufficient documentation

## 2013-12-30 LAB — CBC WITH DIFFERENTIAL/PLATELET
Basophils Absolute: 0 10*3/uL (ref 0.0–0.1)
Basophils Relative: 0 % (ref 0–1)
Eosinophils Absolute: 0.3 10*3/uL (ref 0.0–0.7)
Eosinophils Relative: 3 % (ref 0–5)
HCT: 39.1 % (ref 36.0–46.0)
HEMOGLOBIN: 13.7 g/dL (ref 12.0–15.0)
LYMPHS ABS: 1.7 10*3/uL (ref 0.7–4.0)
Lymphocytes Relative: 18 % (ref 12–46)
MCH: 29.7 pg (ref 26.0–34.0)
MCHC: 35 g/dL (ref 30.0–36.0)
MCV: 84.8 fL (ref 78.0–100.0)
MONOS PCT: 11 % (ref 3–12)
Monocytes Absolute: 1 10*3/uL (ref 0.1–1.0)
NEUTROS PCT: 68 % (ref 43–77)
Neutro Abs: 6.3 10*3/uL (ref 1.7–7.7)
Platelets: 289 10*3/uL (ref 150–400)
RBC: 4.61 MIL/uL (ref 3.87–5.11)
RDW: 13 % (ref 11.5–15.5)
WBC: 9.3 10*3/uL (ref 4.0–10.5)

## 2013-12-30 LAB — COMPREHENSIVE METABOLIC PANEL
ALK PHOS: 74 U/L (ref 39–117)
ALT: 10 U/L (ref 0–35)
AST: 13 U/L (ref 0–37)
Albumin: 3.3 g/dL — ABNORMAL LOW (ref 3.5–5.2)
BUN: 10 mg/dL (ref 6–23)
CO2: 24 meq/L (ref 19–32)
Calcium: 9.1 mg/dL (ref 8.4–10.5)
Chloride: 103 mEq/L (ref 96–112)
Creatinine, Ser: 0.64 mg/dL (ref 0.50–1.10)
GLUCOSE: 96 mg/dL (ref 70–99)
POTASSIUM: 3.2 meq/L — AB (ref 3.7–5.3)
SODIUM: 139 meq/L (ref 137–147)
Total Bilirubin: 0.8 mg/dL (ref 0.3–1.2)
Total Protein: 7.2 g/dL (ref 6.0–8.3)

## 2013-12-30 LAB — URINALYSIS, ROUTINE W REFLEX MICROSCOPIC
Bilirubin Urine: NEGATIVE
Glucose, UA: NEGATIVE mg/dL
Ketones, ur: NEGATIVE mg/dL
Nitrite: NEGATIVE
Protein, ur: NEGATIVE mg/dL
Specific Gravity, Urine: 1.013 (ref 1.005–1.030)
Urobilinogen, UA: 1 mg/dL (ref 0.0–1.0)
pH: 6.5 (ref 5.0–8.0)

## 2013-12-30 LAB — URINE MICROSCOPIC-ADD ON

## 2013-12-30 LAB — I-STAT TROPONIN, ED: Troponin i, poc: 0 ng/mL (ref 0.00–0.08)

## 2013-12-30 LAB — PRO B NATRIURETIC PEPTIDE: Pro B Natriuretic peptide (BNP): 44.8 pg/mL (ref 0–125)

## 2013-12-30 LAB — PREGNANCY, URINE: Preg Test, Ur: NEGATIVE

## 2013-12-30 LAB — LIPASE, BLOOD: Lipase: 16 U/L (ref 11–59)

## 2013-12-30 MED ORDER — OXYCODONE-ACETAMINOPHEN 5-325 MG PO TABS: 2.0000 | ORAL_TABLET | ORAL | Status: DC | PRN

## 2013-12-30 MED ORDER — HYDROMORPHONE HCL PF 1 MG/ML IJ SOLN
1.0000 mg | Freq: Once | INTRAMUSCULAR | Status: AC
  Administered 2013-12-30: 1 mg via INTRAVENOUS
  Filled 2013-12-30: qty 1

## 2013-12-30 MED ORDER — HYDROMORPHONE HCL 2 MG PO TABS: 2.0000 mg | ORAL_TABLET | Freq: Four times a day (QID) | ORAL | Status: DC | PRN

## 2013-12-30 MED ORDER — ONDANSETRON HCL 4 MG/2ML IJ SOLN
4.0000 mg | Freq: Once | INTRAMUSCULAR | Status: AC
  Administered 2013-12-30: 4 mg via INTRAVENOUS
  Filled 2013-12-30: qty 2

## 2013-12-30 MED ORDER — OXYCODONE-ACETAMINOPHEN 5-325 MG PO TABS: 1.0000 | ORAL_TABLET | Freq: Four times a day (QID) | ORAL | Status: DC | PRN

## 2013-12-30 MED ORDER — SODIUM CHLORIDE 0.9 % IV SOLN
INTRAVENOUS | Status: DC
  Administered 2013-12-30: 14:00:00 via INTRAVENOUS

## 2013-12-30 NOTE — ED Notes (Signed)
MD at bedside. 

## 2013-12-30 NOTE — Addendum Note (Signed)
Addendum created 12/30/13 [id number] by Achille RichAdam Hermenegildo Clausen, MD   Modules edited: Anesthesia Responsible Staff

## 2013-12-30 NOTE — ED Provider Notes (Signed)
CSN: [id number]     Arrival date & time 12/30/13  1215 History   First MD Initiated Contact with Patient 12/30/13 1258     Chief Complaint  Patient presents with  . Shortness of Breath  . Abdominal Pain     (Consider location/radiation/quality/duration/timing/severity/associated sxs/prior Treatment) Patient is a 38 y.o. female presenting with shortness of breath and abdominal pain. The history is provided by the patient.  Shortness of Breath Associated symptoms: abdominal pain, chest pain and cough   Associated symptoms: no fever, no headaches and no rash   Abdominal Pain Associated symptoms: chest pain, cough and shortness of breath   Associated symptoms: no dysuria and no fever    patient status post adjustment of her VP shunt in February by Dr. Danielle Dess from neurosurgery. Patient seen on March 24 for right sided the chest pain. Patient CT angios that time that was negative for PE. It showed a pleural effusion which is to be expected with the patient having a ventricular pleural shunt. Patient does not understand why she's developed pain. Patient was discharged home with Percocet 5 mg which is not helping the pain. Patient was also at the neurosurgery office this morning. Patient states that the pain is 10 out of 10. It's on the right lower part of the anterior chest radiates to the back. It's associated with a cough and shortness of breath. Patient denies fevers. Patient denies nausea vomiting or diarrhea.  Past Medical History  Diagnosis Date  . VP (ventriculoperitoneal) shunt status    Past Surgical History  Procedure Laterality Date  . Shunt      brain  . Shunt revision ventricular-peritoneal N/A 12/03/2013    Procedure: Revision of Ventricular Peritoneal Shunt;  Surgeon: Cristi Loron, MD;  Location: [facility];  Service: Neurosurgery;  Laterality: N/A;   Family History  Problem Relation Age of Onset  . Diabetes Father    History  Substance Use Topics  . Smoking status:  Never Smoker   . Smokeless tobacco: Never Used  . Alcohol Use: No   OB History   Grav Para Term Preterm Abortions TAB SAB Ect Mult Living                 Review of Systems  Constitutional: Negative for fever.  HENT: Positive for congestion.   Eyes: Negative for visual disturbance.  Respiratory: Positive for cough and shortness of breath.   Cardiovascular: Positive for chest pain.  Gastrointestinal: Positive for abdominal pain.  Genitourinary: Negative for dysuria.  Musculoskeletal: Positive for back pain.  Skin: Negative for rash.  Neurological: Negative for headaches.  Hematological: Does not bruise/bleed easily.  Psychiatric/Behavioral: Negative for confusion.      Allergies  Review of patient's allergies indicates no known allergies.  Home Medications   Current Outpatient Rx  Name  Route  Sig  Dispense  Refill  . HYDROmorphone (DILAUDID) 2 MG tablet   Oral   Take 1 tablet (2 mg total) by mouth every 6 (six) hours as needed for severe pain.   30 tablet   0   . levonorgestrel (MIRENA) 20 MCG/24HR IUD   Intrauterine   1 each by Intrauterine route once.         . Multiple Vitamin (MULTIVITAMIN WITH MINERALS) TABS tablet   Oral   Take 1 tablet by mouth daily.         . naproxen sodium (ANAPROX) 220 MG tablet   Oral   Take 220 mg by mouth daily  as needed (for pain).         Marland Kitchen oxyCODONE-acetaminophen (PERCOCET/ROXICET) 5-325 MG per tablet   Oral   Take 1 tablet by mouth every 6 (six) hours as needed for severe pain.   11 tablet   0   . oxyCODONE-acetaminophen (PERCOCET/ROXICET) 5-325 MG per tablet   Oral   Take 1-2 tablets by mouth every 6 (six) hours as needed for severe pain.   20 tablet   0    BP 116/77  Pulse 96  Temp(Src) 98 F (36.7 C) (Oral)  Resp 20  Wt 151 lb 5 oz (68.635 kg)  SpO2 100%  LMP 12/01/2013 Physical Exam  Nursing note and vitals reviewed. Constitutional: She is oriented to person, place, and time. She appears  well-developed and well-nourished. No distress.  HENT:  Head: Normocephalic and atraumatic.  Mouth/Throat: Oropharynx is clear and moist.  Eyes: Conjunctivae and EOM are normal. Pupils are equal, round, and reactive to light.  Neck: Normal range of motion.  Cardiovascular: Normal rate, regular rhythm, normal heart sounds and intact distal pulses.   Pulmonary/Chest: Effort normal and breath sounds normal.  Abdominal: Soft. Bowel sounds are normal. There is no tenderness.  Musculoskeletal: Normal range of motion. She exhibits no edema.  Neurological: She is alert and oriented to person, place, and time. No cranial nerve deficit. She exhibits normal muscle tone. Coordination normal.  Skin: Skin is warm. No rash noted.    ED Course  Procedures (including critical care time) Labs Review Labs Reviewed  COMPREHENSIVE METABOLIC PANEL - Abnormal; Notable for the following:    Potassium 3.2 (*)    Albumin 3.3 (*)    All other components within normal limits  URINALYSIS, ROUTINE W REFLEX MICROSCOPIC - Abnormal; Notable for the following:    Hgb urine dipstick MODERATE (*)    Leukocytes, UA TRACE (*)    All other components within normal limits  URINE MICROSCOPIC-ADD ON - Abnormal; Notable for the following:    Squamous Epithelial / LPF FEW (*)    Bacteria, UA FEW (*)    All other components within normal limits  CBC WITH DIFFERENTIAL  PREGNANCY, URINE  LIPASE, BLOOD  PRO B NATRIURETIC PEPTIDE  I-STAT TROPOININ, ED   Results for orders placed during the hospital encounter of 12/30/13  CBC WITH DIFFERENTIAL      Result Value Ref Range   WBC 9.3  4.0 - 10.5 K/uL   RBC 4.61  3.87 - 5.11 MIL/uL   Hemoglobin 13.7  12.0 - 15.0 g/dL   HCT 16.1  09.6 - 04.5 %   MCV 84.8  78.0 - 100.0 fL   MCH 29.7  26.0 - 34.0 pg   MCHC 35.0  30.0 - 36.0 g/dL   RDW 40.9  81.1 - 91.4 %   Platelets 289  150 - 400 K/uL   Neutrophils Relative % 68  43 - 77 %   Neutro Abs 6.3  1.7 - 7.7 K/uL   Lymphocytes  Relative 18  12 - 46 %   Lymphs Abs 1.7  0.7 - 4.0 K/uL   Monocytes Relative 11  3 - 12 %   Monocytes Absolute 1.0  0.1 - 1.0 K/uL   Eosinophils Relative 3  0 - 5 %   Eosinophils Absolute 0.3  0.0 - 0.7 K/uL   Basophils Relative 0  0 - 1 %   Basophils Absolute 0.0  0.0 - 0.1 K/uL  COMPREHENSIVE METABOLIC PANEL      Result  Value Ref Range   Sodium 139  137 - 147 mEq/L   Potassium 3.2 (*) 3.7 - 5.3 mEq/L   Chloride 103  96 - 112 mEq/L   CO2 24  19 - 32 mEq/L   Glucose, Bld 96  70 - 99 mg/dL   BUN 10  6 - 23 mg/dL   Creatinine, Ser 1.61  0.50 - 1.10 mg/dL   Calcium 9.1  8.4 - 09.6 mg/dL   Total Protein 7.2  6.0 - 8.3 g/dL   Albumin 3.3 (*) 3.5 - 5.2 g/dL   AST 13  0 - 37 U/L   ALT 10  0 - 35 U/L   Alkaline Phosphatase 74  39 - 117 U/L   Total Bilirubin 0.8  0.3 - 1.2 mg/dL   GFR calc non Af Amer >90  >90 mL/min   GFR calc Af Amer >90  >90 mL/min  PREGNANCY, URINE      Result Value Ref Range   Preg Test, Ur NEGATIVE  NEGATIVE  URINALYSIS, ROUTINE W REFLEX MICROSCOPIC      Result Value Ref Range   Color, Urine YELLOW  YELLOW   APPearance CLEAR  CLEAR   Specific Gravity, Urine 1.013  1.005 - 1.030   pH 6.5  5.0 - 8.0   Glucose, UA NEGATIVE  NEGATIVE mg/dL   Hgb urine dipstick MODERATE (*) NEGATIVE   Bilirubin Urine NEGATIVE  NEGATIVE   Ketones, ur NEGATIVE  NEGATIVE mg/dL   Protein, ur NEGATIVE  NEGATIVE mg/dL   Urobilinogen, UA 1.0  0.0 - 1.0 mg/dL   Nitrite NEGATIVE  NEGATIVE   Leukocytes, UA TRACE (*) NEGATIVE  LIPASE, BLOOD      Result Value Ref Range   Lipase 16  11 - 59 U/L  PRO B NATRIURETIC PEPTIDE      Result Value Ref Range   Pro B Natriuretic peptide (BNP) 44.8  0 - 125 pg/mL  URINE MICROSCOPIC-ADD ON      Result Value Ref Range   Squamous Epithelial / LPF FEW (*) RARE   WBC, UA 3-6  <3 WBC/hpf   RBC / HPF 3-6  <3 RBC/hpf   Bacteria, UA FEW (*) RARE  I-STAT TROPOININ, ED      Result Value Ref Range   Troponin i, poc 0.00  0.00 - 0.08 ng/mL   Comment 3              Imaging Review Ct Abdomen Pelvis Wo Contrast  12/30/2013   CLINICAL DATA:  Shortness of breath and right upper quadrant abdominal pain. Ventriculoperitoneal shunt revision February 2015. Cough.  EXAM: CT CHEST, ABDOMEN AND PELVIS WITHOUT CONTRAST  TECHNIQUE: Multidetector CT imaging of the chest, abdomen and pelvis was performed following the standard protocol without IV contrast.  COMPARISON:  DG CHEST 2 VIEW dated 12/30/2013; CT ANGIO CHEST W/CM &/OR WO/CM dated 12/29/2013  FINDINGS: CT CHEST FINDINGS  Interval slight increase in now small to moderate right pleural effusion with associated compressive atelectasis. Right-sided ventriculoperitoneal shunt is in place with tip terminating at the right lung base posteriorly image 38 at the level of the hemidiaphragms. Heart size is normal. No lymphadenopathy. Triangular soft tissue density in the anterior mediastinum most likely represents residual thymus. Central airways are patent.  CT ABDOMEN AND PELVIS FINDINGS  5 mm epicardial lymph node incidentally reidentified. 6 mm too small to characterize lateral segment left hepatic lobe hypodense lesion is identified image 41. 4 mm too small to characterize anterior segment right  hepatic lobe hypodense lesion image 47 is noted. Unenhanced gallbladder, kidneys, adrenal glands, spleen, and pancreas are normal. No free air or fluid.  Normal-appearing appendix. No bowel wall thickening or focal segmental dilatation. No lymphadenopathy. Uterus and ovaries are normal in appearance with IUD in place. No acute osseous abnormality.  IMPRESSION: Slight interval increase in small to moderate right pleural effusion with associated compressive atelectasis since yesterday's exam.  No new acute intra-abdominal or pelvic pathology.   Electronically Signed   By: Christiana Pellant M.D.   On: 12/30/2013 14:41   Dg Chest 2 View  12/30/2013   CLINICAL DATA:  Shortness of breath and cough  EXAM: CHEST  2 VIEW  COMPARISON:   12/29/2013  FINDINGS: Cardiac shadow is stable. The left lung is clear. A moderate right-sided pleural effusion is noted with underlying right basilar atelectasis. This is worsened in the interval from the prior exam. A ventricular to pleural shunt is again identified and stable. The upper abdomen is within normal limits.  IMPRESSION: Increasing right-sided pleural effusion with underlying basilar atelectasis.   Electronically Signed   By: Alcide Clever M.D.   On: 12/30/2013 14:00   Dg Chest 2 View  12/29/2013   CLINICAL DATA:  Right upper quadrant abdominal pain.  Cough.  EXAM: CHEST  2 VIEW  COMPARISON:  Chest x-ray 12/02/2013.  FINDINGS: Lung volumes are normal. No consolidative airspace disease. No pleural effusions. No pneumothorax. No pulmonary nodule or mass noted. Pulmonary vasculature and the cardiomediastinal silhouette are within normal limits. Right-sided ventriculopleural shunt again noted.  IMPRESSION: No radiographic evidence of acute cardiopulmonary disease.   Electronically Signed   By: Trudie Reed M.D.   On: 12/29/2013 03:27   Ct Chest Wo Contrast  12/30/2013   CLINICAL DATA:  Shortness of breath and right upper quadrant abdominal pain. Ventriculoperitoneal shunt revision February 2015. Cough.  EXAM: CT CHEST, ABDOMEN AND PELVIS WITHOUT CONTRAST  TECHNIQUE: Multidetector CT imaging of the chest, abdomen and pelvis was performed following the standard protocol without IV contrast.  COMPARISON:  DG CHEST 2 VIEW dated 12/30/2013; CT ANGIO CHEST W/CM &/OR WO/CM dated 12/29/2013  FINDINGS: CT CHEST FINDINGS  Interval slight increase in now small to moderate right pleural effusion with associated compressive atelectasis. Right-sided ventriculoperitoneal shunt is in place with tip terminating at the right lung base posteriorly image 38 at the level of the hemidiaphragms. Heart size is normal. No lymphadenopathy. Triangular soft tissue density in the anterior mediastinum most likely represents  residual thymus. Central airways are patent.  CT ABDOMEN AND PELVIS FINDINGS  5 mm epicardial lymph node incidentally reidentified. 6 mm too small to characterize lateral segment left hepatic lobe hypodense lesion is identified image 41. 4 mm too small to characterize anterior segment right hepatic lobe hypodense lesion image 47 is noted. Unenhanced gallbladder, kidneys, adrenal glands, spleen, and pancreas are normal. No free air or fluid.  Normal-appearing appendix. No bowel wall thickening or focal segmental dilatation. No lymphadenopathy. Uterus and ovaries are normal in appearance with IUD in place. No acute osseous abnormality.  IMPRESSION: Slight interval increase in small to moderate right pleural effusion with associated compressive atelectasis since yesterday's exam.  No new acute intra-abdominal or pelvic pathology.   Electronically Signed   By: Christiana Pellant M.D.   On: 12/30/2013 14:41   Ct Angio Chest Pe W/cm &/or Wo Cm  12/29/2013   ADDENDUM REPORT: 12/29/2013 06:08  ADDENDUM: When compared to CT of the abdomen and pelvis December 25, 2013,  the right pleural effusion appears new, and, the shunt tip appears to have migrated to the posterior pleural space.  Findings discussed with and reconfirmed by Dr.KELLY HUMES on 12/29/2013 at approximately 6:05 a.m.   Electronically Signed   By: Awilda Metroourtnay  Bloomer   On: 12/29/2013 06:08   12/29/2013   CLINICAL DATA:  Elevated D-dimer, right upper quadrant pain with occasional dry cough.  EXAM: CT ANGIOGRAPHY CHEST WITH CONTRAST  TECHNIQUE: Multidetector CT imaging of the chest was performed using the standard protocol during bolus administration of intravenous contrast. Multiplanar CT image reconstructions and MIPs were obtained to evaluate the vascular anatomy.  CONTRAST:  60mL OMNIPAQUE IOHEXOL 350 MG/ML SOLN  COMPARISON:  DG CHEST 2 VIEW dated 12/29/2013  FINDINGS: Main pulmonary artery is not enlarged. No pulmonary arterial filling defects to the level of the  subsegmental branches.  Small right pleural effusion with ventriculopleural shunt in place, catheter appears intact, terminating in the posterior pleural space. No rim enhancing pleural fluid collections. Mild right lower lobe dependent atelectasis without focal consolidation. Tracheobronchial tree is patent and midline with trace bronchial wall thickening. Heart and pericardium are unremarkable. Thoracic aorta is normal course and caliber. Minimal fullness of the mediastinal soft tissues could reflect residual or reactivated thymic tissue. No lymphadenopathy by CT size criteria.  Bilateral breast nodules, largest on the right measuring 2 x 1 cm, axial 45/78. 6 mm left parasagittal anterior abdominal wall subcutaneous nodule could reflect lymph node. Included view of the abdomen is unremarkable. Mild degenerative change of the thoracic spine.  Review of the MIP images confirms the above findings.  IMPRESSION: No pulmonary embolism.  Ventriculopleural shunt terminates in the right lung base with small right pleural effusion. No CT findings of empyema. Minimal right lung base atelectasis.  Trace bronchial wall thickening could reflect bronchitis.  Bilateral breast nodules, largest measuring 2 x 1 cm on the right, consider mammogram.  Electronically Signed: By: Awilda Metroourtnay  Bloomer On: 12/29/2013 05:20     EKG Interpretation   Date/Time:  Wednesday December 30 2013 12:22:38 EDT Ventricular Rate:  96 PR Interval:  156 QRS Duration: 112 QT Interval:  342 QTC Calculation: 432 R Axis:   95 Text Interpretation:  Normal sinus rhythm Rightward axis Low voltage QRS  Cannot rule out Anterior infarct , age undetermined Abnormal ECG No  significant change since last tracing Confirmed by Kaelen Brennan  MD, Shyah Cadmus  [phone]) on 12/30/2013 1:25:53 PM      MDM   Final diagnoses:  Pleuritic chest pain    Patient seen 2 days ago for same complaint. Patient had CT angios the chest at that time which showed no evidence of  pulmonary embolism. Patient has a ventricular pleural shunt bolus have fluid in her lung. The shunt is on the right side. The question is why it suddenly has she developed a pleuritic type chest pain in that area. Today's workup without specific explanation. No evidence of infection. No leukocytosis no fevers discuss with neurosurgery again. They are not sure why she has pain but the shunt is functioning properly and the fluid in the lung a mouth is normal. Will treat with pain medication from a pleuritic type chest pain. Hopefully it will resolve over time. Patient will followup with urgent care that she uses for primary care Dr. Barbaraann FasterPatient's Percocet renewed and will be supplemented with hydromorphone. Patient's room air sats are 100%. Nontoxic no acute distress.    Shelda JakesScott W. Travelle Mcclimans, MD 12/30/13 [phone]

## 2013-12-30 NOTE — ED Notes (Signed)
Pt reports symptoms have not changed since her ED visit yesterday. Pt confirmed pt is on the right rib. Pt has tried prescribed percocet but is still unable to sleep at night due to pain and cough. Pt has not followed up with any doctors between last visit to ED and today. Pt speaking in complete sentences with no distress. Denies abdominal pain. Pt placed on cardiac monitor and continuous pulse oxymetry.

## 2013-12-30 NOTE — ED Notes (Addendum)
Pt reports she has VP shunt, c/o SOB and RUQ pain for 4 days. States VP shunt replaced in February, worried it may be backed up. Denies n/v/d. Has dry, non-productive cough and feels SOB. Also, adds blood in urine.

## 2013-12-30 NOTE — Discharge Instructions (Signed)
No signs of infection. CT scan without any new findings. The fluid you have home alone is supposed to be there. Discussed with neurosurgery. Everybody is not sure why you're having the pain but we know you are. Plan is to treat with pain medications and see if it resolves over the next several days. Return for any new or worse symptoms. Definitely return for fever worse cough or worse trouble breathing.  Continue to take your Percocet 1-2 tablets every 6 hours. [facility] 2 tablets every 6 hours. If that does not help your pain then supplemented with the hydrocodone. You can take the hydrocodone one tablet every 4 hours.

## 2014-01-01 NOTE — Telephone Encounter (Signed)
I have not heard back from patient, have you had any correspondence with her?

## 2014-01-01 NOTE — Telephone Encounter (Signed)
Call the patient again and tell her I know she was in the emergency room the 24th and the 25th. It is important that she see a neurosurgeon to get their opinion about the problems she is having .

## 2014-01-07 NOTE — Telephone Encounter (Signed)
Called her to see if she has seen GI / and Neurosurgeon. Her husband states she has seen Dr Lovell SheehanJenkins. He states she is feeling better now , she has been given pain meds and an antibiotic for her lungs. He states she will follow up with him again soon. To you FYI

## 2014-01-14 NOTE — Consult Note (Signed)
Dr. Lovell SheehanJenkins saw patient subsequently, admitted her, and performed shunt revision surgery.

## 2014-05-11 ENCOUNTER — Ambulatory Visit (INDEPENDENT_AMBULATORY_CARE_PROVIDER_SITE_OTHER): Payer: Self-pay

## 2014-05-11 ENCOUNTER — Ambulatory Visit (INDEPENDENT_AMBULATORY_CARE_PROVIDER_SITE_OTHER): Payer: Self-pay | Admitting: Family Medicine

## 2014-05-11 VITALS — BP 118/78 | HR 84 | Temp 98.7°F | Resp 16 | Ht 63.0 in | Wt 148.8 lb

## 2014-05-11 DIAGNOSIS — J90 Pleural effusion, not elsewhere classified: Secondary | ICD-10-CM

## 2014-05-11 DIAGNOSIS — R059 Cough, unspecified: Secondary | ICD-10-CM

## 2014-05-11 DIAGNOSIS — Z982 Presence of cerebrospinal fluid drainage device: Secondary | ICD-10-CM

## 2014-05-11 DIAGNOSIS — R51 Headache: Secondary | ICD-10-CM

## 2014-05-11 DIAGNOSIS — R05 Cough: Secondary | ICD-10-CM

## 2014-05-11 LAB — POCT CBC
GRANULOCYTE PERCENT: 71.7 % (ref 37–80)
HCT, POC: 38.9 % (ref 37.7–47.9)
Hemoglobin: 12.8 g/dL (ref 12.2–16.2)
Lymph, poc: 1.6 (ref 0.6–3.4)
MCH, POC: 27.6 pg (ref 27–31.2)
MCHC: 32.9 g/dL (ref 31.8–35.4)
MCV: 83.9 fL (ref 80–97)
MID (CBC): 0.7 (ref 0–0.9)
MPV: 8.2 fL (ref 0–99.8)
PLATELET COUNT, POC: 338 10*3/uL (ref 142–424)
POC Granulocyte: 5.9 (ref 2–6.9)
POC LYMPH %: 19.3 % (ref 10–50)
POC MID %: 9 %M (ref 0–12)
RBC: 4.64 M/uL (ref 4.04–5.48)
RDW, POC: 13.9 %
WBC: 8.2 10*3/uL (ref 4.6–10.2)

## 2014-05-11 MED ORDER — BENZONATATE 100 MG PO CAPS: 100.0000 mg | ORAL_CAPSULE | Freq: Three times a day (TID) | ORAL | Status: DC | PRN

## 2014-05-11 MED ORDER — CEFDINIR 300 MG PO CAPS: 600.0000 mg | ORAL_CAPSULE | Freq: Every day | ORAL | Status: DC

## 2014-05-11 MED ORDER — PROMETHAZINE-DM 6.25-15 MG/5ML PO SYRP: 5.0000 mL | ORAL_SOLUTION | Freq: Four times a day (QID) | ORAL | Status: DC | PRN

## 2014-05-11 NOTE — Patient Instructions (Signed)
1 I will contact you when I receive a return call from neurosurgery.

## 2014-05-11 NOTE — Progress Notes (Addendum)
Subjective:    Patient ID: Lisa LimerickOSANNA Johns, female    DOB: 01/05/1976, 38 y.o.   MRN: [id number] This chart was scribed for Ethelda ChickKristi M Smith, MD by Julian HyMorgan Graham, ED Scribe. The patient was seen in Room 12. The patient's care was started at 3:52 PM.   05/11/2014  Bronchitis   HPI HPI Comments: [name] is a 38 y.o. female with PMH significant for VP shunt who presents to the [facility] complaining of productive cough onset three weeks ago. Pt reports associated headache, vomiting, fever and chills. Pt reports she vomited on 8/2 and has not had recurrent vomiting.  Pt also reports a previous fever of 101 degrees with onset of illness three weeks ago but denies any current fever.  Pt reports [place] [facility] [place] and recent headaches and the doctor noticed fluid in her right lung on physical exam; she was undergoing a second opinion regarding her VP shunt; she notes that her usual NS care is provided by Dr. Lovell SheehanJenkins and Dr. Danielle [place]lsner in [place]. Pt has a shunt in her head for "low blood flow". Pt reports on Saturday/three days ago she developed a severe headache but this has improved. Pt denies sick contacts. Pt denies sweats, sore throat, SOB, rhinnorhea, diarrhea or congestion.  Denies photophobia, severe headache now, confusion, neck stiffness or pain; denies dizziness/paresthesias/focal weakness.  Denies tick bite in past month.  PCP is [facility].   Pt denies any other medical issues. Pt reports both of her parents are living. Non-smoker. Pt denies travelling outside of the country.  Upon review of chart, underwent VP shunt revision in 11/2013 by Dr. Lovell SheehanJenkins.  In 12/2013, presented to ED with R sided chest pain, abdominal pain, SOB.  +New onset R pleural effusion small to moderate in size noted on CXR.  S/p CT chest/abdomen/pelvis at that time.  CT angio negative for PE at that time.  NS was consulted by ED physician at  this evaluation.   [facility] office contacted at 4:50pm; providers have left for the day.  Pt was evaluated by PA yesterday in office.   Review of Systems  Constitutional: Positive for chills. Negative for fever (previous), diaphoresis and fatigue.  HENT: Negative for congestion, ear pain, postnasal drip, rhinorrhea, sinus pressure, sneezing, sore throat, trouble swallowing and voice change.   Respiratory: Positive for cough (productive). Negative for shortness of breath, wheezing and stridor.   Cardiovascular: Positive for chest pain. Negative for palpitations and leg swelling.  Gastrointestinal: Positive for nausea and vomiting (one episode). Negative for diarrhea.  Skin: Negative for rash.  Neurological: Positive for headaches. Negative for dizziness, tremors, seizures, syncope, facial asymmetry, speech difficulty, weakness, light-headedness and numbness.    Past Medical History  Diagnosis Date   VP (ventriculoperitoneal) shunt status    Chronic headaches    Chronic cough    Pleural effusion on right     chronic due to VP shunt   Ventriculopleural shunt status    Past Surgical History  Procedure Laterality Date   Shunt      brain   Shunt revision ventricular-peritoneal N/A 12/03/2013    Procedure: Revision of Ventricular Peritoneal Shunt;  Surgeon: Cristi LoronJeffrey D Jenkins, MD;  Location: [facility];  Service: Neurosurgery;  Laterality: N/A;   Shunt removal N/A 05/20/2014    Procedure: shunt removal and placement of laproscopic VP shunt with Dr Abbey Chattersosenbower;  Surgeon: Tressie StalkerJeffrey Jenkins, MD;  [facility];  Service: Neurosurgery;  Laterality: N/A;   Ventriculoperitoneal shunt Right 05/20/2014    Procedure: SHUNT INSERTION VENTRICULAR-PERITONEAL;  Surgeon: Tressie Stalker, MD;  Location: [facility]UR[facility];  Service: Neurosurgery;  Laterality: Right;   Laparoscopic revision ventricular-peritoneal (v-p) shunt N/A 05/20/2014    Procedure: LAPAROSCOPIC REVISION  VENTRICULAR-PERITONEAL (V-P) SHUNT;  Surgeon: Adolph Pollack, MD;  Location: [facility];  Service: General;  Laterality: N/A;   No Known Allergies Current Outpatient Prescriptions  Medication Sig Dispense Refill   acetaminophen (TYLENOL) 325 MG tablet Take 2 tablets (650 mg total) by mouth every 4 (four) hours as needed for mild pain (temp > 100.5).       vancomycin 1,250 mg in sodium chloride 0.9 % 250 mL Inject 1,250 mg into the vein every 8 (eight) hours. 1 WEEK FROM 05/22/14  21 Syringe  0   No current facility-administered medications for this visit.   History   Social History   Marital Status: Married    Spouse Name: N/A    Number of Children: N/A   Years of Education: N/A   Occupational History   [employer] manager   Social History Main Topics   Smoking status: Never Smoker    Smokeless tobacco: Never Used   Alcohol Use: No   Drug Use: No   Sexual Activity: Yes    Copy: IUD   Other Topics Concern   Not on file   Social History Narrative   No narrative on file       Objective:    Triage Vitals: BP 118/78   Pulse 84   Temp(Src) 98.7 F (37.1 C) (Oral)   Resp 16   Ht 5\' 3"  (1.6 m)   Wt 148 lb 12.8 oz (67.495 kg)   BMI 26.37 kg/m2   SpO2 97%   LMP 04/27/2014 Physical Exam  Nursing note and vitals reviewed. Constitutional: She is oriented to person, place, and time. She appears well-developed and well-nourished. No distress.  HENT:  Head: Normocephalic and atraumatic.  Right Ear: External ear normal.  Left Ear: External ear normal.  Nose: Nose normal.  Mouth/Throat: Oropharynx is clear and moist. No oropharyngeal exudate.  Eyes: Conjunctivae and EOM are normal. Pupils are equal, round, and reactive to light.  Neck: Normal range of motion. Neck supple. No tracheal deviation present. No thyromegaly present.  No nuchal rigidity.  Cardiovascular: Normal rate, regular rhythm and normal heart sounds.  Exam reveals no  gallop and no friction rub.   No murmur heard. Pulmonary/Chest: Effort normal. No accessory muscle usage. Not tachypneic. No respiratory distress. She has decreased breath sounds in the right upper field, the right middle field and the right lower field. She has no wheezes. She has no rhonchi. She has no rales.  Decreased breath sounds, rihgt middle and lower.  Abdominal: Soft. Bowel sounds are normal. She exhibits no distension and no mass. There is no tenderness. There is no rebound and no guarding.  Musculoskeletal: Normal range of motion.  Lymphadenopathy:    She has no cervical adenopathy.  Neurological: She is alert and oriented to person, place, and time. She has normal strength. She is not disoriented. No cranial nerve deficit or sensory deficit. She exhibits normal muscle tone. Coordination and gait normal.  Skin: Skin is warm and dry. No rash noted. She is not diaphoretic.  Psychiatric: She has a normal mood and affect. Her behavior is normal.   Results for orders placed in visit on 05/11/14  POCT CBC      Result Value Ref Range   WBC 8.2  4.6 - 10.2 K/uL   Lymph, poc 1.6  0.6 - 3.4   POC LYMPH PERCENT 19.3  10 - 50 %L   MID (cbc) 0.7  0 - 0.9   POC MID % 9.0  0 - 12 %M   POC Granulocyte 5.9  2 - 6.9   Granulocyte percent 71.7  37 - 80 %G   RBC 4.64  4.04 - 5.48 M/uL   Hemoglobin 12.8  12.2 - 16.2 g/dL   HCT, POC 11.9  14.7 - 47.9 %   MCV 83.9  80 - 97 fL   MCH, POC 27.6  27 - 31.2 pg   MCHC 32.9  31.8 - 35.4 g/dL   RDW, POC 82.9     Platelet Count, POC 338  142 - 424 K/uL   MPV 8.2  0 - 99.8 fL   [facility] reading (PRIMARY) by  Dr. Katrinka Blazing.  CXR: large RML, RLL infiltrate; +VP shunt present and coiled in R lung.      Assessment & Plan:   1. Cough   2. Pleural effusion, right   3. VP (ventriculoperitoneal) shunt status   4. Headache(784.0)    1.  Cough:  New onset in past three weeks.  Associated with moderate pleural effusion.  Rx for Omnicef prescribed; also provided  with Tessalon perles and Promethazine DM cough syrup.  Spoke with Dr. Danielle Dess of NS who is very familiar with patient; advised to treat patient per standard protocol for bronchitis and/or pneumonia; chronic ventriculoPLEURAL shunt; thus, will commonly have a pleural effusion.  With normal WBC count and no current fever, Dr. Danielle Dess has low suspicion of infected pleural effusion.  Advised patient to present immediately for acute worsening cough, development of fever, SOB.  Advised to present to ED for worsening headache or for recurrent vomiting. 2. Pleural effusion R Moderate: persistent; secondary to VentriculoPLEURAL shunt; spoke with Dr. Danielle Dess who is very familiar with patient.  Per Elsner, pt will have ongoing chronic pleural effusion due to shunt that drains into pleural space.  Reassured by normal WBC count and pulse oximetry and lack of fever.  No evidence of respiratory distress at this time; advised to return immediately for SOB, recurrent fever. 3.  Headache:  New over the weekend; advised patient to follow-up with Dr. Lovell Sheehan and/or Danielle Dess in upcoming week for recurrent headaches. No severe headache currently; neurologically intact during visit.  To ED for recurrent headache with vomiting. 4.  VentricoPLEURAL shunt:s/p hydrocephalus s/p revision by Lovell Sheehan in 11/2013.  Recommend follow-up with NS in upcoming 1-2 weeks.  Advised pt to present to ED for severe headache or recurrent vomiting.    Meds ordered this encounter  Medications   DISCONTD: cefdinir (OMNICEF) 300 MG capsule    Sig: Take 2 capsules (600 mg total) by mouth daily.    Dispense:  20 capsule    Refill:  0   DISCONTD: benzonatate (TESSALON) 100 MG capsule    Sig: Take 1-2 capsules (100-200 mg total) by mouth 3 (three) times daily as needed for cough.    Dispense:  40 capsule    Refill:  0   DISCONTD: promethazine-dextromethorphan (PROMETHAZINE-DM) 6.25-15 MG/5ML syrup    Sig: Take 5 mLs by mouth 4 (four) times daily as  needed for cough.    Dispense:  180 mL    Refill:  0    No Follow-up on file.  I personally performed the services described in this documentation, which was scribed in my presence.  The recorded information has been reviewed and is accurate.  Nilda Simmer, M.D.  [facility] [address] [place], [place]  [address] [phone] phone [phone] fax

## 2014-05-12 ENCOUNTER — Emergency Department (HOSPITAL_COMMUNITY): Payer: Medicaid Other

## 2014-05-12 ENCOUNTER — Inpatient Hospital Stay (HOSPITAL_COMMUNITY): Payer: Medicaid Other

## 2014-05-12 ENCOUNTER — Inpatient Hospital Stay (HOSPITAL_COMMUNITY)
Admission: EM | Admit: 2014-05-12 | Discharge: 2014-05-22 | DRG: 032 | Disposition: A | Discharge status: Home Health | Payer: Medicaid Other | Attending: Internal Medicine | Admitting: Internal Medicine

## 2014-05-12 ENCOUNTER — Encounter (HOSPITAL_COMMUNITY): Payer: Self-pay | Admitting: Emergency Medicine

## 2014-05-12 DIAGNOSIS — T85695A Other mechanical complication of other nervous system device, implant or graft, initial encounter: Secondary | ICD-10-CM | POA: Diagnosis present

## 2014-05-12 DIAGNOSIS — R51 Headache: Secondary | ICD-10-CM | POA: Diagnosis present

## 2014-05-12 DIAGNOSIS — Z982 Presence of cerebrospinal fluid drainage device: Secondary | ICD-10-CM

## 2014-05-12 DIAGNOSIS — T8509XS Other mechanical complication of ventricular intracranial (communicating) shunt, sequela: Secondary | ICD-10-CM

## 2014-05-12 DIAGNOSIS — I959 Hypotension, unspecified: Secondary | ICD-10-CM | POA: Diagnosis present

## 2014-05-12 DIAGNOSIS — R0602 Shortness of breath: Secondary | ICD-10-CM | POA: Diagnosis not present

## 2014-05-12 DIAGNOSIS — J90 Pleural effusion, not elsewhere classified: Secondary | ICD-10-CM | POA: Diagnosis present

## 2014-05-12 DIAGNOSIS — Z833 Family history of diabetes mellitus: Secondary | ICD-10-CM | POA: Diagnosis not present

## 2014-05-12 DIAGNOSIS — Y831 Surgical operation with implant of artificial internal device as the cause of abnormal reaction of the patient, or of later complication, without mention of misadventure at the time of the procedure: Secondary | ICD-10-CM | POA: Diagnosis present

## 2014-05-12 DIAGNOSIS — G911 Obstructive hydrocephalus: Secondary | ICD-10-CM | POA: Diagnosis present

## 2014-05-12 DIAGNOSIS — G44019 Episodic cluster headache, not intractable: Secondary | ICD-10-CM

## 2014-05-12 DIAGNOSIS — T85730S Infection and inflammatory reaction due to ventricular intracranial (communicating) shunt, sequela: Secondary | ICD-10-CM

## 2014-05-12 DIAGNOSIS — T8509XA Other mechanical complication of ventricular intracranial (communicating) shunt, initial encounter: Secondary | ICD-10-CM | POA: Diagnosis present

## 2014-05-12 DIAGNOSIS — Z113 Encounter for screening for infections with a predominantly sexual mode of transmission: Secondary | ICD-10-CM

## 2014-05-12 DIAGNOSIS — R509 Fever, unspecified: Secondary | ICD-10-CM

## 2014-05-12 DIAGNOSIS — T8509XD Other mechanical complication of ventricular intracranial (communicating) shunt, subsequent encounter: Secondary | ICD-10-CM

## 2014-05-12 DIAGNOSIS — R519 Headache, unspecified: Secondary | ICD-10-CM

## 2014-05-12 HISTORY — DX: Cough: R05

## 2014-05-12 HISTORY — DX: Presence of cerebrospinal fluid drainage device: Z98.2

## 2014-05-12 HISTORY — DX: Chronic cough: R05.3

## 2014-05-12 HISTORY — DX: Pleural effusion, not elsewhere classified: J90

## 2014-05-12 LAB — CBC WITH DIFFERENTIAL/PLATELET
Basophils Absolute: 0.1 10*3/uL (ref 0.0–0.1)
Basophils Relative: 1 % (ref 0–1)
Eosinophils Absolute: 0.2 10*3/uL (ref 0.0–0.7)
Eosinophils Relative: 3 % (ref 0–5)
HCT: 37 % (ref 36.0–46.0)
Hemoglobin: 11.9 g/dL — ABNORMAL LOW (ref 12.0–15.0)
LYMPHS ABS: 1.5 10*3/uL (ref 0.7–4.0)
LYMPHS PCT: 21 % (ref 12–46)
MCH: 27.4 pg (ref 26.0–34.0)
MCHC: 32.2 g/dL (ref 30.0–36.0)
MCV: 85.3 fL (ref 78.0–100.0)
MONOS PCT: 9 % (ref 3–12)
Monocytes Absolute: 0.6 10*3/uL (ref 0.1–1.0)
NEUTROS ABS: 4.6 10*3/uL (ref 1.7–7.7)
NEUTROS PCT: 66 % (ref 43–77)
PLATELETS: 323 10*3/uL (ref 150–400)
RBC: 4.34 MIL/uL (ref 3.87–5.11)
RDW: 13.3 % (ref 11.5–15.5)
WBC: 6.9 10*3/uL (ref 4.0–10.5)

## 2014-05-12 LAB — BASIC METABOLIC PANEL
Anion gap: 11 (ref 5–15)
BUN: 12 mg/dL (ref 6–23)
CHLORIDE: 105 meq/L (ref 96–112)
CO2: 26 meq/L (ref 19–32)
Calcium: 8.5 mg/dL (ref 8.4–10.5)
Creatinine, Ser: 0.62 mg/dL (ref 0.50–1.10)
GFR calc Af Amer: >90 mL/min (ref >90)
GFR calc non Af Amer: >90 mL/min (ref >90)
GLUCOSE: 92 mg/dL (ref 70–99)
POTASSIUM: 3.5 meq/L — AB (ref 3.7–5.3)
SODIUM: 142 meq/L (ref 137–147)

## 2014-05-12 LAB — ALBUMIN, FLUID (OTHER): ALBUMIN FL: 2.7 g/dL

## 2014-05-12 LAB — LACTATE DEHYDROGENASE: LDH: 127 U/L (ref 94–250)

## 2014-05-12 LAB — BODY FLUID CELL COUNT WITH DIFFERENTIAL
Eos, Fluid: 22 %
Lymphs, Fluid: 43 %
Monocyte-Macrophage-Serous Fluid: 7 % — ABNORMAL LOW (ref 50–90)
NEUTROPHIL FLUID: 28 % — AB (ref 0–25)
Total Nucleated Cell Count, Fluid: 2929 cu mm — ABNORMAL HIGH (ref 0–1000)

## 2014-05-12 LAB — GLUCOSE, SEROUS FLUID: GLUCOSE FL: 83 mg/dL

## 2014-05-12 LAB — LACTIC ACID, PLASMA: LACTIC ACID, VENOUS: 0.5 mmol/L (ref 0.5–2.2)

## 2014-05-12 LAB — PROTEIN, BODY FLUID: Total protein, fluid: 4.8 g/dL

## 2014-05-12 LAB — LACTATE DEHYDROGENASE, PLEURAL OR PERITONEAL FLUID: LD, Fluid: 159 U/L — ABNORMAL HIGH (ref 3–23)

## 2014-05-12 MED ORDER — HYDROMORPHONE HCL PF 1 MG/ML IJ SOLN: 1.0000 mg | INTRAMUSCULAR | Status: AC | PRN

## 2014-05-12 MED ORDER — OXYCODONE HCL 5 MG PO TABS
5.0000 mg | ORAL_TABLET | ORAL | Status: DC | PRN
Start: 2014-05-12 — End: 2014-05-20
  Administered 2014-05-13: 5 mg via ORAL
  Filled 2014-05-12 (×2): qty 1

## 2014-05-12 MED ORDER — SENNA 8.6 MG PO TABS
1.0000 | ORAL_TABLET | Freq: Two times a day (BID) | ORAL | Status: DC
  Administered 2014-05-13 – 2014-05-19 (×11): 8.6 mg via ORAL
  Filled 2014-05-12 (×17): qty 1

## 2014-05-12 MED ORDER — GUAIFENESIN-DM 100-10 MG/5ML PO SYRP
5.0000 mL | ORAL_SOLUTION | ORAL | Status: DC | PRN
  Administered 2014-05-14: 5 mL via ORAL
  Filled 2014-05-12 (×2): qty 5

## 2014-05-12 MED ORDER — ONDANSETRON HCL 4 MG/2ML IJ SOLN: 4.0000 mg | Freq: Three times a day (TID) | INTRAMUSCULAR | Status: AC | PRN

## 2014-05-12 MED ORDER — SODIUM CHLORIDE 0.9 % IJ SOLN
3.0000 mL | Freq: Two times a day (BID) | INTRAMUSCULAR | Status: DC
  Administered 2014-05-13 – 2014-05-19 (×12): 3 mL via INTRAVENOUS

## 2014-05-12 MED ORDER — ACETAMINOPHEN 650 MG RE SUPP: 650.0000 mg | Freq: Four times a day (QID) | RECTAL | Status: DC | PRN

## 2014-05-12 MED ORDER — SODIUM CHLORIDE 0.9 % IJ SOLN: 3.0000 mL | INTRAMUSCULAR | Status: DC | PRN

## 2014-05-12 MED ORDER — SODIUM CHLORIDE 0.9 % IV SOLN
INTRAVENOUS | Status: AC
  Administered 2014-05-12: 125 mL/h via INTRAVENOUS

## 2014-05-12 MED ORDER — OXYCODONE-ACETAMINOPHEN 5-325 MG PO TABS
1.0000 | ORAL_TABLET | Freq: Once | ORAL | Status: AC
  Administered 2014-05-12: 1 via ORAL
  Filled 2014-05-12: qty 1

## 2014-05-12 MED ORDER — ACETAMINOPHEN 325 MG PO TABS: 650.0000 mg | ORAL_TABLET | Freq: Four times a day (QID) | ORAL | Status: DC | PRN

## 2014-05-12 MED ORDER — MORPHINE SULFATE 2 MG/ML IJ SOLN
1.0000 mg | INTRAMUSCULAR | Status: DC | PRN
  Administered 2014-05-13 (×2): 1 mg via INTRAVENOUS
  Filled 2014-05-12 (×2): qty 1

## 2014-05-12 MED ORDER — IOHEXOL 300 MG/ML  SOLN
75.0000 mL | Freq: Once | INTRAMUSCULAR | Status: AC | PRN
  Administered 2014-05-12: 75 mL via INTRAVENOUS

## 2014-05-12 MED ORDER — HEPARIN SODIUM (PORCINE) 5000 UNIT/ML IJ SOLN
5000.0000 [IU] | Freq: Three times a day (TID) | INTRAMUSCULAR | Status: DC
  Administered 2014-05-13 – 2014-05-20 (×19): 5000 [IU] via SUBCUTANEOUS
  Filled 2014-05-12 (×24): qty 1

## 2014-05-12 MED ORDER — ALUM & MAG HYDROXIDE-SIMETH 200-200-20 MG/5ML PO SUSP
30.0000 mL | Freq: Four times a day (QID) | ORAL | Status: DC | PRN
  Administered 2014-05-19: 30 mL via ORAL
  Filled 2014-05-12: qty 30

## 2014-05-12 MED ORDER — ONDANSETRON HCL 4 MG PO TABS: 4.0000 mg | ORAL_TABLET | Freq: Four times a day (QID) | ORAL | Status: DC | PRN

## 2014-05-12 MED ORDER — ALBUTEROL SULFATE (2.5 MG/3ML) 0.083% IN NEBU: 2.5000 mg | INHALATION_SOLUTION | RESPIRATORY_TRACT | Status: DC | PRN

## 2014-05-12 MED ORDER — SODIUM CHLORIDE 0.9 % IV SOLN: 250.0000 mL | INTRAVENOUS | Status: DC | PRN

## 2014-05-12 MED ORDER — ONDANSETRON HCL 4 MG/2ML IJ SOLN: 4.0000 mg | Freq: Four times a day (QID) | INTRAMUSCULAR | Status: DC | PRN

## 2014-05-12 NOTE — ED Provider Notes (Signed)
CSN: [id number]     Arrival date & time 05/12/14  1526 History   First MD Initiated Contact with Patient 05/12/14 1600     Chief Complaint  Patient presents with  . Headache  . Cough     HPI Pt was seen at 1600. Per pt, c/o gradual onset and persistence of constant acute flair of her chronic headache and cough  for the past 3 to 4 weeks.  Describes the headache as located in her forehead, and per her usual chronic headache pain pattern for many years.  Denies headache was sudden or maximal in onset or at any time.  Denies visual changes, no focal motor weakness, no tingling/numbness in extremities, no fevers, no neck pain, no rash.  The symptoms have been associated with no other complaints. The patient has a significant history of similar symptoms previously, recently being evaluated for this complaint and multiple prior evals for same.  Pt was evaluated 2 days ago at [facility] and yesterday at an [facility] for same.  States she was given an antibiotic and cough suppressant without change in her symptoms.    Pt is also seen under the name of Demeka Turazzo at [facility]  Past Medical History  Diagnosis Date  . VP (ventriculoperitoneal) shunt status   . Chronic headaches   . Chronic cough   . 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 Ventriculopleural shunt status    Past Surgical History  Procedure Laterality Date  . Shunt      brain  . Shunt revision ventricular-peritoneal N/A 12/03/2013    Procedure: Revision of Ventricular Peritoneal Shunt;  Surgeon: Cristi Loron, MD;  Location: [facility];  Service: Neurosurgery;  Laterality: N/A;   Family History  Problem Relation Age of Onset  . Diabetes Father    History  Substance Use Topics  . Smoking status: Never Smoker   . Smokeless tobacco: Never Used  . Alcohol Use: No    Review of Systems ROS: Statement: All systems negative except as marked or noted in the HPI; Constitutional: Negative for fever and chills. ; ;  Eyes: Negative for eye pain, redness and discharge. ; ; ENMT: Negative for ear pain, hoarseness, nasal congestion, sinus pressure and sore throat. ; ; Cardiovascular: Negative for chest pain, palpitations, diaphoresis, dyspnea and peripheral edema. ; ; Respiratory: +cough. Negative for wheezing and stridor. ; ; Gastrointestinal: Negative for nausea, vomiting, diarrhea, abdominal pain, blood in stool, hematemesis, jaundice and rectal bleeding. . ; ; Genitourinary: Negative for dysuria, flank pain and hematuria. ; ; Musculoskeletal: Negative for back pain and neck pain. Negative for swelling and trauma.; ; Skin: Negative for pruritus, rash, abrasions, blisters, bruising and skin lesion.; ; Neuro: +headache. Negative for lightheadedness and neck stiffness. Negative for weakness, altered level of consciousness , altered mental status, extremity weakness, paresthesias, involuntary movement, seizure and syncope.      Allergies  Review of patient's allergies indicates no known allergies.  Home Medications   Prior to Admission medications   Medication Sig Start Date End Date Taking? Authorizing Provider  benzonatate (TESSALON) 100 MG capsule Take 1-2 capsules (100-200 mg total) by mouth 3 (three) times daily as needed for cough. 05/11/14  Yes Ethelda Chick, MD  cefdinir (OMNICEF) 300 MG capsule Take 2 capsules (600 mg total) by mouth daily. 05/11/14  Yes Ethelda Chick, MD  HYDROmorphone (DILAUDID) 2 MG tablet Take 1 tablet (2 mg total) by mouth every 6 (  six) hours as needed for severe pain. 12/30/13  Yes Vanetta MuldersScott Zackowski, MD  naproxen sodium (ANAPROX) 220 MG tablet Take 220 mg by mouth daily as needed (for pain).   Yes Historical Provider, MD  promethazine-dextromethorphan (PROMETHAZINE-DM) 6.25-15 MG/5ML syrup Take 5 mLs by mouth 4 (four) times daily as needed for cough. 05/11/14  Yes Ethelda ChickKristi M Smith, MD   BP 116/67  Pulse 80  Resp 19  SpO2 96%  LMP 04/27/2014 Physical Exam 1605: Physical examination:   Nursing notes reviewed; Vital signs and O2 SAT reviewed;  Constitutional: Well developed, Well nourished, Well hydrated, In no acute distress; Head:  Normocephalic, atraumatic; Eyes: EOMI, PERRL, No scleral icterus; ENMT: TM's clear bilat. +edemetous nasal turbinates bilat with clear rhinorrhea. Mouth and pharynx without lesions. No tonsillar exudates. No intra-oral edema. No submandibular or sublingual edema. No hoarse voice, no drooling, no stridor. No pain with manipulation of larynx. No trismus. Mouth and pharynx normal, Mucous membranes moist; Neck: Supple, Full range of motion, No lymphadenopathy; Cardiovascular: Regular rate and rhythm, No murmur, rub, or gallop; Respiratory: Breath sounds coarse & equal bilaterally, No wheezes.  Speaking full sentences with ease, Normal respiratory effort/excursion; Chest: Nontender, Movement normal; Abdomen: Soft, Nontender, Nondistended, Normal bowel sounds; Genitourinary: No CVA tenderness; Extremities: Pulses normal, No tenderness, No edema, No calf edema or asymmetry.; Neuro: AA&Ox3, Major CN grossly intact. No facial droop. Speech clear. No gross focal motor or sensory deficits in extremities.; Skin: Color normal, Warm, Dry.   ED Course  Procedures     MDM  MDM Reviewed: previous chart, nursing note and vitals Reviewed previous: labs, x-ray, CT scan and ECG Interpretation: labs, x-ray, CT scan and ECG     Date: 05/12/2014  Rate: 99  Rhythm: normal sinus rhythm  QRS Axis: normal  Intervals: normal  ST/T Wave abnormalities: normal, artifact, baseline wander  Conduction Disutrbances:none  Narrative Interpretation:   Old EKG Reviewed: unchanged, no significant changes since previous EKG dated 12/30/2013.   Results for orders placed during the hospital encounter of 05/12/14  CBC WITH DIFFERENTIAL      Result Value Ref Range   WBC 6.9  4.0 - 10.5 K/uL   RBC 4.34  3.87 - 5.11 MIL/uL   Hemoglobin 11.9 (*) 12.0 - 15.0 g/dL   HCT 78.237.0  95.636.0 - 21.346.0  %   MCV 85.3  78.0 - 100.0 fL   MCH 27.4  26.0 - 34.0 pg   MCHC 32.2  30.0 - 36.0 g/dL   RDW 08.613.3  57.811.5 - 46.915.5 %   Platelets 323  150 - 400 K/uL   Neutrophils Relative % 66  43 - 77 %   Neutro Abs 4.6  1.7 - 7.7 K/uL   Lymphocytes Relative 21  12 - 46 %   Lymphs Abs 1.5  0.7 - 4.0 K/uL   Monocytes Relative 9  3 - 12 %   Monocytes Absolute 0.6  0.1 - 1.0 K/uL   Eosinophils Relative 3  0 - 5 %   Eosinophils Absolute 0.2  0.0 - 0.7 K/uL   Basophils Relative 1  0 - 1 %   Basophils Absolute 0.1  0.0 - 0.1 K/uL  BASIC METABOLIC PANEL      Result Value Ref Range   Sodium 142  137 - 147 mEq/L   Potassium 3.5 (*) 3.7 - 5.3 mEq/L   Chloride 105  96 - 112 mEq/L   CO2 26  19 - 32 mEq/L   Glucose, Bld 92  70 - 99 mg/dL   BUN 12  6 - 23 mg/dL   Creatinine, Ser 6.57  0.50 - 1.10 mg/dL   Calcium 8.5  8.4 - 84.6 mg/dL   GFR calc non Af Amer >90  >90 mL/min   GFR calc Af Amer >90  >90 mL/min   Anion gap 11  5 - 15  LACTIC ACID, PLASMA      Result Value Ref Range   Lactic Acid, Venous 0.5  0.5 - 2.2 mmol/L   Dg Chest 2 View 05/12/2014   CLINICAL DATA:  Cough and congestion.  EXAM: CHEST  2 VIEW  COMPARISON:  05/11/2014  FINDINGS: Two views of this chest demonstrate a right ventricular-pleural shunt catheter. There appears to be slightly increased right pleural fluid with compressive atelectasis. The right pleural effusion is moderate-to-large in size. The left lung remains clear. Trachea is midline. Heart size is stable.  IMPRESSION: There is a moderate-to-large sized right pleural effusion which has slightly enlarged since 05/11/2014.  Right ventricular-pleural shunt.   Electronically Signed   By: Richarda Overlie M.D.   On: 05/12/2014 17:14   Dg Abd 1 View 05/12/2014   CLINICAL DATA:  Cough and congestion, fever, VP shunt  EXAM: ABDOMEN - 1 VIEW  COMPARISON:  None.  FINDINGS: The bowel gas pattern is normal. No radio-opaque calculi or other significant radiographic abnormality are seen. Mild stool volume.  No VP shunt tubing is identified. IUD in place over the pelvis.  IMPRESSION: Negative.   Electronically Signed   By: Christiana Pellant M.D.   On: 05/12/2014 17:12   Ct Head Wo Contrast 05/12/2014   CLINICAL DATA:  Headache.  History of VP shunt.  EXAM: CT HEAD WITHOUT CONTRAST  TECHNIQUE: Contiguous axial images were obtained from the base of the skull through the vertex without intravenous contrast.  COMPARISON:  12/04/2013  FINDINGS: Stable position of the ventriculostomy catheter. Maximal Min ventricular diameter is stable at 4.2 cm. Stable prominent temporal horns. No extra-axial fluid collections are identified. The gray-white differentiation is maintained. No findings for hemispheric infarction an or intracranial hemorrhage. The brainstem and cerebellum are grossly normal and stable.  No significant bony findings.  IMPRESSION: Stable ventriculostomy shunt catheter.  Stable appearance and size of the ventricles. No mass effect or shift.  No acute intracranial findings.   Electronically Signed   By: Loralie Champagne M.D.   On: 05/12/2014 17:01    1730:  Right pleural effusion on CXR today is increased from yesterday, which is increased from her baseline. T/C to Neurosurgery Dr. Wynetta Emery, case discussed, including:  HPI, pertinent PM/SHx, VS/PE, dx testing, ED course and treatment: pt needs admission for drainage of large pleural effusion and likely externalization of her VP shunt, he will consult, requests to also consult CTS MD and Pulmonary MD regarding drainage of pleural effusion, admit to pulmonary vs medicine service. T/C to CTS Dr. Laneta Simmers, case discussed, including:  HPI, pertinent PM/SHx, VS/PE, dx testing, ED course and treatment:  Agreeable to consult. T/C to Pulmonary Dr. Vassie Loll and Dr. Craige Cotta, case discussed, including:  HPI, pertinent PM/SHx, VS/PE, dx testing, ED course and treatment:  Agreeable to consult  T/C to [facility] Dr. Jerral Ralph, case discussed, including:  HPI, pertinent PM/SHx, VS/PE, dx testing, ED  course and treatment:  Agreeable to admit, requests to write temporary orders, obtain medical bed to team 10.    Samuel Jester, DO 05/15/14 1354

## 2014-05-12 NOTE — ED Notes (Signed)
[place] [facility] yesterday for migraine, cough x3 weeks.  Pt received antibiotic and cough suppressant yesterday along with phenergen. Pt has had n/v with migraine- none today. Today still has migraine and pleuritic chest pain.  BP 102/70. HR 96, sats 96% on room air. Shunt placed in 2001 for fluid on the brain.

## 2014-05-12 NOTE — ED Notes (Signed)
Thoracentesis kit set up at bedside. Pulmonary MD at bedside.

## 2014-05-12 NOTE — H&P (Addendum)
PATIENT DETAILS Name: Lisa LimerickROSANNA Vice Age: 38 y.o. Sex: female Date of Birth: 08/02/1976 Admit Date: 05/12/2014 PCP:Pcp Not In System   CHIEF COMPLAINT:  Cough, subjective fever   HPI: Lisa LimerickROSANNA Johns is a 38 y.o. female with a Past Medical History of hydrocephalus status post VP shunt- in February 2015, she had ventriculoperitoneal shunt malfunction and resultant hydrocephalus and had undergone a shunt revision (now Ventriculo-pleural shunt)by by Dr. Sinda DuJenkins-neurosurgery. Patient presents to the emergency room complaining of cough subjective fever. She was seen yesterday at the urgent care Center for similar complaints and was sent home on antibiotics. Because the symptoms were essentially unchanged, she presented to the emergency room for further evaluation. She also complains of a headache, cough is mostly dry, she has been afebrile here in the emergency room. Further evaluation, including a CT of the head which did not show any hydrocephalus, a chest x-ray showed worsening of right-sided pleural effusion. Emergency department M.D. has consulted pulmonology (Dr. Craige CottaSood), cardiothoracic surgery (Dr. Laneta SimmersBartle) and neurosurgery (Dr. Wynetta Emeryram) who will all consult, but have requested that hospitalization is that this patient for further evaluation and treatment.   ALLERGIES:  No Known Allergies  PAST MEDICAL HISTORY: Past Medical History  Diagnosis Date  . VP (ventriculoperitoneal) shunt status   . Chronic headaches   . Chronic cough   . 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 Ventriculopleural shunt status     PAST SURGICAL HISTORY: Past Surgical History  Procedure Laterality Date  . Shunt      brain  . Shunt revision ventricular-peritoneal N/A 12/03/2013    Procedure: Revision of Ventricular Peritoneal Shunt;  Surgeon: Cristi LoronJeffrey D Jenkins, MD;  Location: [facility];  Service: Neurosurgery;  Laterality: N/A;    MEDICATIONS AT HOME: Prior to Admission  medications   Medication Sig Start Date End Date Taking? Authorizing Provider  benzonatate (TESSALON) 100 MG capsule Take 1-2 capsules (100-200 mg total) by mouth 3 (three) times daily as needed for cough. 05/11/14  Yes Ethelda ChickKristi M Smith, MD  cefdinir (OMNICEF) 300 MG capsule Take 2 capsules (600 mg total) by mouth daily. 05/11/14  Yes Ethelda ChickKristi M Smith, MD  HYDROmorphone (DILAUDID) 2 MG tablet Take 1 tablet (2 mg total) by mouth every 6 (six) hours as needed for severe pain. 12/30/13  Yes Vanetta MuldersScott Zackowski, MD  naproxen sodium (ANAPROX) 220 MG tablet Take 220 mg by mouth daily as needed (for pain).   Yes Historical Provider, MD  promethazine-dextromethorphan (PROMETHAZINE-DM) 6.25-15 MG/5ML syrup Take 5 mLs by mouth 4 (four) times daily as needed for cough. 05/11/14  Yes Ethelda ChickKristi M Smith, MD    FAMILY HISTORY: Family History  Problem Relation Age of Onset  . Diabetes Father     SOCIAL HISTORY:  reports that she has never smoked. She has never used smokeless tobacco. She reports that she does not drink alcohol or use illicit drugs.  REVIEW OF SYSTEMS:  Constitutional:   No  weight loss, night sweats.  HEENT:    No headaches, Difficulty swallowing,Tooth/dental problems,Sore throat  Cardio-vascular: No chest pain,  Orthopnea, PND, swelling in lower extremities, anasarca, dizziness, palpitations  GI:  No heartburn, indigestion, abdominal pain, nausea, vomiting, diarrhea, change in bowel habits, loss of appetite  Resp: No shortness of breath with exertion or at rest.  No excess mucus, no productive cough  Skin:  no rash or lesions.  GU:  no dysuria, change in color of urine,  no urgency or frequency.  No flank pain.  Musculoskeletal: No joint pain or swelling.  No decreased range of motion.  No back pain.  Psych: No change in mood or affect. No depression or anxiety.  No memory loss.   PHYSICAL EXAM: Blood pressure 116/67, pulse 80, resp. rate 19, last menstrual period 04/27/2014, SpO2  96.00%.  General appearance :Awake, alert, not in any distress. Speech Clear. Not toxic Looking HEENT: Atraumatic and Normocephalic, pupils equally reactive to light and accomodation Neck: supple, no JVD. No cervical lymphadenopathy.  Chest:Good air entry bilaterally, no added sounds  CVS: S1 S2 regular, no murmurs.  Abdomen: Bowel sounds present, Non tender and not distended with no gaurding, rigidity or rebound. Extremities: B/L Lower Ext shows no edema, both legs are warm to touch Neurology: Awake alert, and oriented X 3, CN II-XII intact, Non focal Skin:No Rash Wounds:N/A  LABS ON ADMISSION:   Recent Labs  05/12/14 1617  NA 142  K 3.5*  CL 105  CO2 26  GLUCOSE 92  BUN 12  CREATININE 0.62  CALCIUM 8.5   No results found for this basename: AST, ALT, ALKPHOS, BILITOT, PROT, ALBUMIN,  in the last 72 hours No results found for this basename: LIPASE, AMYLASE,  in the last 72 hours  Recent Labs  05/11/14 1627 05/12/14 1617  WBC 8.2 6.9  NEUTROABS  --  4.6  HGB 12.8 11.9*  HCT 38.9 37.0  MCV 83.9 85.3  PLT  --  323   No results found for this basename: CKTOTAL, CKMB, CKMBINDEX, TROPONINI,  in the last 72 hours No results found for this basename: DDIMER,  in the last 72 hours No components found with this basename: POCBNP,    RADIOLOGIC STUDIES ON ADMISSION: Dg Chest 2 View  05/12/2014   CLINICAL DATA:  Cough and congestion.  EXAM: CHEST  2 VIEW  COMPARISON:  05/11/2014  FINDINGS: Two views of this chest demonstrate a right ventricular-pleural shunt catheter. There appears to be slightly increased right pleural fluid with compressive atelectasis. The right pleural effusion is moderate-to-large in size. The left lung remains clear. Trachea is midline. Heart size is stable.  IMPRESSION: There is a moderate-to-large sized right pleural effusion which has slightly enlarged since 05/11/2014.  Right ventricular-pleural shunt.   Electronically Signed   By: Richarda Overlie M.D.   On:  05/12/2014 17:14   Dg Chest 2 View  05/11/2014   CLINICAL DATA:  Cough and decreased breath sounds on the right  EXAM: CHEST  2 VIEW  COMPARISON:  Chest radiograph December 30, 2013 and chest CT December 30, 2013  FINDINGS: There is a persistent pleural effusion on the right which may be slightly increased. There is atelectasis and possible consolidation in the right middle and lower lobes. The left lung is clear. Heart size and pulmonary vascularity are normal. No adenopathy. There is a shunt catheter on the right which appears coiled in the right hemi thorax.  IMPRESSION: Shunt catheter appears coiled in the right hemithorax. The sizable effusion on the right may at least in part be due to fluid collecting in the right chest from the shunt catheter. There is probable compressive atelectasis in the right middle and lower lobes. Left lung is clear.  These results will be called to the ordering clinician or representative by the Radiologist Assistant, and communication documented in the PACS or zVision Dashboard.   Electronically Signed   By: Bretta Bang M.D.   On: 05/11/2014 16:38  Dg Abd 1 View  05/12/2014   CLINICAL DATA:  Cough and congestion, fever, VP shunt  EXAM: ABDOMEN - 1 VIEW  COMPARISON:  None.  FINDINGS: The bowel gas pattern is normal. No radio-opaque calculi or other significant radiographic abnormality are seen. Mild stool volume. No VP shunt tubing is identified. IUD in place over the pelvis.  IMPRESSION: Negative.   Electronically Signed   By: Christiana Pellant M.D.   On: 05/12/2014 17:12   Ct Head Wo Contrast  05/12/2014   CLINICAL DATA:  Headache.  History of VP shunt.  EXAM: CT HEAD WITHOUT CONTRAST  TECHNIQUE: Contiguous axial images were obtained from the base of the skull through the vertex without intravenous contrast.  COMPARISON:  12/04/2013  FINDINGS: Stable position of the ventriculostomy catheter. Maximal Min ventricular diameter is stable at 4.2 cm. Stable prominent temporal horns.  No extra-axial fluid collections are identified. The gray-white differentiation is maintained. No findings for hemispheric infarction an or intracranial hemorrhage. The brainstem and cerebellum are grossly normal and stable.  No significant bony findings.  IMPRESSION: Stable ventriculostomy shunt catheter.  Stable appearance and size of the ventricles. No mass effect or shift.  No acute intracranial findings.   Electronically Signed   By: Loralie Champagne M.D.   On: 05/12/2014 17:01    ASSESSMENT AND PLAN: Present on Admission:  . Pleural effusion, right - Etiology uncertain, PCCM currently at bedside planning on ultrasound guided thoracocentesis. Pleural fluid will be sent off for analysis, likely would undergo a CT of the chest post thoracocentesis. Once results from pleural fluid analysis is back, would need input from cardiovascular and neurosurgical service to determine the next course of action. For now, would admit, and provide supportive care. Looks nontoxic, is afebrile and does not have leukocytosis, I would hold off on starting antibiotics at this time.Please note, her shunt is ventricular-pleural- since the revision in February 2015.  . Subjective fever/cough/Headache - Has a right-sided pleural effusion, would obtain a CT of the chest after thoracocentesis. Looks nontoxic, afebrile, neck is supple without any meningeal signs. Would hold off on starting antibiotics at this time.Patient is hemodynamically stable as well. Please note, her shunt is ventricular-pleural- since the revision in February 2015.   Further plan will depend as patient's clinical course evolves and further radiologic and laboratory data become available. Patient will be monitored closely.  Above noted plan was discussed with patient/family, they were in agreement.   DVT Prophylaxis: ProphylacticHeparin  Code Status: Full Code  Total time spent for admission equals 45 minutes.  [facility] Pager [phone]  If 7PM-7AM, please contact night-coverage [email or web address] Password [email or web address] 05/12/2014, 7:14 PM  **Disclaimer: This note may have been dictated with voice recognition software. Similar sounding words can inadvertently be transcribed and this note may contain transcription errors which may not have been corrected upon publication of note.**

## 2014-05-12 NOTE — ED Notes (Signed)
Obtained consent for procedure 

## 2014-05-12 NOTE — ED Notes (Signed)
Per MD, 1500cc of fluid drained from effusion.

## 2014-05-12 NOTE — Progress Notes (Signed)
05/12/14 20:01 Wendi Maya RN BSN NCM [phone] Further Evaluation determined patient to have right pleural effusion, bedside Thoracentesis performed patient being admitted. ED CM will notify [facility] of patient for hospital follow up upon discharge.  Unit CM will follow up for discharge planning.   ED CM noted patient to be without a PCP. Pt presented from Urgent Care with c/o migraine headache, cough, and some pleuritic CP. Hx VP shunt 2001. Met with patient and family at bedside, consent was given to discuss care in the presence of family. Pt reports not having a PCP.  Patient has Medicaid. Discussed with patient importance and benefits of establishing care with PCP, and not utilizing the ED for primary care needs. Pt verbalized understanding and is in agreement. Discussed other options, provided list of local PCP. Offered to assist with finding a  PCP Brochure given for the [facility] with address, phone number, and the services provided.  Patient agreeable with plan for assistance.  verbalized understanding teach back done. ED evaluation still in progress. CM will follow up with disposition plan.

## 2014-05-12 NOTE — Consult Note (Signed)
Reason for Consult: Rule out shunt malfunction Referring Physician: Emergency department  [name] is an 38 y.o. female.  HPI: Patient is a 38 year female with a long-standing competent medical history with a ventricular peritoneal shunt that actually probably resides in the pleural space is unclear when this particular distal catheter was placed however she did undergo a proximal shunt revision back in February. The family reports patient had some chronic problems with headache intermittent nausea and coughing ever since the revision back in February. The patient was worked up with CT scan in March as well as x-rays at that time that showed a very small amount of pleural fluid but no other complicating feature. Patient presented to the nursing care he is seen again today with coughing and some shortness of breath and chest x-rays have showed an enlarging pleural effusion. Patient historically denies any history of fevers did have a headache on Sunday had an episode of vomiting has had some occasional nausea that this is been a problem is plagued her for several weeks and months. Currently she feels great she is having no headache no nausea and breathing coughing and she is improved following a thoracentesis performed by critical care medicine.  Past Medical History  Diagnosis Date  . VP (ventriculoperitoneal) shunt status   . Chronic headaches   . Chronic cough   . 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 Ventriculopleural shunt status     Past Surgical History  Procedure Laterality Date  . Shunt      brain  . Shunt revision ventricular-peritoneal N/A 12/03/2013    Procedure: Revision of Ventricular Peritoneal Shunt;  Surgeon: Ophelia Charter, MD;  Location: [place] [facility];  Service: Neurosurgery;  Laterality: N/A;    Family History  Problem Relation Age of Onset  . Diabetes Father     Social History:  reports that she has never smoked. She has never used  smokeless tobacco. She reports that she does not drink alcohol or use illicit drugs.  Allergies: No Known Allergies  Medications: I have reviewed the patient's current medications.  Results for orders placed during the hospital encounter of 05/12/14 (from the past 48 hour(s))  CBC WITH DIFFERENTIAL     Status: Abnormal   Collection Time    05/12/14  4:17 PM      Result Value Ref Range   WBC 6.9  4.0 - 10.5 K/uL   RBC 4.34  3.87 - 5.11 MIL/uL   Hemoglobin 11.9 (*) 12.0 - 15.0 g/dL   HCT 37.0  36.0 - 46.0 %   MCV 85.3  78.0 - 100.0 fL   MCH 27.4  26.0 - 34.0 pg   MCHC 32.2  30.0 - 36.0 g/dL   RDW 13.3  11.5 - 15.5 %   Platelets 323  150 - 400 K/uL   Neutrophils Relative % 66  43 - 77 %   Neutro Abs 4.6  1.7 - 7.7 K/uL   Lymphocytes Relative 21  12 - 46 %   Lymphs Abs 1.5  0.7 - 4.0 K/uL   Monocytes Relative 9  3 - 12 %   Monocytes Absolute 0.6  0.1 - 1.0 K/uL   Eosinophils Relative 3  0 - 5 %   Eosinophils Absolute 0.2  0.0 - 0.7 K/uL   Basophils Relative 1  0 - 1 %   Basophils Absolute 0.1  0.0 - 0.1 K/uL  BASIC METABOLIC PANEL  Status: Abnormal   Collection Time    05/12/14  4:17 PM      Result Value Ref Range   Sodium 142  137 - 147 mEq/L   Potassium 3.5 (*) 3.7 - 5.3 mEq/L   Chloride 105  96 - 112 mEq/L   CO2 26  19 - 32 mEq/L   Glucose, Bld 92  70 - 99 mg/dL   BUN 12  6 - 23 mg/dL   Creatinine, Ser 0.62  0.50 - 1.10 mg/dL   Calcium 8.5  8.4 - 10.5 mg/dL   GFR calc non Af Amer >90  >90 mL/min   GFR calc Af Amer >90  >90 mL/min   Comment: (NOTE)     The eGFR has been calculated using the CKD EPI equation.     This calculation has not been validated in all clinical situations.     eGFR's persistently <90 mL/min signify possible Chronic Kidney     Disease.   Anion gap 11  5 - 15  LACTIC ACID, PLASMA     Status: None   Collection Time    05/12/14  4:19 PM      Result Value Ref Range   Lactic Acid, Venous 0.5  0.5 - 2.2 mmol/L  BODY FLUID CELL COUNT WITH  DIFFERENTIAL     Status: Abnormal   Collection Time    05/12/14  7:50 PM      Result Value Ref Range   Fluid Type-FCT PLEURAL     Comment: FLUID     RIGHT     CORRECTED ON 08/05 AT 1959: PREVIOUSLY REPORTED AS Body Fluid   Color, Fluid YELLOW (*) YELLOW   Appearance, Fluid HAZY (*) CLEAR   WBC, Fluid 2929 (*) 0 - 1000 cu mm   Neutrophil Count, Fluid PENDING  0 - 25 %   Lymphs, Fluid PENDING     Monocyte-Macrophage-Serous Fluid PENDING  50 - 90 %   Eos, Fluid PENDING     Other Cells, Fluid PENDING      Dg Chest 2 View  05/12/2014   CLINICAL DATA:  Cough and congestion.  EXAM: CHEST  2 VIEW  COMPARISON:  05/11/2014  FINDINGS: Two views of this chest demonstrate a right ventricular-pleural shunt catheter. There appears to be slightly increased right pleural fluid with compressive atelectasis. The right pleural effusion is moderate-to-large in size. The left lung remains clear. Trachea is midline. Heart size is stable.  IMPRESSION: There is a moderate-to-large sized right pleural effusion which has slightly enlarged since 05/11/2014.  Right ventricular-pleural shunt.   Electronically Signed   By: Markus Daft M.D.   On: 05/12/2014 17:14   Dg Chest 2 View  05/11/2014   CLINICAL DATA:  Cough and decreased breath sounds on the right  EXAM: CHEST  2 VIEW  COMPARISON:  Chest radiograph December 30, 2013 and chest CT December 30, 2013  FINDINGS: There is a persistent pleural effusion on the right which may be slightly increased. There is atelectasis and possible consolidation in the right middle and lower lobes. The left lung is clear. Heart size and pulmonary vascularity are normal. No adenopathy. There is a shunt catheter on the right which appears coiled in the right hemi thorax.  IMPRESSION: Shunt catheter appears coiled in the right hemithorax. The sizable effusion on the right may at least in part be due to fluid collecting in the right chest from the shunt catheter. There is probable compressive atelectasis  in the right middle  and lower lobes. Left lung is clear.  These results will be called to the ordering clinician or representative by the Radiologist Assistant, and communication documented in the PACS or zVision Dashboard.   Electronically Signed   By: Lowella Grip M.D.   On: 05/11/2014 16:38   Dg Abd 1 View  05/12/2014   CLINICAL DATA:  Cough and congestion, fever, VP shunt  EXAM: ABDOMEN - 1 VIEW  COMPARISON:  None.  FINDINGS: The bowel gas pattern is normal. No radio-opaque calculi or other significant radiographic abnormality are seen. Mild stool volume. No VP shunt tubing is identified. IUD in place over the pelvis.  IMPRESSION: Negative.   Electronically Signed   By: Conchita Paris M.D.   On: 05/12/2014 17:12   Ct Head Wo Contrast  05/12/2014   CLINICAL DATA:  Headache.  History of VP shunt.  EXAM: CT HEAD WITHOUT CONTRAST  TECHNIQUE: Contiguous axial images were obtained from the base of the skull through the vertex without intravenous contrast.  COMPARISON:  12/04/2013  FINDINGS: Stable position of the ventriculostomy catheter. Maximal Min ventricular diameter is stable at 4.2 cm. Stable prominent temporal horns. No extra-axial fluid collections are identified. The gray-white differentiation is maintained. No findings for hemispheric infarction an or intracranial hemorrhage. The brainstem and cerebellum are grossly normal and stable.  No significant bony findings.  IMPRESSION: Stable ventriculostomy shunt catheter.  Stable appearance and size of the ventricles. No mass effect or shift.  No acute intracranial findings.   Electronically Signed   By: Kalman Jewels M.D.   On: 05/12/2014 17:01   Dg Chest Port 1 View  05/12/2014   CLINICAL DATA:  Post thoracentesis  EXAM: PORTABLE CHEST - 1 VIEW  COMPARISON:  05/12/2014  FINDINGS: There is small to moderate right pleural effusion with right basilar atelectasis or infiltrate. No pulmonary edema. No pneumothorax. Right ventricular pleural catheter is  stable in position.  IMPRESSION: Small to moderate right pleural effusion with right basilar atelectasis or infiltrate. No pulmonary edema. No pneumothorax.   Electronically Signed   By: Lahoma Crocker M.D.   On: 05/12/2014 20:01    Review of Systems  Constitutional: Negative.   HENT: Positive for congestion.   Respiratory: Positive for cough.   Cardiovascular: Positive for chest pain.  Gastrointestinal: Positive for nausea.  Skin: Negative.   Neurological: Positive for headaches.  Endo/Heme/Allergies: Negative.    Blood pressure 116/67, pulse 80, resp. rate 19, last menstrual period 04/27/2014, SpO2 96.00%. Physical Exam  Constitutional: She is oriented to person, place, and time. She appears well-developed and well-nourished.  HENT:  Head: Normocephalic.  Eyes: Pupils are equal, round, and reactive to light.  Neck: Normal range of motion.  Respiratory: Effort normal.  GI: Soft.  Neurological: She is alert and oriented to person, place, and time. She has normal strength. GCS eye subscore is 4. GCS verbal subscore is 5. GCS motor subscore is 6.  Patient is awake alert oriented x4 communicated with her broken English and through her family member who interpreted pupils are equal and reactive extraocular movements are intact cranial nerves are intact strength is 5 out of 5 in her upper and lower extremities  Skin: Skin is warm and dry.    Assessment/Plan: 38 year old female presents with a ventriculopleural shunt and a pleural effusion of unclear and unknown etiology. It is unclear whether this represents an infection or a pseudocyst and portal to phenomena. Clinically the patient actually looks extremely good does not look toxic therefore  I think it does not appear as if it's a  bacterial infection. Critical-care medicine did perform a thoracentesis and the results of this fluid testing is pending. If the fluid comes back infected consideration will have to be given to externalize in the shunt  system your by just making a small incision through the skin and removing the catheter and tying into a collecting system or replacing the whole system with a ventricular catheter hooked to an external collecting system. If the fluid comes back not infected, then the whole shunt system I need to be revised at the distal catheter being placed in an alternate location. Due to her long complicated history of shunt problems consideration they begin to general surgery repositioning this throughout laparoscopic technique. Patient will be admitted and observed awaiting results of the fluid to [facility] hospitalist service.  Al Gagen P 05/12/2014, 8:41 PM

## 2014-05-12 NOTE — Procedures (Signed)
Thoracentesis Procedure Note  Pre-operative Diagnosis: Right pleural effusion  Post-operative Diagnosis: same  Indications: 38 yo female with hx of VP shunt and progressive right pleural effusion.  Procedure Details  Consent: Informed consent was obtained. Risks of the procedure were discussed including: infection, bleeding, pain, pneumothorax.  Under sterile conditions the patient was positioned. Betadine solution and sterile drapes were utilized.  1% plain lidocaine was used to anesthetize the 6 rib space. Fluid was obtained without any difficulties and minimal blood loss.  A dressing was applied to the wound and wound care instructions were provided.   Findings 1500 ml of clear pleural fluid was obtained. A sample was sent to Pathology for cytogenetics, flow, and cell counts, as well as for infection analysis.  Complications:  None; patient tolerated the procedure well.          Condition: stable  Plan Post procedure chest xray pending Fluid sent for glucose, protein, LDH, amylase, albumin, amylase, cell count with differential, gram stain and culture, AFB smear and culture, fungal smear and culture, and cytology  Performed by Rutherford Guysahul Desai, PA with ultrasound guidance.  Attending Attestation: I was present for the entire procedure.  Coralyn HellingVineet Kenora Spayd, MD [facility] 05/12/2014, 7:48 PM Pager:  [phone] After 3pm call: [phone]

## 2014-05-12 NOTE — ED Notes (Signed)
MDs at bedside performing thoracentesis.

## 2014-05-12 NOTE — Consult Note (Signed)
Name: [name] MRN: [id number] DOB: 13-Oct-1975    ADMISSION DATE:  05/12/2014 CONSULTATION DATE:  05/12/2014  REFERRING MD :  Samuel Jester  CHIEF COMPLAINT:  Headache  BRIEF PATIENT DESCRIPTION:  38 yo female with hx of VP shunt presented with headache and dry cough.  Found to have large Rt pleural effusion.  PCCM consulted to assist with pleural effusion evaluation.  SIGNIFICANT EVENTS: 8/05 Admit  STUDIES:  8/05 Rt pleural fluid >>  HISTORY OF PRESENT ILLNESS:   38 yo female presented to urgent care on 8/04 with headache, vomiting, fever 101F and chills.  She was given script for omnicef.  She presented to ED with persistent headache.  CXR showed large Rt pleural effusion.  This has progressed compared to chest imaging studies from earlier this year.  Pt reports that she has never had fluid sampling from her chest.  She still has headache and dry cough.  She denies blurred vision, change in voice, weakness/numbness, chest pain, abdominal pain, leg swelling, or skin rashes.  She was in hospital in February 2015 with VP shunt malfunction and required revision.  She has hx of enterococcal meningitis.  PAST MEDICAL HISTORY :  Past Medical History  Diagnosis Date  . VP (ventriculoperitoneal) shunt status   . Chronic headaches   . Chronic cough   . 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 Shunt      brain  . Shunt revision ventricular-peritoneal N/A 12/03/2013    Procedure: Revision of Ventricular Peritoneal Shunt;  Surgeon: Cristi Loron, MD;  Location: [facility];  Service: Neurosurgery;  Laterality: N/A;   Prior to Admission medications   Medication Sig Start Date End Date Taking? Authorizing Provider  benzonatate (TESSALON) 100 MG capsule Take 1-2 capsules (100-200 mg total) by mouth 3 (three) times daily as needed for cough. 05/11/14  Yes Ethelda Chick, MD  cefdinir (OMNICEF) 300 MG  capsule Take 2 capsules (600 mg total) by mouth daily. 05/11/14  Yes Ethelda Chick, MD  HYDROmorphone (DILAUDID) 2 MG tablet Take 1 tablet (2 mg total) by mouth every 6 (six) hours as needed for severe pain. 12/30/13  Yes Vanetta Mulders, MD  naproxen sodium (ANAPROX) 220 MG tablet Take 220 mg by mouth daily as needed (for pain).   Yes Historical Provider, MD  promethazine-dextromethorphan (PROMETHAZINE-DM) 6.25-15 MG/5ML syrup Take 5 mLs by mouth 4 (four) times daily as needed for cough. 05/11/14  Yes Ethelda Chick, MD   No Known Allergies  FAMILY HISTORY:  Family History  Problem Relation Age of Onset  . Diabetes Father    SOCIAL HISTORY:  reports that she has never smoked. She has never used smokeless tobacco. She reports that she does not drink alcohol or use illicit drugs.  REVIEW OF SYSTEMS:   Negative except above  SUBJECTIVE:   VITAL SIGNS: Pulse Rate:  [85-91] 85 (08/05 1630) Resp:  [15-28] 15 (08/05 1630) BP: (117-121)/(70-74) 121/74 mmHg (08/05 1630) SpO2:  [96 %-97 %] 96 % (08/05 1630)  PHYSICAL EXAMINATION: General: no distress, sitting in ER bed Neuro:  Alert, normal strength, CN intact HEENT:  Pupils reactive, no oral exudate, no LAN Cardiovascular: regular, no murmur Lungs:  Decreased breath sounds 1/2 up Rt lung field Abdomen:  Soft, non tender, normal bowel sounds Musculoskeletal:  No edema Skin:  No rashes  CBC Recent Labs     05/11/14  1627  05/12/14  1617  WBC  8.2  6.9  HGB  12.8  11.9*  HCT  38.9  37.0  PLT   --   323   BMET Recent Labs     05/12/14  1617  NA  142  K  3.5*  CL  105  CO2  26  BUN  12  CREATININE  0.62  GLUCOSE  92    Electrolytes Recent Labs     05/12/14  1617  CALCIUM  8.5    Imaging Dg Chest 2 View  05/12/2014   CLINICAL DATA:  Cough and congestion.  EXAM: CHEST  2 VIEW  COMPARISON:  05/11/2014  FINDINGS: Two views of this chest demonstrate a right ventricular-pleural shunt catheter. There appears to be slightly  increased right pleural fluid with compressive atelectasis. The right pleural effusion is moderate-to-large in size. The left lung remains clear. Trachea is midline. Heart size is stable.  IMPRESSION: There is a moderate-to-large sized right pleural effusion which has slightly enlarged since 05/11/2014.  Right ventricular-pleural shunt.   Electronically Signed   By: Richarda Overlie M.D.   On: 05/12/2014 17:14   Dg Chest 2 View  05/11/2014   CLINICAL DATA:  Cough and decreased breath sounds on the right  EXAM: CHEST  2 VIEW  COMPARISON:  Chest radiograph December 30, 2013 and chest CT December 30, 2013  FINDINGS: There is a persistent pleural effusion on the right which may be slightly increased. There is atelectasis and possible consolidation in the right middle and lower lobes. The left lung is clear. Heart size and pulmonary vascularity are normal. No adenopathy. There is a shunt catheter on the right which appears coiled in the right hemi thorax.  IMPRESSION: Shunt catheter appears coiled in the right hemithorax. The sizable effusion on the right may at least in part be due to fluid collecting in the right chest from the shunt catheter. There is probable compressive atelectasis in the right middle and lower lobes. Left lung is clear.  These results will be called to the ordering clinician or representative by the Radiologist Assistant, and communication documented in the PACS or zVision Dashboard.   Electronically Signed   By: Bretta Bang M.D.   On: 05/11/2014 16:38   Dg Abd 1 View  05/12/2014   CLINICAL DATA:  Cough and congestion, fever, VP shunt  EXAM: ABDOMEN - 1 VIEW  COMPARISON:  None.  FINDINGS: The bowel gas pattern is normal. No radio-opaque calculi or other significant radiographic abnormality are seen. Mild stool volume. No VP shunt tubing is identified. IUD in place over the pelvis.  IMPRESSION: Negative.   Electronically Signed   By: Christiana Pellant M.D.   On: 05/12/2014 17:12   Ct Head Wo  Contrast  05/12/2014   CLINICAL DATA:  Headache.  History of VP shunt.  EXAM: CT HEAD WITHOUT CONTRAST  TECHNIQUE: Contiguous axial images were obtained from the base of the skull through the vertex without intravenous contrast.  COMPARISON:  12/04/2013  FINDINGS: Stable position of the ventriculostomy catheter. Maximal Min ventricular diameter is stable at 4.2 cm. Stable prominent temporal horns. No extra-axial fluid collections are identified. The gray-white differentiation is maintained. No findings for hemispheric infarction an or intracranial hemorrhage. The brainstem and cerebellum are grossly normal and stable.  No significant bony findings.  IMPRESSION: Stable ventriculostomy shunt catheter.  Stable appearance and size of the ventricles. No mass effect or shift.  No acute intracranial findings.  Electronically Signed   By: Loralie ChampagneMark  Gallerani M.D.   On: 05/12/2014 17:01       ASSESSMENT / PLAN:  38 yo female with large Rt pleural effusion in setting of VP shunt draining into rt pleural space.  She has fairly recent onset of fever, headache, nausea/vomiting.  Rt pleural effusion. Plan: Will proceed with thoracentesis and send fluid for analysis  VP shunt. Plan:  She will need evaluation by neurosurgery >> consult called by ER physician  Fever. Plan: Defer Abx to [facility]  Updated pt's family at bedside.   Thoracentesis procedure explained.  Risks detailed as bleeding, pneumothorax, and infection.  Pt is agreeable to procedure.  Coralyn HellingVineet Arlind Klingerman, MD [facility] Pulmonary/Critical Care 05/12/2014, 7:09 PM Pager:  [phone] After 3pm call: [phone]

## 2014-05-13 DIAGNOSIS — R05 Cough: Secondary | ICD-10-CM

## 2014-05-13 DIAGNOSIS — R059 Cough, unspecified: Secondary | ICD-10-CM

## 2014-05-13 DIAGNOSIS — Z982 Presence of cerebrospinal fluid drainage device: Secondary | ICD-10-CM

## 2014-05-13 LAB — COMPREHENSIVE METABOLIC PANEL
ALK PHOS: 79 U/L (ref 39–117)
ALT: 14 U/L (ref 0–35)
ANION GAP: 11 (ref 5–15)
AST: 13 U/L (ref 0–37)
Albumin: 2.7 g/dL — ABNORMAL LOW (ref 3.5–5.2)
BUN: 6 mg/dL (ref 6–23)
CHLORIDE: 105 meq/L (ref 96–112)
CO2: 25 meq/L (ref 19–32)
Calcium: 8.5 mg/dL (ref 8.4–10.5)
Creatinine, Ser: 0.6 mg/dL (ref 0.50–1.10)
GLUCOSE: 80 mg/dL (ref 70–99)
POTASSIUM: 3.6 meq/L — AB (ref 3.7–5.3)
Sodium: 141 mEq/L (ref 137–147)
Total Bilirubin: 0.9 mg/dL (ref 0.3–1.2)
Total Protein: 6.7 g/dL (ref 6.0–8.3)

## 2014-05-13 LAB — CBC
HEMATOCRIT: 38.4 % (ref 36.0–46.0)
HEMOGLOBIN: 12.3 g/dL (ref 12.0–15.0)
MCH: 27.5 pg (ref 26.0–34.0)
MCHC: 32 g/dL (ref 30.0–36.0)
MCV: 85.7 fL (ref 78.0–100.0)
Platelets: 319 10*3/uL (ref 150–400)
RBC: 4.48 MIL/uL (ref 3.87–5.11)
RDW: 13.3 % (ref 11.5–15.5)
WBC: 5.9 10*3/uL (ref 4.0–10.5)

## 2014-05-13 LAB — AMYLASE, PLEURAL FLUID: Amylase, Pleural Fluid: 40 U/L

## 2014-05-13 NOTE — Progress Notes (Signed)
PROGRESS NOTE  [name] ZOX:[id number] DOB: [birth year] DOA: 05/12/2014 PCP: Pcp Not In System  Assessment/Plan: Right Pleural Effusion -pt has ventriculopleural shunt with revision on 12/04/13 -Thoracocentesis shows WBC 2929 with 50Mono/22E/28N -Concerned about ventriculopleural shunt infection -Consulted infectious disease--spoke with Dr. Orvan Falconer -For antibiotics to ID -Fluid suggestive of exudate -Gram stain and culture negative of pleural fluid at 24 hours -Blood cultures negative at 24 hours -05/12/2014 thoracocentesis--performed by  Dr. Craige Cotta Headache -Likely related to ventriculopleural shunt -Headache improved after thoracocentesis -CT brain shows no acute findings -Appreciate neurosurgery consultation  Family Communication:   Husband at beside Disposition Plan:   Home when medically stable       Procedures/Studies: Dg Chest 2 View  05/12/2014   CLINICAL DATA:  Cough and congestion.  EXAM: CHEST  2 VIEW  COMPARISON:  05/11/2014  FINDINGS: Two views of this chest demonstrate a right ventricular-pleural shunt catheter. There appears to be slightly increased right pleural fluid with compressive atelectasis. The right pleural effusion is moderate-to-large in size. The left lung remains clear. Trachea is midline. Heart size is stable.  IMPRESSION: There is a moderate-to-large sized right pleural effusion which has slightly enlarged since 05/11/2014.  Right ventricular-pleural shunt.   Electronically Signed   By: Richarda Overlie M.D.   On: 05/12/2014 17:14   Dg Chest 2 View  05/11/2014   CLINICAL DATA:  Cough and decreased breath sounds on the right  EXAM: CHEST  2 VIEW  COMPARISON:  Chest radiograph December 30, 2013 and chest CT December 30, 2013  FINDINGS: There is a persistent pleural effusion on the right which may be slightly increased. There is atelectasis and possible consolidation in the right middle and lower lobes. The left lung is clear. Heart size and pulmonary  vascularity are normal. No adenopathy. There is a shunt catheter on the right which appears coiled in the right hemi thorax.  IMPRESSION: Shunt catheter appears coiled in the right hemithorax. The sizable effusion on the right may at least in part be due to fluid collecting in the right chest from the shunt catheter. There is probable compressive atelectasis in the right middle and lower lobes. Left lung is clear.  These results will be called to the ordering clinician or representative by the Radiologist Assistant, and communication documented in the PACS or zVision Dashboard.   Electronically Signed   By: Bretta Bang M.D.   On: 05/11/2014 16:38   Dg Abd 1 View  05/12/2014   CLINICAL DATA:  Cough and congestion, fever, VP shunt  EXAM: ABDOMEN - 1 VIEW  COMPARISON:  None.  FINDINGS: The bowel gas pattern is normal. No radio-opaque calculi or other significant radiographic abnormality are seen. Mild stool volume. No VP shunt tubing is identified. IUD in place over the pelvis.  IMPRESSION: Negative.   Electronically Signed   By: Christiana Pellant M.D.   On: 05/12/2014 17:12   Ct Head Wo Contrast  05/12/2014   CLINICAL DATA:  Headache.  History of VP shunt.  EXAM: CT HEAD WITHOUT CONTRAST  TECHNIQUE: Contiguous axial images were obtained from the base of the skull through the vertex without intravenous contrast.  COMPARISON:  12/04/2013  FINDINGS: Stable position of the ventriculostomy catheter. Maximal Min ventricular diameter is stable at 4.2 cm. Stable prominent temporal horns. No extra-axial fluid collections are identified. The gray-white differentiation is maintained. No findings for hemispheric infarction an or intracranial hemorrhage. The brainstem and cerebellum are grossly  normal and stable.  No significant bony findings.  IMPRESSION: Stable ventriculostomy shunt catheter.  Stable appearance and size of the ventricles. No mass effect or shift.  No acute intracranial findings.   Electronically Signed    By: Loralie ChampagneMark  Gallerani M.D.   On: 05/12/2014 17:01   Ct Chest W Contrast  05/12/2014   CLINICAL DATA:  Progressive right pleural effusion. Ventricular shunt  EXAM: CT CHEST WITH CONTRAST  TECHNIQUE: Multidetector CT imaging of the chest was performed during intravenous contrast administration.  CONTRAST:  75mL OMNIPAQUE IOHEXOL 300 MG/ML  SOLN  COMPARISON:  CT chest 12/30/2013  FINDINGS: Ventricular pleural shunt is present with the shunt tubing terminating in the pleural space on the right posteriorly, unchanged from the prior study. Moderate right pleural effusion has progressed. There is increased loculated fluid anteriorly in the base on the right. Progression of right lower lobe atelectasis.  Negative for pleural effusion on the left. No mass or adenopathy is detected.  The aortic arch is normal.  Pulmonary arteries opacified normally.  IMPRESSION: Mild increase in right pleural effusion with a loculated component anteriorly. Increase in right lower lobe compressive atelectasis.  Left lung remains clear.   Electronically Signed   By: Marlan Palauharles  Clark M.D.   On: 05/12/2014 21:02   Dg Chest Port 1 View  05/12/2014   CLINICAL DATA:  Post thoracentesis  EXAM: PORTABLE CHEST - 1 VIEW  COMPARISON:  05/12/2014  FINDINGS: There is small to moderate right pleural effusion with right basilar atelectasis or infiltrate. No pulmonary edema. No pneumothorax. Right ventricular pleural catheter is stable in position.  IMPRESSION: Small to moderate right pleural effusion with right basilar atelectasis or infiltrate. No pulmonary edema. No pneumothorax.   Electronically Signed   By: Natasha MeadLiviu  Pop M.D.   On: 05/12/2014 20:01         Subjective: Patient complains of mild headache this morning without any fevers. Denies any nausea, vomiting, diarrhea, abdominal pain. Denies any chest discomfort shortness of breath. No dysuria.  Objective: Filed Vitals:   05/12/14 1830 05/12/14 1845 05/12/14 2052 05/13/14 0501  BP: 115/60  116/67 120/78 106/70  Pulse: 76 80 84 71  Temp:   98.4 F (36.9 C) 98.4 F (36.9 C)  TempSrc:   Oral Oral  Resp: 17 19 16 16   Height:   5\' 6"  (1.676 m)   Weight:   65 kg (143 lb 4.8 oz)   SpO2: 94% 96% 96% 97%    Intake/Output Summary (Last 24 hours) at 05/13/14 0816 Last data filed at 05/13/14 0700  Gross per 24 hour  Intake   1025 ml  Output      0 ml  Net   1025 ml   Weight change:  Exam:   General:  Pt is alert, follows commands appropriately, not in acute distress  HEENT: No icterus, No thrush,  [place]/AT  Cardiovascular: RRR, S1/S2, no rubs, no gallops  Respiratory: R-diminished breath sounds, left clear to auscultation. No wheeze  Abdomen: Soft/+BS, non tender, non distended, no guarding  Extremities: trace LE edema, No lymphangitis, No petechiae, No rashes, no synovitis  Data Reviewed: Basic Metabolic Panel:  Recent Labs Lab 05/12/14 1617  NA 142  K 3.5*  CL 105  CO2 26  GLUCOSE 92  BUN 12  CREATININE 0.62  CALCIUM 8.5   Liver Function Tests: No results found for this basename: AST, ALT, ALKPHOS, BILITOT, PROT, ALBUMIN,  in the last 168 hours No results found for this basename: LIPASE,  AMYLASE,  in the last 168 hours No results found for this basename: AMMONIA,  in the last 168 hours CBC:  Recent Labs Lab 05/11/14 1627 05/12/14 1617  WBC 8.2 6.9  NEUTROABS  --  4.6  HGB 12.8 11.9*  HCT 38.9 37.0  MCV 83.9 85.3  PLT  --  323   Cardiac Enzymes: No results found for this basename: CKTOTAL, CKMB, CKMBINDEX, TROPONINI,  in the last 168 hours BNP: No components found with this basename: POCBNP,  CBG: No results found for this basename: GLUCAP,  in the last 168 hours  Recent Results (from the past 240 hour(s))  BODY FLUID CULTURE     Status: None   Collection Time    05/12/14  7:50 PM      Result Value Ref Range Status   Specimen Description PLEURAL FLUID RIGHT   Final   Special Requests NONE   Final   Gram Stain     Final   Value: RARE  WBC PRESENT,BOTH PMN AND MONONUCLEAR     NO ORGANISMS SEEN     Performed at [facility]   Culture     Final   Value: NO GROWTH     Performed at [facility]   Report Status PENDING   Incomplete     Scheduled Meds: . sodium chloride   Intravenous STAT  . heparin  5,000 Units Subcutaneous 3 times per day  . senna  1 tablet Oral BID  . sodium chloride  3 mL Intravenous Q12H   Continuous Infusions:    Tigerlily Christine, DO  [facility] Pager [phone]  If 7PM-7AM, please contact night-coverage [email or web address] Password [email or web address] 05/13/2014, 8:16 AM   LOS: 1 day

## 2014-05-13 NOTE — Progress Notes (Signed)
Patient ID: [name], female   DOB: 17-Nov-1975, 38 y.o.   MRN: [id number] Subjective:  The patient is alert and pleasant. She has a mild headache. Her cough has resolved.  Objective: Vital signs in last 24 hours: Temp:  [98.4 F (36.9 C)] 98.4 F (36.9 C) (08/06 0501) Pulse Rate:  [71-91] 71 (08/06 0501) Resp:  [15-28] 16 (08/06 0501) BP: (106-121)/(60-78) 106/70 mmHg (08/06 0501) SpO2:  [94 %-97 %] 97 % (08/06 0501) Weight:  [65 kg (143 lb 4.8 oz)] 65 kg (143 lb 4.8 oz) (08/05 2052)  Intake/Output from previous day: 08/05 0701 - 08/06 0700 In: 1025 [I.V.:1025] Out: -  Intake/Output this shift: Total I/O In: 240 [P.O.:240] Out: -   Physical exam patient is alert and oriented. She is moving all 4 extremities well.  Lab Results:  Recent Labs  05/12/14 1617 05/13/14 0727  WBC 6.9 5.9  HGB 11.9* 12.3  HCT 37.0 38.4  PLT 323 319   BMET  Recent Labs  05/12/14 1617 05/13/14 0753  NA 142 141  K 3.5* 3.6*  CL 105 105  CO2 26 25  GLUCOSE 92 80  BUN 12 6  CREATININE 0.62 0.60  CALCIUM 8.5 8.5    Studies/Results: Dg Chest 2 View  05/12/2014   CLINICAL DATA:  Cough and congestion.  EXAM: CHEST  2 VIEW  COMPARISON:  05/11/2014  FINDINGS: Two views of this chest demonstrate a right ventricular-pleural shunt catheter. There appears to be slightly increased right pleural fluid with compressive atelectasis. The right pleural effusion is moderate-to-large in size. The left lung remains clear. Trachea is midline. Heart size is stable.  IMPRESSION: There is a moderate-to-large sized right pleural effusion which has slightly enlarged since 05/11/2014.  Right ventricular-pleural shunt.   Electronically Signed   By: Richarda Overlie M.D.   On: 05/12/2014 17:14   Dg Chest 2 View  05/11/2014   CLINICAL DATA:  Cough and decreased breath sounds on the right  EXAM: CHEST  2 VIEW  COMPARISON:  Chest radiograph December 30, 2013 and chest CT December 30, 2013  FINDINGS: There is a persistent  pleural effusion on the right which may be slightly increased. There is atelectasis and possible consolidation in the right middle and lower lobes. The left lung is clear. Heart size and pulmonary vascularity are normal. No adenopathy. There is a shunt catheter on the right which appears coiled in the right hemi thorax.  IMPRESSION: Shunt catheter appears coiled in the right hemithorax. The sizable effusion on the right may at least in part be due to fluid collecting in the right chest from the shunt catheter. There is probable compressive atelectasis in the right middle and lower lobes. Left lung is clear.  These results will be called to the ordering clinician or representative by the Radiologist Assistant, and communication documented in the PACS or zVision Dashboard.   Electronically Signed   By: Bretta Bang M.D.   On: 05/11/2014 16:38   Dg Abd 1 View  05/12/2014   CLINICAL DATA:  Cough and congestion, fever, VP shunt  EXAM: ABDOMEN - 1 VIEW  COMPARISON:  None.  FINDINGS: The bowel gas pattern is normal. No radio-opaque calculi or other significant radiographic abnormality are seen. Mild stool volume. No VP shunt tubing is identified. IUD in place over the pelvis.  IMPRESSION: Negative.   Electronically Signed   By: Christiana Pellant M.D.   On: 05/12/2014 17:12   Ct Head Wo Contrast  05/12/2014  CLINICAL DATA:  Headache.  History of VP shunt.  EXAM: CT HEAD WITHOUT CONTRAST  TECHNIQUE: Contiguous axial images were obtained from the base of the skull through the vertex without intravenous contrast.  COMPARISON:  12/04/2013  FINDINGS: Stable position of the ventriculostomy catheter. Maximal Min ventricular diameter is stable at 4.2 cm. Stable prominent temporal horns. No extra-axial fluid collections are identified. The gray-white differentiation is maintained. No findings for hemispheric infarction an or intracranial hemorrhage. The brainstem and cerebellum are grossly normal and stable.  No significant  bony findings.  IMPRESSION: Stable ventriculostomy shunt catheter.  Stable appearance and size of the ventricles. No mass effect or shift.  No acute intracranial findings.   Electronically Signed   By: Loralie ChampagneMark  Gallerani M.D.   On: 05/12/2014 17:01   Ct Chest W Contrast  05/12/2014   CLINICAL DATA:  Progressive right pleural effusion. Ventricular shunt  EXAM: CT CHEST WITH CONTRAST  TECHNIQUE: Multidetector CT imaging of the chest was performed during intravenous contrast administration.  CONTRAST:  75mL OMNIPAQUE IOHEXOL 300 MG/ML  SOLN  COMPARISON:  CT chest 12/30/2013  FINDINGS: Ventricular pleural shunt is present with the shunt tubing terminating in the pleural space on the right posteriorly, unchanged from the prior study. Moderate right pleural effusion has progressed. There is increased loculated fluid anteriorly in the base on the right. Progression of right lower lobe atelectasis.  Negative for pleural effusion on the left. No mass or adenopathy is detected.  The aortic arch is normal.  Pulmonary arteries opacified normally.  IMPRESSION: Mild increase in right pleural effusion with a loculated component anteriorly. Increase in right lower lobe compressive atelectasis.  Left lung remains clear.   Electronically Signed   By: Marlan Palauharles  Clark M.D.   On: 05/12/2014 21:02   Dg Chest Port 1 View  05/12/2014   CLINICAL DATA:  Post thoracentesis  EXAM: PORTABLE CHEST - 1 VIEW  COMPARISON:  05/12/2014  FINDINGS: There is small to moderate right pleural effusion with right basilar atelectasis or infiltrate. No pulmonary edema. No pneumothorax. Right ventricular pleural catheter is stable in position.  IMPRESSION: Small to moderate right pleural effusion with right basilar atelectasis or infiltrate. No pulmonary edema. No pneumothorax.   Electronically Signed   By: Natasha MeadLiviu  Pop M.D.   On: 05/12/2014 20:01    Assessment/Plan: Right pleural effusion with a ventricular pleural shunt: I suspect the patient may have a  infection. I will asked ID to see the patient and await the cultures. If it turns out she has an infection we may need to externalize the shunt and clear her infection and replaced the shunt some point in the future.  LOS: 1 day     Syanne Looney D 05/13/2014, 12:54 PM

## 2014-05-13 NOTE — Consult Note (Signed)
Staff note   - Likely right pleural fluid is related to VP shunt that is reported to terminate in opleural space. However, CSF fluid is in pleural space is typically low protein and is transudate. This fluid is exudate - csf  Is possible as an exudate if secondarily infected which might be possible given her headache complaints. Have asked lab to do a beta 2 transferrin on pre-existing pleural fluid collection; if present can diagnose CSF in pleural space   Dr. Kalman ShanMurali Whittney Steenson, M.D., [facility].P Pulmonary and Critical Care Medicine Staff Physician [facility] Pager: [phone], If no answer or between  15:00h - 7:00h: call [phone]  05/13/2014 12:20 PM

## 2014-05-13 NOTE — Consult Note (Signed)
[facility]    Date of Admission:  05/12/2014   Total days of antibiotics 0               Reason for Consult: Exudative pleural effusion w/ ventriculopleural shunt    Referring Physician: Dr. Onalee Hua Tat Primary Care Physician: none  Active Problems:   Pleural effusion, right   Fever   Headache   . heparin  5,000 Units Subcutaneous 3 times per day  . senna  1 tablet Oral BID  . sodium chloride  3 mL Intravenous Q12H    Recommendations: 1. Hold antibiotic therapy for now pending pleural fluid cultures  Assessment: 38 y/o F w/ h/o hydrocephalus w/ ventriculopleural shunt (revised 11/2013), presented to ED on 05/12/14 for complaints of headache, cough and subjective fever at home. On admission, CXR performed showed significant right-sided pleural effusion confirmed by CT chest. This was previously seen on CT in 12/2013, however, it is increased in size w/ a new loculated anterior component. PCCM was consulted, performed a thoracentesis, yielding an exudative effusion. Given recent worsening in symptoms and exudative nature of pleural fluid, this is very likely an infected ventriculopleural shunt. However, given the generally sub-acute nature of symptoms and very stable appearance of the patient, we will hold off on antibiotic therapy at this point pending culture results. Pleural fluid and blood cultures are cultures are negative at this time, no leukocytosis or fever noted. Discussed w/ Dr. Lovell Sheehan, may need to remove or externalize shunt in the future vs LP may be necessary to rule out CNS infection.   HPI: ARRIAH WADLE is a 38 y.o. female w/ PMHx of hydrocephalus w/ VP shunt placed originally in 2001, presented to the ED w/ complaints of worsening cough, headaches, nausea, and vomiting. The patient claims she has chronic headaches and cough, however over the past several days, her symptoms have worsened. She claims her headaches are generally diffuse  in nature, throbbing, and severe, much different than her chronic headache. Her cough she claims is very dry, not productive of any sputum. She also admits to subjective fever and chills at home un Sunday, 05/09/14. She says she thinks the vomiting and nausea is more post-tussive in nature given the significant worsening of her cough.  Review of Systems: General: Positive for fatigue, fever, and chills. Denies diaphoresis, appetite change.  Respiratory: Positive for dry cough. Denies SOB, chest tightness, and wheezing.   Cardiovascular: Positive for mild chest pain. Denies palpitations.  Gastrointestinal: Positive for nausea and vomiting. Denies abdominal pain, diarrhea. Genitourinary: Denies dysuria and flank pain. Skin: Denies pallor, rash and wounds.  Neurological: Positive for headaches. Denies dizziness, weakness, lightheadedness.  Psychiatric/Behavioral: Denies mood changes, confusion, nervousness, sleep disturbance and agitation.  Past Medical History  Diagnosis Date  . VP (ventriculoperitoneal) shunt status   . Chronic headaches   . Chronic cough   . 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 Ventriculopleural shunt status     History  Substance Use Topics  . Smoking status: Never Smoker   . Smokeless tobacco: Never Used  . Alcohol Use: No    Family History  Problem Relation Age of Onset  . Diabetes Father    No Known Allergies  OBJECTIVE: Blood pressure 100/65, pulse 81, temperature 97.8 F (36.6 C), temperature source Oral, resp. rate 18, height 5\' 6"  (1.676 m), weight 143 lb 4.8 oz (65 kg), last menstrual period  04/27/2014, SpO2 95.00%.  Physical Exam: General: Alert, cooperative, no acute distress, non-toxic appearing. HEENT: PERRL, EOMI. Moist mucus membranes. VP shunt palpated on right posterior scalp tracking down right side of neck. No overlying tenderness.  Neck: Full range of motion without pain, supple. Lungs: Decreased breath sounds on right  base. Otherwise no wheezes or rales.  Heart: Regular rate and rhythm, no murmurs. Abdomen: Soft, non-tender, non-distended, bowel sounds present. Extremities: No cyanosis, clubbing, or edema Neurologic: Alert & oriented X3, cranial nerves II-XII intact, strength grossly intact, sensation intact to light touch  Lab Results Lab Results  Component Value Date   WBC 5.9 05/13/2014   HGB 12.3 05/13/2014   HCT 38.4 05/13/2014   MCV 85.7 05/13/2014   PLT 319 05/13/2014    Lab Results  Component Value Date   CREATININE 0.60 05/13/2014   BUN 6 05/13/2014   NA 141 05/13/2014   K 3.6* 05/13/2014   CL 105 05/13/2014   CO2 25 05/13/2014    Lab Results  Component Value Date   ALT 14 05/13/2014   AST 13 05/13/2014   ALKPHOS 79 05/13/2014   BILITOT 0.9 05/13/2014     Microbiology: Recent Results (from the past 240 hour(s))  BODY FLUID CULTURE     Status: None   Collection Time    05/12/14  7:50 PM      Result Value Ref Range Status   Specimen Description PLEURAL FLUID RIGHT   Final   Special Requests NONE   Final   Gram Stain     Final   Value: RARE WBC PRESENT,BOTH PMN AND MONONUCLEAR     NO ORGANISMS SEEN     Performed at [facility]   Culture     Final   Value: NO GROWTH     Performed at [facility]   Report Status PENDING   Incomplete    Lars MassonJones, [place], MD PGY-2, Internal Medicine Pager: [phone] 05/13/2014, 1:14 PM  Attending addendum: I have seen and examined Ms. Highfill and discussed O. with Dr. Yetta BarreJones and Dr. Lovell SheehanJenkins. She describes progressively worsening headache, dry cough and shortness of breath since revision of her peritoneal shunt in February. She was admitted with enlarging right-sided pleural effusion. 1500 cc of clear pleural fluid was aspirated yesterday. It is exudative with a lymphocytic predominance. No organisms were seen on stain. She is not febrile, toxic-appearing or clinically unstable so I would favor observation off of antibiotics since we may need further  diagnostic procedures to confirm the presence of infection and a specific pathogen. She may need to have the pleural end of the shunt externalized or have the shunt removed.  Cliffton AstersJohn Jamiaya Bina, MD [facility] [phone] pager   [phone] cell 05/13/2014, 5:27 PM

## 2014-05-13 NOTE — Progress Notes (Signed)
Utilization review completed.  

## 2014-05-13 NOTE — Consult Note (Signed)
Name: [name] MRN: [id number] DOB: Mar 15, 1976    ADMISSION DATE:  05/12/2014 CONSULTATION DATE:  05/12/2014  REFERRING MD :  Samuel Jester  CHIEF COMPLAINT:  Headache  BRIEF PATIENT DESCRIPTION: 38 yo female with hx of VP shunt presented with headache and dry cough.  Found to have large Rt pleural effusion.  PCCM consulted to assist with pleural effusion evaluation.  SIGNIFICANT EVENTS: 8/05 Admit for headache, R effusion  STUDIES:  8/05 Rt pleural fluid >>exudative     SUBJECTIVE:  Pt refused to get off phone for exam / interview.   VITAL SIGNS: Temp:  [98.4 F (36.9 C)] 98.4 F (36.9 C) (08/06 0501) Pulse Rate:  [71-91] 71 (08/06 0501) Resp:  [15-28] 16 (08/06 0501) BP: (106-121)/(60-78) 106/70 mmHg (08/06 0501) SpO2:  [94 %-97 %] 97 % (08/06 0501) Weight:  [143 lb 4.8 oz (65 kg)] 143 lb 4.8 oz (65 kg) (08/05 2052)  PHYSICAL EXAMINATION: General: no distress, sitting in bed Neuro:  Alert, normal strength, CN intact HEENT:  Pupils reactive, no oral exudate, no LAN Cardiovascular: regular, no murmur Lungs:  Decreased breath sounds 1/2 up Rt lung field Abdomen:  Soft, non tender, normal bowel sounds Musculoskeletal:  No edema Skin:  No rashes  CBC Recent Labs     05/11/14  1627  05/12/14  1617  05/13/14  0727  WBC  8.2  6.9  5.9  HGB  12.8  11.9*  12.3  HCT  38.9  37.0  38.4  PLT   --   323  319   BMET Recent Labs     05/12/14  1617  05/13/14  0753  NA  142  141  K  3.5*  3.6*  CL  105  105  CO2  26  25  BUN  12  6  CREATININE  0.62  0.60  GLUCOSE  92  80    Electrolytes Recent Labs     05/12/14  1617  05/13/14  0753  CALCIUM  8.5  8.5    Imaging Dg Chest 2 View  05/12/2014   CLINICAL DATA:  Cough and congestion.  EXAM: CHEST  2 VIEW  COMPARISON:  05/11/2014  FINDINGS: Two views of this chest demonstrate a right ventricular-pleural shunt catheter. There appears to be slightly increased right pleural fluid with compressive  atelectasis. The right pleural effusion is moderate-to-large in size. The left lung remains clear. Trachea is midline. Heart size is stable.  IMPRESSION: There is a moderate-to-large sized right pleural effusion which has slightly enlarged since 05/11/2014.  Right ventricular-pleural shunt.   Electronically Signed   By: Richarda Overlie M.D.   On: 05/12/2014 17:14   Dg Chest 2 View  05/11/2014   CLINICAL DATA:  Cough and decreased breath sounds on the right  EXAM: CHEST  2 VIEW  COMPARISON:  Chest radiograph December 30, 2013 and chest CT December 30, 2013  FINDINGS: There is a persistent pleural effusion on the right which may be slightly increased. There is atelectasis and possible consolidation in the right middle and lower lobes. The left lung is clear. Heart size and pulmonary vascularity are normal. No adenopathy. There is a shunt catheter on the right which appears coiled in the right hemi thorax.  IMPRESSION: Shunt catheter appears coiled in the right hemithorax. The sizable effusion on the right may at least in part be due to fluid collecting in the right chest from the shunt catheter. There is probable compressive atelectasis in the right  middle and lower lobes. Left lung is clear.  These results will be called to the ordering clinician or representative by the Radiologist Assistant, and communication documented in the PACS or zVision Dashboard.   Electronically Signed   By: Bretta Bang M.D.   On: 05/11/2014 16:38   Dg Abd 1 View  05/12/2014   CLINICAL DATA:  Cough and congestion, fever, VP shunt  EXAM: ABDOMEN - 1 VIEW  COMPARISON:  None.  FINDINGS: The bowel gas pattern is normal. No radio-opaque calculi or other significant radiographic abnormality are seen. Mild stool volume. No VP shunt tubing is identified. IUD in place over the pelvis.  IMPRESSION: Negative.   Electronically Signed   By: Christiana Pellant M.D.   On: 05/12/2014 17:12   Ct Head Wo Contrast  05/12/2014   CLINICAL DATA:  Headache.   History of VP shunt.  EXAM: CT HEAD WITHOUT CONTRAST  TECHNIQUE: Contiguous axial images were obtained from the base of the skull through the vertex without intravenous contrast.  COMPARISON:  12/04/2013  FINDINGS: Stable position of the ventriculostomy catheter. Maximal Min ventricular diameter is stable at 4.2 cm. Stable prominent temporal horns. No extra-axial fluid collections are identified. The gray-white differentiation is maintained. No findings for hemispheric infarction an or intracranial hemorrhage. The brainstem and cerebellum are grossly normal and stable.  No significant bony findings.  IMPRESSION: Stable ventriculostomy shunt catheter.  Stable appearance and size of the ventricles. No mass effect or shift.  No acute intracranial findings.   Electronically Signed   By: Loralie Champagne M.D.   On: 05/12/2014 17:01   Ct Chest W Contrast  05/12/2014   CLINICAL DATA:  Progressive right pleural effusion. Ventricular shunt  EXAM: CT CHEST WITH CONTRAST  TECHNIQUE: Multidetector CT imaging of the chest was performed during intravenous contrast administration.  CONTRAST:  75mL OMNIPAQUE IOHEXOL 300 MG/ML  SOLN  COMPARISON:  CT chest 12/30/2013  FINDINGS: Ventricular pleural shunt is present with the shunt tubing terminating in the pleural space on the right posteriorly, unchanged from the prior study. Moderate right pleural effusion has progressed. There is increased loculated fluid anteriorly in the base on the right. Progression of right lower lobe atelectasis.  Negative for pleural effusion on the left. No mass or adenopathy is detected.  The aortic arch is normal.  Pulmonary arteries opacified normally.  IMPRESSION: Mild increase in right pleural effusion with a loculated component anteriorly. Increase in right lower lobe compressive atelectasis.  Left lung remains clear.   Electronically Signed   By: Marlan Palau M.D.   On: 05/12/2014 21:02   Dg Chest Port 1 View  05/12/2014   CLINICAL DATA:  Post  thoracentesis  EXAM: PORTABLE CHEST - 1 VIEW  COMPARISON:  05/12/2014  FINDINGS: There is small to moderate right pleural effusion with right basilar atelectasis or infiltrate. No pulmonary edema. No pneumothorax. Right ventricular pleural catheter is stable in position.  IMPRESSION: Small to moderate right pleural effusion with right basilar atelectasis or infiltrate. No pulmonary edema. No pneumothorax.   Electronically Signed   By: Natasha Mead M.D.   On: 05/12/2014 20:01       ASSESSMENT / PLAN:  38 y/o female with large Rt pleural effusion in setting of VP shunt draining into rt pleural space.  She has fairly recent onset of fever, headache, nausea/vomiting.  Rt exudative pleural effusion. Plan: Fluid consistent with exudate Follow fluid culture, narrow abx accordingly  VP shunt. Plan:  She will need evaluation by  neurosurgery >> consult called by ER physician  Fever. Plan: Defer Abx to [facility]    Patient would not get off phone for interview / exam.  Pleural fluid consistent with exudative process.  Await cultures.  PCCM will be available PRN.    Canary BrimBrandi Izaiyah Kleinman, NP-C [facility] Pgr: [phone] or [phone]  05/13/2014, 10:45 AM

## 2014-05-14 ENCOUNTER — Inpatient Hospital Stay (HOSPITAL_COMMUNITY): Payer: Medicaid Other

## 2014-05-14 DIAGNOSIS — R0602 Shortness of breath: Secondary | ICD-10-CM

## 2014-05-14 DIAGNOSIS — D72829 Elevated white blood cell count, unspecified: Secondary | ICD-10-CM

## 2014-05-14 LAB — GRAM STAIN

## 2014-05-14 LAB — CBC
HCT: 40.3 % (ref 36.0–46.0)
Hemoglobin: 13.1 g/dL (ref 12.0–15.0)
MCH: 27.8 pg (ref 26.0–34.0)
MCHC: 32.5 g/dL (ref 30.0–36.0)
MCV: 85.4 fL (ref 78.0–100.0)
Platelets: 344 10*3/uL (ref 150–400)
RBC: 4.72 MIL/uL (ref 3.87–5.11)
RDW: 13.4 % (ref 11.5–15.5)
WBC: 10.6 10*3/uL — ABNORMAL HIGH (ref 4.0–10.5)

## 2014-05-14 LAB — CSF CELL COUNT WITH DIFFERENTIAL
RBC COUNT CSF: 0 /mm3
Tube #: 1
WBC CSF: 1 /mm3 (ref 0–5)

## 2014-05-14 LAB — PROTEIN AND GLUCOSE, CSF
GLUCOSE CSF: 75 mg/dL (ref 43–76)
Total  Protein, CSF: 6 mg/dL — ABNORMAL LOW (ref 15–45)

## 2014-05-14 MED ORDER — SODIUM CHLORIDE 0.9 % IV BOLUS (SEPSIS)
250.0000 mL | Freq: Once | INTRAVENOUS | Status: AC
  Administered 2014-05-14: 250 mL via INTRAVENOUS

## 2014-05-14 NOTE — Progress Notes (Signed)
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 Orvan Falconer followup -Hold abx per ID  -Fluid suggestive of exudate  -Gram stain and culture negative of pleural fluid at 48 hours  -Blood cultures negative at 48 hours  -05/12/2014 thoracocentesis--performed by Dr. Craige Cotta  -Case was discussed with Dr. Little Ishikawa for possible shunt tap -Even if cultures are negative she may still need externalization of the shunt with shunt cultures before deciding on antimicrobial management.  Headache  -resolved -Likely related to ventriculopleural shunt  -Headache improved after thoracocentesis  -CT brain shows no acute findings  -Appreciate neurosurgery consultation     Family Communication:   Husband updated, family at bedside;  Total time 35 min with >50% spent counseling patient and coordinating care Disposition Plan:   Home when medically stable       Procedures/Studies: Dg Chest 2 View  05/12/2014   CLINICAL DATA:  Cough and congestion.  EXAM: CHEST  2 VIEW  COMPARISON:  05/11/2014  FINDINGS: Two views of this chest demonstrate a right ventricular-pleural shunt catheter. There appears to be slightly increased right pleural fluid with compressive atelectasis. The right pleural effusion is moderate-to-large in size. The left lung remains clear. Trachea is midline. Heart size is stable.  IMPRESSION: There is a moderate-to-large sized right pleural effusion which has slightly enlarged since 05/11/2014.  Right ventricular-pleural shunt.   Electronically Signed   By: Richarda Overlie M.D.   On: 05/12/2014 17:14   Dg Chest 2 View  05/11/2014   CLINICAL DATA:  Cough and decreased breath sounds on the right   EXAM: CHEST  2 VIEW  COMPARISON:  Chest radiograph December 30, 2013 and chest CT December 30, 2013  FINDINGS: There is a persistent pleural effusion on the right which may be slightly increased. There is atelectasis and possible consolidation in the right middle and lower lobes. The left lung is clear. Heart size and pulmonary vascularity are normal. No adenopathy. There is a shunt catheter on the right which appears coiled in the right hemi thorax.  IMPRESSION: Shunt catheter appears coiled in the right hemithorax. The sizable effusion on the right may at least in part be due to fluid collecting in the right chest from the shunt catheter. There is probable compressive atelectasis in the right middle and lower lobes. Left lung is clear.  These results will be called to the ordering clinician or representative by the Radiologist Assistant, and communication documented in the PACS or zVision Dashboard.   Electronically Signed   By: Bretta Bang M.D.   On: 05/11/2014 16:38   Dg Abd 1 View  05/12/2014   CLINICAL DATA:  Cough and congestion, fever, VP shunt  EXAM: ABDOMEN - 1 VIEW  COMPARISON:  None.  FINDINGS: The bowel gas pattern is normal. No radio-opaque calculi or other significant radiographic abnormality are seen. Mild stool volume. No VP shunt tubing is identified. IUD in place over the pelvis.  IMPRESSION: Negative.   Electronically Signed   By: Christiana Pellant M.D.   On: 05/12/2014 17:12   Ct Head Wo Contrast  05/12/2014   CLINICAL DATA:  Headache.  History of VP shunt.  EXAM: CT HEAD WITHOUT CONTRAST  TECHNIQUE: Contiguous axial images were  obtained from the base of the skull through the vertex without intravenous contrast.  COMPARISON:  12/04/2013  FINDINGS: Stable position of the ventriculostomy catheter. Maximal Min ventricular diameter is stable at 4.2 cm. Stable prominent temporal horns. No extra-axial fluid collections are identified. The gray-white differentiation is maintained. No findings for  hemispheric infarction an or intracranial hemorrhage. The brainstem and cerebellum are grossly normal and stable.  No significant bony findings.  IMPRESSION: Stable ventriculostomy shunt catheter.  Stable appearance and size of the ventricles. No mass effect or shift.  No acute intracranial findings.   Electronically Signed   By: Loralie Champagne M.D.   On: 05/12/2014 17:01   Ct Chest W Contrast  05/12/2014   CLINICAL DATA:  Progressive right pleural effusion. Ventricular shunt  EXAM: CT CHEST WITH CONTRAST  TECHNIQUE: Multidetector CT imaging of the chest was performed during intravenous contrast administration.  CONTRAST:  75mL OMNIPAQUE IOHEXOL 300 MG/ML  SOLN  COMPARISON:  CT chest 12/30/2013  FINDINGS: Ventricular pleural shunt is present with the shunt tubing terminating in the pleural space on the right posteriorly, unchanged from the prior study. Moderate right pleural effusion has progressed. There is increased loculated fluid anteriorly in the base on the right. Progression of right lower lobe atelectasis.  Negative for pleural effusion on the left. No mass or adenopathy is detected.  The aortic arch is normal.  Pulmonary arteries opacified normally.  IMPRESSION: Mild increase in right pleural effusion with a loculated component anteriorly. Increase in right lower lobe compressive atelectasis.  Left lung remains clear.   Electronically Signed   By: Marlan Palau M.D.   On: 05/12/2014 21:02   Dg Chest Port 1 View  05/12/2014   CLINICAL DATA:  Post thoracentesis  EXAM: PORTABLE CHEST - 1 VIEW  COMPARISON:  05/12/2014  FINDINGS: There is small to moderate right pleural effusion with right basilar atelectasis or infiltrate. No pulmonary edema. No pneumothorax. Right ventricular pleural catheter is stable in position.  IMPRESSION: Small to moderate right pleural effusion with right basilar atelectasis or infiltrate. No pulmonary edema. No pneumothorax.   Electronically Signed   By: Natasha Mead M.D.   On:  05/12/2014 20:01         Subjective: Patient is feeling better today.             PROGRESS NOTE  [name] YQM:[id number] DOB: 11-28-1975 DOA: 05/12/2014 PCP: Pcp Not In System  Assessment/Plan:   Active Problems:   Pleural effusion, right   Fever   Headache       Procedures/Studies: Dg Chest 2 View  05/12/2014   CLINICAL DATA:  Cough and congestion.  EXAM: CHEST  2 VIEW  COMPARISON:  05/11/2014  FINDINGS: Two views of this chest demonstrate a right ventricular-pleural shunt catheter. There appears to be slightly increased right pleural fluid with compressive atelectasis. The right pleural effusion is moderate-to-large in size. The left lung remains clear. Trachea is midline. Heart size is stable.  IMPRESSION: There is a moderate-to-large sized right pleural effusion which has slightly enlarged since 05/11/2014.  Right ventricular-pleural shunt.   Electronically Signed   By: Richarda Overlie M.D.   On: 05/12/2014 17:14   Dg Chest 2 View  05/11/2014   CLINICAL DATA:  Cough and decreased breath sounds on the right  EXAM: CHEST  2 VIEW  COMPARISON:  Chest radiograph December 30, 2013 and chest CT December 30, 2013  FINDINGS: There is a persistent pleural effusion on the right which may be  slightly increased. There is atelectasis and possible consolidation in the right middle and lower lobes. The left lung is clear. Heart size and pulmonary vascularity are normal. No adenopathy. There is a shunt catheter on the right which appears coiled in the right hemi thorax.  IMPRESSION: Shunt catheter appears coiled in the right hemithorax. The sizable effusion on the right may at least in part be due to fluid collecting in the right chest from the shunt catheter. There is probable compressive atelectasis in the right middle and lower lobes. Left lung is clear.  These results will be called to the ordering clinician or representative by the Radiologist Assistant, and communication documented in the PACS  or zVision Dashboard.   Electronically Signed   By: Bretta Bang M.D.   On: 05/11/2014 16:38   Dg Abd 1 View  05/12/2014   CLINICAL DATA:  Cough and congestion, fever, VP shunt  EXAM: ABDOMEN - 1 VIEW  COMPARISON:  None.  FINDINGS: The bowel gas pattern is normal. No radio-opaque calculi or other significant radiographic abnormality are seen. Mild stool volume. No VP shunt tubing is identified. IUD in place over the pelvis.  IMPRESSION: Negative.   Electronically Signed   By: Christiana Pellant M.D.   On: 05/12/2014 17:12   Ct Head Wo Contrast  05/12/2014   CLINICAL DATA:  Headache.  History of VP shunt.  EXAM: CT HEAD WITHOUT CONTRAST  TECHNIQUE: Contiguous axial images were obtained from the base of the skull through the vertex without intravenous contrast.  COMPARISON:  12/04/2013  FINDINGS: Stable position of the ventriculostomy catheter. Maximal Min ventricular diameter is stable at 4.2 cm. Stable prominent temporal horns. No extra-axial fluid collections are identified. The gray-white differentiation is maintained. No findings for hemispheric infarction an or intracranial hemorrhage. The brainstem and cerebellum are grossly normal and stable.  No significant bony findings.  IMPRESSION: Stable ventriculostomy shunt catheter.  Stable appearance and size of the ventricles. No mass effect or shift.  No acute intracranial findings.   Electronically Signed   By: Loralie Champagne M.D.   On: 05/12/2014 17:01   Ct Chest W Contrast  05/12/2014   CLINICAL DATA:  Progressive right pleural effusion. Ventricular shunt  EXAM: CT CHEST WITH CONTRAST  TECHNIQUE: Multidetector CT imaging of the chest was performed during intravenous contrast administration.  CONTRAST:  75mL OMNIPAQUE IOHEXOL 300 MG/ML  SOLN  COMPARISON:  CT chest 12/30/2013  FINDINGS: Ventricular pleural shunt is present with the shunt tubing terminating in the pleural space on the right posteriorly, unchanged from the prior study. Moderate right pleural  effusion has progressed. There is increased loculated fluid anteriorly in the base on the right. Progression of right lower lobe atelectasis.  Negative for pleural effusion on the left. No mass or adenopathy is detected.  The aortic arch is normal.  Pulmonary arteries opacified normally.  IMPRESSION: Mild increase in right pleural effusion with a loculated component anteriorly. Increase in right lower lobe compressive atelectasis.  Left lung remains clear.   Electronically Signed   By: Marlan Palau M.D.   On: 05/12/2014 21:02   Dg Chest Port 1 View  05/12/2014   CLINICAL DATA:  Post thoracentesis  EXAM: PORTABLE CHEST - 1 VIEW  COMPARISON:  05/12/2014  FINDINGS: There is small to moderate right pleural effusion with right basilar atelectasis or infiltrate. No pulmonary edema. No pneumothorax. Right ventricular pleural catheter is stable in position.  IMPRESSION: Small to moderate right pleural effusion with right basilar atelectasis  or infiltrate. No pulmonary edema. No pneumothorax.   Electronically Signed   By: Natasha Mead M.D.   On: 05/12/2014 20:01         Subjective:   Objective: Filed Vitals:   05/13/14 0501 05/13/14 1306 05/13/14 1955 05/14/14 0456  BP: 106/70 100/65 104/59 87/47  Pulse: 71 81 97 92  Temp: 98.4 F (36.9 C) 97.8 F (36.6 C) 98.7 F (37.1 C) 98.8 F (37.1 C)  TempSrc: Oral Oral Oral Oral  Resp: 16 18 15 12   Height:      Weight:      SpO2: 97% 95% 97% 94%    Intake/Output Summary (Last 24 hours) at 05/14/14 1334 Last data filed at 05/13/14 2033  Gross per 24 hour  Intake      3 ml  Output      0 ml  Net      3 ml   Weight change:  Exam:   General:  Pt is alert, follows commands appropriately, not in acute distress  HEENT: No icterus, No thrush, No neck mass, /AT  Cardiovascular: RRR, S1/S2, no rubs, no gallops  Respiratory: CTA bilaterally, no wheezing, no crackles, no rhonchi  Abdomen: Soft/+BS, non tender, non distended, no  guarding  Extremities: No edema, No lymphangitis, No petechiae, No rashes, no synovitis  Data Reviewed: Basic Metabolic Panel:  Recent Labs Lab 05/12/14 1617 05/13/14 0753  NA 142 141  K 3.5* 3.6*  CL 105 105  CO2 26 25  GLUCOSE 92 80  BUN 12 6  CREATININE 0.62 0.60  CALCIUM 8.5 8.5   Liver Function Tests:  Recent Labs Lab 05/13/14 0753  AST 13  ALT 14  ALKPHOS 79  BILITOT 0.9  PROT 6.7  ALBUMIN 2.7*   No results found for this basename: LIPASE, AMYLASE,  in the last 168 hours No results found for this basename: AMMONIA,  in the last 168 hours CBC:  Recent Labs Lab 05/11/14 1627 05/12/14 1617 05/13/14 0727 05/14/14 0529  WBC 8.2 6.9 5.9 10.6*  NEUTROABS  --  4.6  --   --   HGB 12.8 11.9* 12.3 13.1  HCT 38.9 37.0 38.4 40.3  MCV 83.9 85.3 85.7 85.4  PLT  --  323 319 344   Cardiac Enzymes: No results found for this basename: CKTOTAL, CKMB, CKMBINDEX, TROPONINI,  in the last 168 hours BNP: No components found with this basename: POCBNP,  CBG: No results found for this basename: GLUCAP,  in the last 168 hours  Recent Results (from the past 240 hour(s))  BODY FLUID CULTURE     Status: None   Collection Time    05/12/14  7:50 PM      Result Value Ref Range Status   Specimen Description PLEURAL FLUID RIGHT   Final   Special Requests NONE   Final   Gram Stain     Final   Value: RARE WBC PRESENT,BOTH PMN AND MONONUCLEAR     NO ORGANISMS SEEN     Performed at [facility]   Culture     Final   Value: NO GROWTH 1 DAY     Performed at [facility]   Report Status PENDING   Incomplete  AFB CULTURE WITH SMEAR     Status: None   Collection Time    05/12/14  7:50 PM      Result Value Ref Range Status   Specimen Description PLEURAL FLUID RIGHT   Final   Special Requests NONE  Final   Acid Fast Smear     Final   Value: NO ACID FAST BACILLI SEEN     Performed at [facility]   Culture     Final   Value: CULTURE WILL BE EXAMINED FOR  6 WEEKS BEFORE ISSUING A FINAL REPORT     Performed at [facility]   Report Status PENDING   Incomplete  FUNGUS CULTURE W SMEAR     Status: None   Collection Time    05/12/14  7:50 PM      Result Value Ref Range Status   Specimen Description PLEURAL FLUID RIGHT   Final   Special Requests NONE   Final   Fungal Smear     Final   Value: NO YEAST OR FUNGAL ELEMENTS SEEN     Performed at [facility]   Culture     Final   Value: CULTURE IN PROGRESS FOR FOUR WEEKS     Performed at [facility]   Report Status PENDING   Incomplete  CULTURE, BLOOD (ROUTINE X 2)     Status: None   Collection Time    05/12/14  8:15 PM      Result Value Ref Range Status   Specimen Description BLOOD RIGHT ARM   Final   Special Requests BOTTLES DRAWN AEROBIC AND ANAEROBIC 6CC   Final   Culture  Setup Time     Final   Value: 05/13/2014 00:24     Performed at [facility]   Culture     Final   Value:        BLOOD CULTURE RECEIVED NO GROWTH TO DATE CULTURE WILL BE HELD FOR 5 DAYS BEFORE ISSUING A FINAL NEGATIVE REPORT     Performed at [facility]   Report Status PENDING   Incomplete  CULTURE, BLOOD (ROUTINE X 2)     Status: None   Collection Time    05/12/14  8:20 PM      Result Value Ref Range Status   Specimen Description BLOOD RIGHT HAND   Final   Special Requests BOTTLES DRAWN AEROBIC ONLY 10CC   Final   Culture  Setup Time     Final   Value: 05/13/2014 00:23     Performed at [facility]   Culture     Final   Value:        BLOOD CULTURE RECEIVED NO GROWTH TO DATE CULTURE WILL BE HELD FOR 5 DAYS BEFORE ISSUING A FINAL NEGATIVE REPORT     Performed at [facility]   Report Status PENDING   Incomplete     Scheduled Meds: . heparin  5,000 Units Subcutaneous 3 times per day  . senna  1 tablet Oral BID  . sodium chloride  3 mL Intravenous Q12H   Continuous Infusions:    Calbert Hulsebus, DO  [facility] Pager [phone]  If 7PM-7AM,  please contact night-coverage [email or web address] Password [email or web address] 05/14/2014, 1:34 PM   LOS: 2 days    Objective: Filed Vitals:   05/13/14 0501 05/13/14 1306 05/13/14 1955 05/14/14 0456  BP: 106/70 100/65 104/59 87/47  Pulse: 71 81 97 92  Temp: 98.4 F (36.9 C) 97.8 F (36.6 C) 98.7 F (37.1 C) 98.8 F (37.1 C)  TempSrc: Oral Oral Oral Oral  Resp: 16 18 15 12   Height:      Weight:      SpO2: 97% 95% 97% 94%    Intake/Output Summary (Last 24  hours) at 05/14/14 1329 Last data filed at 05/13/14 2033  Gross per 24 hour  Intake      3 ml  Output      0 ml  Net      3 ml   Weight change:  Exam:   General:  Pt is alert, follows commands appropriately, not in acute distress  HEENT: No icterus, No thrush, No meningismus, [place]/AT  Cardiovascular: RRR, S1/S2, no rubs, no gallops  Respiratory: CTA bilaterally, no wheezing, no crackles, no rhonchi  Abdomen: Soft/+BS, non tender, non distended, no guarding  Extremities: No edema, No lymphangitis, No petechiae, No rashes, no synovitis  Data Reviewed: Basic Metabolic Panel:  Recent Labs Lab 05/12/14 1617 05/13/14 0753  NA 142 141  K 3.5* 3.6*  CL 105 105  CO2 26 25  GLUCOSE 92 80  BUN 12 6  CREATININE 0.62 0.60  CALCIUM 8.5 8.5   Liver Function Tests:  Recent Labs Lab 05/13/14 0753  AST 13  ALT 14  ALKPHOS 79  BILITOT 0.9  PROT 6.7  ALBUMIN 2.7*   No results found for this basename: LIPASE, AMYLASE,  in the last 168 hours No results found for this basename: AMMONIA,  in the last 168 hours CBC:  Recent Labs Lab 05/11/14 1627 05/12/14 1617 05/13/14 0727 05/14/14 0529  WBC 8.2 6.9 5.9 10.6*  NEUTROABS  --  4.6  --   --   HGB 12.8 11.9* 12.3 13.1  HCT 38.9 37.0 38.4 40.3  MCV 83.9 85.3 85.7 85.4  PLT  --  323 319 344   Cardiac Enzymes: No results found for this basename: CKTOTAL, CKMB, CKMBINDEX, TROPONINI,  in the last 168 hours BNP: No components found with this basename: POCBNP,  CBG: No results  found for this basename: GLUCAP,  in the last 168 hours  Recent Results (from the past 240 hour(s))  BODY FLUID CULTURE     Status: None   Collection Time    05/12/14  7:50 PM      Result Value Ref Range Status   Specimen Description PLEURAL FLUID RIGHT   Final   Special Requests NONE   Final   Gram Stain     Final   Value: RARE WBC PRESENT,BOTH PMN AND MONONUCLEAR     NO ORGANISMS SEEN     Performed at [facility]   Culture     Final   Value: NO GROWTH 1 DAY     Performed at [facility]   Report Status PENDING   Incomplete  AFB CULTURE WITH SMEAR     Status: None   Collection Time    05/12/14  7:50 PM      Result Value Ref Range Status   Specimen Description PLEURAL FLUID RIGHT   Final   Special Requests NONE   Final   Acid Fast Smear     Final   Value: NO ACID FAST BACILLI SEEN     Performed at [facility]   Culture     Final   Value: CULTURE WILL BE EXAMINED FOR 6 WEEKS BEFORE ISSUING A FINAL REPORT     Performed at [facility]   Report Status PENDING   Incomplete  FUNGUS CULTURE W SMEAR     Status: None   Collection Time    05/12/14  7:50 PM      Result Value Ref Range Status   Specimen Description PLEURAL FLUID RIGHT   Final   Special  Requests NONE   Final   Fungal Smear     Final   Value: NO YEAST OR FUNGAL ELEMENTS SEEN     [facility]   Culture     Final   Value: CULTURE IN PROGRESS FOR FOUR WEEKS     [facility]   Report Status PENDING   Incomplete  CULTURE, BLOOD (ROUTINE X 2)     Status: None   Collection Time    05/12/14  8:15 PM      Result Value Ref Range Status   Specimen Description BLOOD RIGHT ARM   Final   Special Requests BOTTLES DRAWN AEROBIC AND ANAEROBIC 6CC   Final   Culture  Setup Time     Final   Value: 05/13/2014 00:24     [facility]   Culture     Final   Value:        BLOOD CULTURE RECEIVED NO GROWTH TO DATE CULTURE WILL BE HELD FOR 5  DAYS BEFORE ISSUING A FINAL NEGATIVE REPORT     [facility]   Report Status PENDING   Incomplete  CULTURE, BLOOD (ROUTINE X 2)     Status: None   Collection Time    05/12/14  8:20 PM      Result Value Ref Range Status   Specimen Description BLOOD RIGHT HAND   Final   Special Requests BOTTLES DRAWN AEROBIC ONLY 10CC   Final   Culture  Setup Time     Final   Value: 05/13/2014 00:23     [facility]   Culture     Final   Value:        BLOOD CULTURE RECEIVED NO GROWTH TO DATE CULTURE WILL BE HELD FOR 5 DAYS BEFORE ISSUING A FINAL NEGATIVE REPORT     [facility]   Report Status PENDING   Incomplete     Scheduled Meds: . heparin  5,000 Units Subcutaneous 3 times per day  . senna  1 tablet Oral BID  . sodium chloride  3 mL Intravenous Q12H   Continuous Infusions:    Albie Arizpe, DO  [facility] Pager [phone]  If 7PM-7AM, please contact night-coverage [email or web address] Password [email or web address] 05/14/2014, 1:29 PM   LOS: 2 days

## 2014-05-14 NOTE — Progress Notes (Signed)
After the shunt tap patient complains of an increase in non-productive cough. Bertram DenverMckinney, Zoii Florer D, RN

## 2014-05-14 NOTE — Op Note (Signed)
Brief history: The patient is a [age] year old [place] who had a shunt placed by Dr. Venetia MaxonStern in 2001. This was complicated by enterococcus meningitis. The patient has had multiple shunt revisions and eventually conversion to a right ventricular to pleural shunt. Most recently, i.e. in February of 2015 the patient had a proximal shunt malfunction. I perform surgery to replace her ventricular catheter. The patient has had a persistent right pleural effusion. This was watched as an outpatient with serial x-rays which were stable. Patient has developed a progressive cough and was admitted. A CT and chest x-ray demonstrated an enlarging right pleural effusion. She underwent thoracocentesis demonstrated abundant white blood cells. Cultures are currently pending.  I suspect a shunt infection and possible partial malfunction. I recommended a shunt tap. I explained the procedure to the patient and her family. We discussed the risks, benefits, and alternatives I've answered all her questions. She has decided proceed with a shunt tap.  Preop diagnosis: Hospital shunt malfunction, possible shunt infection  Postop diagnosis: The same  Procedure: Right retroauricular shunt tap  Surgeon: Dr. Delma OfficerJeff Lilliam Chamblee  Asst.: None  Anesthesia: None  Specimens: Spinal fluid  Complications: None  Description of procedure: The right retroauricular scalp was shaved with clippers and prepared with DuraPrep. Sterile drapes were applied. Using aseptic technique I inserted a 25-gauge butterfly needle. It easily aspirated spinal fluid which was crystal clear. I removed the needle. We sent off spinal fluid. I applied a Band-Aid to the tap site. The patient tolerated procedure well.

## 2014-05-14 NOTE — Progress Notes (Signed)
Beta 2 transferring not back yet. PCCM will see again 05/17/14 Monday  Dr. Kalman ShanMurali Kimberlee Shoun, M.D., [facility]P Pulmonary and Critical Care Medicine Staff Physician [facility] Pager: [phone], If no answer or between  15:00h - 7:00h: call [phone]  05/14/2014 10:22 AM

## 2014-05-14 NOTE — Progress Notes (Signed)
Dr.Tat notified per family request in regards to increase cough. Bertram DenverMckinney, Levii Hairfield D, RN

## 2014-05-14 NOTE — Progress Notes (Signed)
[facility]    Date of Admission:  05/12/2014   Total days of antibiotics 0         Active Problems:   Pleural effusion, right   Fever   Headache   . heparin  5,000 Units Subcutaneous 3 times per day  . senna  1 tablet Oral BID  . sodium chloride  3 mL Intravenous Q12H    Subjective: No new complaints, says she is feeling improved today. No fever overnight, cough is decreased and headaches somewhat improved as well.    Past Medical History  Diagnosis Date  . VP (ventriculoperitoneal) shunt status   . Chronic headaches   . Chronic cough   . 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 Ventriculopleural shunt status     History  Substance Use Topics  . Smoking status: Never Smoker   . Smokeless tobacco: Never Used  . Alcohol Use: No    Family History  Problem Relation Age of Onset  . Diabetes Father     No Known Allergies  Objective: Temp:  [97.8 F (36.6 C)-98.8 F (37.1 C)] 98.8 F (37.1 C) (08/07 0456) Pulse Rate:  [81-97] 92 (08/07 0456) Resp:  [12-18] 12 (08/07 0456) BP: (87-104)/(47-65) 87/47 mmHg (08/07 0456) SpO2:  [94 %-97 %] 94 % (08/07 0456)  Physical Exam: General: Alert, cooperative, no acute distress, non-toxic appearing. HEENT: PERRL, EOMI. VP shunt palpated on right posterior scalp tracking down right side of neck. No overlying tenderness.  Neck: Full range of motion without pain, supple. Lungs: Decreased breath sounds on right base. Otherwise no wheezes or rales.  Heart: Regular rate and rhythm, no murmurs. Abdomen: Soft, non-tender, non-distended, bowel sounds present.  Extremities: No cyanosis, clubbing, or edema Neurologic: Alert & oriented X3, cranial nerves II-XII intact, strength grossly intact, sensation intact to light touch   Lab Results Lab Results  Component Value Date   WBC 10.6* 05/14/2014   HGB 13.1 05/14/2014   HCT 40.3 05/14/2014   MCV 85.4 05/14/2014   PLT 344 05/14/2014    Lab Results  Component Value Date   CREATININE 0.60 05/13/2014   BUN 6 05/13/2014   NA 141 05/13/2014   K 3.6* 05/13/2014   CL 105 05/13/2014   CO2 25 05/13/2014    Lab Results  Component Value Date   ALT 14 05/13/2014   AST 13 05/13/2014   ALKPHOS 79 05/13/2014   BILITOT 0.9 05/13/2014      Microbiology: Recent Results (from the past 240 hour(s))  BODY FLUID CULTURE     Status: None   Collection Time    05/12/14  7:50 PM      Result Value Ref Range Status   Specimen Description PLEURAL FLUID RIGHT   Final   Special Requests NONE   Final   Gram Stain     Final   Value: RARE WBC PRESENT,BOTH PMN AND MONONUCLEAR     NO ORGANISMS SEEN     Performed at [facility]   Culture     Final   Value: NO GROWTH 1 DAY     Performed at [facility]   Report Status PENDING   Incomplete  AFB CULTURE WITH SMEAR     Status: None   Collection Time    05/12/14  7:50 PM      Result Value Ref Range Status   Specimen Description PLEURAL  FLUID RIGHT   Final   Special Requests NONE   Final   Acid Fast Smear     Final   Value: NO ACID FAST BACILLI SEEN     Performed at [facility]   Culture     Final   Value: CULTURE WILL BE EXAMINED FOR 6 WEEKS BEFORE ISSUING A FINAL REPORT     Performed at [facility]   Report Status PENDING   Incomplete  FUNGUS CULTURE W SMEAR     Status: None   Collection Time    05/12/14  7:50 PM      Result Value Ref Range Status   Specimen Description PLEURAL FLUID RIGHT   Final   Special Requests NONE   Final   Fungal Smear     Final   Value: NO YEAST OR FUNGAL ELEMENTS SEEN     Performed at [facility]   Culture     Final   Value: CULTURE IN PROGRESS FOR FOUR WEEKS     Performed at [facility]   Report Status PENDING   Incomplete  CULTURE, BLOOD (ROUTINE X 2)     Status: None   Collection Time    05/12/14  8:15 PM      Result Value Ref Range Status   Specimen Description BLOOD RIGHT ARM   Final   Special Requests  BOTTLES DRAWN AEROBIC AND ANAEROBIC 6CC   Final   Culture  Setup Time     Final   Value: 05/13/2014 00:24     Performed at [facility]   Culture     Final   Value:        BLOOD CULTURE RECEIVED NO GROWTH TO DATE CULTURE WILL BE HELD FOR 5 DAYS BEFORE ISSUING A FINAL NEGATIVE REPORT     Performed at [facility]   Report Status PENDING   Incomplete  CULTURE, BLOOD (ROUTINE X 2)     Status: None   Collection Time    05/12/14  8:20 PM      Result Value Ref Range Status   Specimen Description BLOOD RIGHT HAND   Final   Special Requests BOTTLES DRAWN AEROBIC ONLY 10CC   Final   Culture  Setup Time     Final   Value: 05/13/2014 00:23     Performed at [facility]   Culture     Final   Value:        BLOOD CULTURE RECEIVED NO GROWTH TO DATE CULTURE WILL BE HELD FOR 5 DAYS BEFORE ISSUING A FINAL NEGATIVE REPORT     Performed at [facility]   Report Status PENDING   Incomplete    Studies/Results: Dg Chest 2 View  05/12/2014   CLINICAL DATA:  Cough and congestion.  EXAM: CHEST  2 VIEW  COMPARISON:  05/11/2014  FINDINGS: Two views of this chest demonstrate a right ventricular-pleural shunt catheter. There appears to be slightly increased right pleural fluid with compressive atelectasis. The right pleural effusion is moderate-to-large in size. The left lung remains clear. Trachea is midline. Heart size is stable.  IMPRESSION: There is a moderate-to-large sized right pleural effusion which has slightly enlarged since 05/11/2014.  Right ventricular-pleural shunt.   Electronically Signed   By: Richarda Overlie M.D.   On: 05/12/2014 17:14   Dg Abd 1 View  05/12/2014   CLINICAL DATA:  Cough and congestion, fever, VP shunt  EXAM: ABDOMEN - 1 VIEW  COMPARISON:  None.  FINDINGS: The bowel gas pattern is normal. No radio-opaque calculi or other significant radiographic abnormality are seen. Mild stool volume. No VP shunt tubing is identified. IUD in place over the pelvis.  IMPRESSION:  Negative.   Electronically Signed   By: Christiana Pellant M.D.   On: 05/12/2014 17:12   Ct Head Wo Contrast  05/12/2014   CLINICAL DATA:  Headache.  History of VP shunt.  EXAM: CT HEAD WITHOUT CONTRAST  TECHNIQUE: Contiguous axial images were obtained from the base of the skull through the vertex without intravenous contrast.  COMPARISON:  12/04/2013  FINDINGS: Stable position of the ventriculostomy catheter. Maximal Min ventricular diameter is stable at 4.2 cm. Stable prominent temporal horns. No extra-axial fluid collections are identified. The gray-white differentiation is maintained. No findings for hemispheric infarction an or intracranial hemorrhage. The brainstem and cerebellum are grossly normal and stable.  No significant bony findings.  IMPRESSION: Stable ventriculostomy shunt catheter.  Stable appearance and size of the ventricles. No mass effect or shift.  No acute intracranial findings.   Electronically Signed   By: Loralie Champagne M.D.   On: 05/12/2014 17:01   Ct Chest W Contrast  05/12/2014   CLINICAL DATA:  Progressive right pleural effusion. Ventricular shunt  EXAM: CT CHEST WITH CONTRAST  TECHNIQUE: Multidetector CT imaging of the chest was performed during intravenous contrast administration.  CONTRAST:  75mL OMNIPAQUE IOHEXOL 300 MG/ML  SOLN  COMPARISON:  CT chest 12/30/2013  FINDINGS: Ventricular pleural shunt is present with the shunt tubing terminating in the pleural space on the right posteriorly, unchanged from the prior study. Moderate right pleural effusion has progressed. There is increased loculated fluid anteriorly in the base on the right. Progression of right lower lobe atelectasis.  Negative for pleural effusion on the left. No mass or adenopathy is detected.  The aortic arch is normal.  Pulmonary arteries opacified normally.  IMPRESSION: Mild increase in right pleural effusion with a loculated component anteriorly. Increase in right lower lobe compressive atelectasis.  Left lung  remains clear.   Electronically Signed   By: Marlan Palau M.D.   On: 05/12/2014 21:02   Dg Chest Port 1 View  05/12/2014   CLINICAL DATA:  Post thoracentesis  EXAM: PORTABLE CHEST - 1 VIEW  COMPARISON:  05/12/2014  FINDINGS: There is small to moderate right pleural effusion with right basilar atelectasis or infiltrate. No pulmonary edema. No pneumothorax. Right ventricular pleural catheter is stable in position.  IMPRESSION: Small to moderate right pleural effusion with right basilar atelectasis or infiltrate. No pulmonary edema. No pneumothorax.   Electronically Signed   By: Natasha Mead M.D.   On: 05/12/2014 20:01    Assessment: 38 y/o F w/ h/o hydrocephalus w/ ventriculopleural shunt (revised 11/2013), admitted for worsening SOB and headaches, found to have increased exudative (lymphocyte predominant) pleural effusion. Patient w/ improved cough today, says she is feeling much better. No fevers overnight, now with mild leukocytosis of 10.6. Pleural fluid cultures from 05/12/14 show no growth to date.   Plan: 1. We will continue to follow pleural fluid cultures. Hold off on antibiotic therapy at this time.  Lars Masson, MD PGY-2, Internal Medicine Pager: [phone] 05/14/2014, 11:27 AM   Attending addendum: She is feeling better today with decreased headache and cough. She is asking when she can go home. Her pleural fluid cultures are negative at 48 hours. Still highly suspicious of indolent shunt infection. I still favor observation off of antibiotics pending final culture results.  Even if cultures are negative she may still need externalization of the shunt further cultures before deciding on antimicrobial management.

## 2014-05-14 NOTE — Progress Notes (Signed)
Patient ID: Lisa LimerickOSANNA Johns, female   DOB: 08/21/1976, 38 y.o.   MRN: [id number] Subjective:  The patient is alert and pleasant. She looks well. She denies headaches. She is accompanied by some friends who speak [place] and [place] excellently he  Objective: Vital signs in last 24 hours: Temp:  [98.7 F (37.1 C)-98.8 F (37.1 C)] 98.8 F (37.1 C) (08/07 0456) Pulse Rate:  [92-97] 92 (08/07 0456) Resp:  [12-15] 12 (08/07 0456) BP: (87-104)/(47-59) 87/47 mmHg (08/07 0456) SpO2:  [94 %-97 %] 94 % (08/07 0456)  Intake/Output from previous day: 08/06 0701 - 08/07 0700 In: 243 [P.O.:240; I.V.:3] Out: -  Intake/Output this shift:    Physical exam the patient is alert and oriented x3. Her speech is normal her strength is normal.  Lab Results:  Recent Labs  05/13/14 0727 05/14/14 0529  WBC 5.9 10.6*  HGB 12.3 13.1  HCT 38.4 40.3  PLT 319 344   BMET  Recent Labs  05/12/14 1617 05/13/14 0753  NA 142 141  K 3.5* 3.6*  CL 105 105  CO2 26 25  GLUCOSE 92 80  BUN 12 6  CREATININE 0.62 0.60  CALCIUM 8.5 8.5    Studies/Results: Dg Chest 2 View  05/12/2014   CLINICAL DATA:  Cough and congestion.  EXAM: CHEST  2 VIEW  COMPARISON:  05/11/2014  FINDINGS: Two views of this chest demonstrate a right ventricular-pleural shunt catheter. There appears to be slightly increased right pleural fluid with compressive atelectasis. The right pleural effusion is moderate-to-large in size. The left lung remains clear. Trachea is midline. Heart size is stable.  IMPRESSION: There is a moderate-to-large sized right pleural effusion which has slightly enlarged since 05/11/2014.  Right ventricular-pleural shunt.   Electronically Signed   By: Richarda OverlieAdam  Henn M.D.   On: 05/12/2014 17:14   Dg Abd 1 View  05/12/2014   CLINICAL DATA:  Cough and congestion, fever, VP shunt  EXAM: ABDOMEN - 1 VIEW  COMPARISON:  None.  FINDINGS: The bowel gas pattern is normal. No radio-opaque calculi or other significant  radiographic abnormality are seen. Mild stool volume. No VP shunt tubing is identified. IUD in place over the pelvis.  IMPRESSION: Negative.   Electronically Signed   By: Christiana PellantGretchen  Green M.D.   On: 05/12/2014 17:12   Ct Head Wo Contrast  05/12/2014   CLINICAL DATA:  Headache.  History of VP shunt.  EXAM: CT HEAD WITHOUT CONTRAST  TECHNIQUE: Contiguous axial images were obtained from the base of the skull through the vertex without intravenous contrast.  COMPARISON:  12/04/2013  FINDINGS: Stable position of the ventriculostomy catheter. Maximal Min ventricular diameter is stable at 4.2 cm. Stable prominent temporal horns. No extra-axial fluid collections are identified. The gray-white differentiation is maintained. No findings for hemispheric infarction an or intracranial hemorrhage. The brainstem and cerebellum are grossly normal and stable.  No significant bony findings.  IMPRESSION: Stable ventriculostomy shunt catheter.  Stable appearance and size of the ventricles. No mass effect or shift.  No acute intracranial findings.   Electronically Signed   By: Loralie ChampagneMark  Gallerani M.D.   On: 05/12/2014 17:01   Ct Chest W Contrast  05/12/2014   CLINICAL DATA:  Progressive right pleural effusion. Ventricular shunt  EXAM: CT CHEST WITH CONTRAST  TECHNIQUE: Multidetector CT imaging of the chest was performed during intravenous contrast administration.  CONTRAST:  75mL OMNIPAQUE IOHEXOL 300 MG/ML  SOLN  COMPARISON:  CT chest 12/30/2013  FINDINGS: Ventricular pleural shunt is present with  the shunt tubing terminating in the pleural space on the right posteriorly, unchanged from the prior study. Moderate right pleural effusion has progressed. There is increased loculated fluid anteriorly in the base on the right. Progression of right lower lobe atelectasis.  Negative for pleural effusion on the left. No mass or adenopathy is detected.  The aortic arch is normal.  Pulmonary arteries opacified normally.  IMPRESSION: Mild increase  in right pleural effusion with a loculated component anteriorly. Increase in right lower lobe compressive atelectasis.  Left lung remains clear.   Electronically Signed   By: Marlan Palau M.D.   On: 05/12/2014 21:02   Dg Chest Port 1 View  05/12/2014   CLINICAL DATA:  Post thoracentesis  EXAM: PORTABLE CHEST - 1 VIEW  COMPARISON:  05/12/2014  FINDINGS: There is small to moderate right pleural effusion with right basilar atelectasis or infiltrate. No pulmonary edema. No pneumothorax. Right ventricular pleural catheter is stable in position.  IMPRESSION: Small to moderate right pleural effusion with right basilar atelectasis or infiltrate. No pulmonary edema. No pneumothorax.   Electronically Signed   By: Natasha Mead M.D.   On: 05/12/2014 20:01    Assessment/Plan: Ventricular pleural shunt malfunction, pleural effusion: I have had a long discussion with the patient and her friends. She has a very complex situation. Her head CT yesterday demonstrates shows ventricular enlargement and temporal horns when compared to her prior study in 2008 (which showed that her left lateral ventricle was decompressed and she did not have significant temporal horns). I suspect she has at least a partial shunt malfunction and a possible ventriculopleural shunt infection. I have recommended a shunt tap to obtain spinal fluid to see if she has a shunt infection. I explained the procedure and the risk. I've answered all her questions. She has decided proceed with a shunt tap.  If the spinal fluid appears infected then I think we should externalize her shunt and treat her with antibiotics. A question for a future date would be whether we can remove her shunt or replace it with another shunt. Ideally I would like to remove her shunt but is not clear to me whether she is shunt dependent at this point.  LOS: 2 days     Fillmore Bynum D 05/14/2014, 2:00 PM

## 2014-05-14 NOTE — Progress Notes (Addendum)
RN notified me of increasing nonproductive cough.  I evaluated the patient.  She confirmed increase nonproductive cough after Ventriculopleural shunt tap earlier today.  She denies any f/c, chest pain, sob, n/v, headache or visual disturbance.    VS 98.4--RR16--HR 102--110/65--99% on RA Head--shunt site without drainage or erythema CV RRR, no rub Lung--R-CTA,, left--diminished at base without wheezing Abd--soft/NT+BS  Patient reassured.  Asked RN to give robitussin that was previously ordered. CXR ordered.  Case discussed with Dr. Tressie StalkerJeffrey Jenkins.  CSF results from shunt tap reviewed with normal protein, normal glucose and one WBC.  [name] [phone]

## 2014-05-15 ENCOUNTER — Inpatient Hospital Stay (HOSPITAL_COMMUNITY): Payer: Medicaid Other

## 2014-05-15 DIAGNOSIS — G911 Obstructive hydrocephalus: Secondary | ICD-10-CM

## 2014-05-15 LAB — GLUCOSE, SEROUS FLUID: Glucose, Fluid: <20 mg/dL

## 2014-05-15 LAB — BODY FLUID CELL COUNT WITH DIFFERENTIAL
Eos, Fluid: 7 %
Lymphs, Fluid: 67 %
Monocyte-Macrophage-Serous Fluid: 7 % — ABNORMAL LOW (ref 50–90)
NEUTROPHIL FLUID: 19 % (ref 0–25)
Total Nucleated Cell Count, Fluid: 1671 cu mm — ABNORMAL HIGH (ref 0–1000)

## 2014-05-15 LAB — PROTEIN, BODY FLUID: Total protein, fluid: 0.2 g/dL

## 2014-05-15 LAB — LACTATE DEHYDROGENASE: LDH: 153 U/L (ref 94–250)

## 2014-05-15 LAB — HEPATIC FUNCTION PANEL
ALT: 19 U/L (ref 0–35)
AST: 17 U/L (ref 0–37)
Albumin: 2.9 g/dL — ABNORMAL LOW (ref 3.5–5.2)
Alkaline Phosphatase: 84 U/L (ref 39–117)
Bilirubin, Direct: <0.2 mg/dL (ref 0.0–0.3)
Total Bilirubin: 0.3 mg/dL (ref 0.3–1.2)
Total Protein: 7.3 g/dL (ref 6.0–8.3)

## 2014-05-15 LAB — LACTATE DEHYDROGENASE, PLEURAL OR PERITONEAL FLUID: LD, Fluid: 10 U/L (ref 3–23)

## 2014-05-15 MED ORDER — HYDROCODONE-HOMATROPINE 5-1.5 MG/5ML PO SYRP: 5.0000 mL | ORAL_SOLUTION | Freq: Four times a day (QID) | ORAL | Status: DC | PRN

## 2014-05-15 MED ORDER — DIPHENHYDRAMINE HCL 25 MG PO CAPS
25.0000 mg | ORAL_CAPSULE | Freq: Four times a day (QID) | ORAL | Status: DC | PRN
  Administered 2014-05-15 – 2014-05-16 (×2): 25 mg via ORAL
  Filled 2014-05-15 (×2): qty 1

## 2014-05-15 NOTE — Progress Notes (Signed)
Name: Lisa LimerickROSANNA Johns MRN: [id number] DOB: 09/28/1976  ELECTRONIC ICU PHYSICIAN NOTE  Problem:  ID requesting repeat tap in pt with loculated effusion  Intervention:  rec IR to retap best location under u/s with CT correlation  Sandrea HughsMichael Sindee Stucker 05/15/2014, 3:59 PM

## 2014-05-15 NOTE — Procedures (Signed)
Ultrasound guided right thoracentesis.  Removed 600 ml of yellow fluid.  No immediate complication.  Plan for follow up CXR.

## 2014-05-15 NOTE — Progress Notes (Signed)
PROGRESS NOTE  [name] ZOX:[id number] DOB: 11/17/1975 DOA: 05/12/2014 PCP: Pcp Not In System  Assessment/Plan: Right Pleural Effusion with ventriculopleural shunt -pt has ventriculopleural shunt with revision on 12/04/13  -8/5/15Thoracocentesis shows WBC 2929 with 50Mono/22E/28N  - Remain Concerned about ventriculopleural shunt infection although shunt tap showed  normal protein, normal glucose and one WBC. -Appreciate Daiva Eves and Dr. Lovell Sheehan followup  -Hold abx per ID  -Fluid suggestive of exudate  -Gram stain and culture negative of pleural fluid at 48 hours  -Blood cultures negative at 48 hours  -05/12/2014 thoracocentesis--performed by Dr. Craige Cotta  -05/15/14--Repeat Thoracocentesis for cell count, fungal, bacterial and AFB cultures-->also send for MTB PCR and adenosine deaminase and cytology -case discussed with Dr. Sherene Sires -Even if cultures are negative she may still need externalization of the shunt with shunt cultures before deciding on antimicrobial management.  Headache  -resolved  -05/14/14 shunt tap culture neg -Likely related to ventriculopleural shunt  -Headache improved after thoracocentesis  -CT brain shows no acute findings  -Appreciate neurosurgery followup Cough -likely due to pleural effusion -start hycodan  Family Communication:   husband at beside;  Total time with >50% time spent counseling patient and coordinating care Disposition Plan:   Home when medically stable       Procedures/Studies: Dg Chest 1 View  05/15/2014   CLINICAL DATA:  Post right thoracentesis  EXAM: CHEST - 1 VIEW  COMPARISON:  05/14/2014  FINDINGS: No pneumothorax status post right thoracentesis.  Moderate right pleural effusion. Right pleural drain. Associated right lower lobe opacity, likely compressive atelectasis.  Left lung is clear.  The heart is normal in size.  IMPRESSION: No pneumothorax status post right thoracentesis.  Moderate right pleural effusion with right  pleural drain.   Electronically Signed   By: Charline Bills M.D.   On: 05/15/2014 16:36   Dg Chest 2 View  05/14/2014   CLINICAL DATA:  38 year old female with cough and pleural effusion. Ventriculoperitoneal shunt.  EXAM: CHEST  2 VIEW  COMPARISON:  05/12/2014 and prior radiographs and CTs  FINDINGS: A moderate right pleural effusion is unchanged with right lower lung atelectasis.  The left lung is clear.  The cardiomediastinal silhouette is stable.  A ventriculoperitoneal shunt is again identified overlying the right chest with tip overlying the lower right hemithorax. There is no evidence of pneumothorax.  IMPRESSION: Unchanged moderate right pleural effusion with right basilar atelectasis. Unchanged position of ventriculoperitoneal shunt.   Electronically Signed   By: Laveda Abbe M.D.   On: 05/14/2014 20:22   Dg Chest 2 View  05/12/2014   CLINICAL DATA:  Cough and congestion.  EXAM: CHEST  2 VIEW  COMPARISON:  05/11/2014  FINDINGS: Two views of this chest demonstrate a right ventricular-pleural shunt catheter. There appears to be slightly increased right pleural fluid with compressive atelectasis. The right pleural effusion is moderate-to-large in size. The left lung remains clear. Trachea is midline. Heart size is stable.  IMPRESSION: There is a moderate-to-large sized right pleural effusion which has slightly enlarged since 05/11/2014.  Right ventricular-pleural shunt.   Electronically Signed   By: Richarda Overlie M.D.   On: 05/12/2014 17:14   Dg Chest 2 View  05/11/2014   CLINICAL DATA:  Cough and decreased breath sounds on the right  EXAM: CHEST  2 VIEW  COMPARISON:  Chest radiograph December 30, 2013 and chest CT December 30, 2013  FINDINGS: There is a persistent pleural effusion on  the right which may be slightly increased. There is atelectasis and possible consolidation in the right middle and lower lobes. The left lung is clear. Heart size and pulmonary vascularity are normal. No adenopathy. There is a shunt  catheter on the right which appears coiled in the right hemi thorax.  IMPRESSION: Shunt catheter appears coiled in the right hemithorax. The sizable effusion on the right may at least in part be due to fluid collecting in the right chest from the shunt catheter. There is probable compressive atelectasis in the right middle and lower lobes. Left lung is clear.  These results will be called to the ordering clinician or representative by the Radiologist Assistant, and communication documented in the PACS or zVision Dashboard.   Electronically Signed   By: Bretta BangWilliam  Woodruff M.D.   On: 05/11/2014 16:38   Dg Abd 1 View  05/12/2014   CLINICAL DATA:  Cough and congestion, fever, VP shunt  EXAM: ABDOMEN - 1 VIEW  COMPARISON:  None.  FINDINGS: The bowel gas pattern is normal. No radio-opaque calculi or other significant radiographic abnormality are seen. Mild stool volume. No VP shunt tubing is identified. IUD in place over the pelvis.  IMPRESSION: Negative.   Electronically Signed   By: Christiana PellantGretchen  Green M.D.   On: 05/12/2014 17:12   Ct Head Wo Contrast  05/12/2014   CLINICAL DATA:  Headache.  History of VP shunt.  EXAM: CT HEAD WITHOUT CONTRAST  TECHNIQUE: Contiguous axial images were obtained from the base of the skull through the vertex without intravenous contrast.  COMPARISON:  12/04/2013  FINDINGS: Stable position of the ventriculostomy catheter. Maximal Min ventricular diameter is stable at 4.2 cm. Stable prominent temporal horns. No extra-axial fluid collections are identified. The gray-white differentiation is maintained. No findings for hemispheric infarction an or intracranial hemorrhage. The brainstem and cerebellum are grossly normal and stable.  No significant bony findings.  IMPRESSION: Stable ventriculostomy shunt catheter.  Stable appearance and size of the ventricles. No mass effect or shift.  No acute intracranial findings.   Electronically Signed   By: Loralie ChampagneMark  Gallerani M.D.   On: 05/12/2014 17:01   Ct  Chest W Contrast  05/12/2014   CLINICAL DATA:  Progressive right pleural effusion. Ventricular shunt  EXAM: CT CHEST WITH CONTRAST  TECHNIQUE: Multidetector CT imaging of the chest was performed during intravenous contrast administration.  CONTRAST:  75mL OMNIPAQUE IOHEXOL 300 MG/ML  SOLN  COMPARISON:  CT chest 12/30/2013  FINDINGS: Ventricular pleural shunt is present with the shunt tubing terminating in the pleural space on the right posteriorly, unchanged from the prior study. Moderate right pleural effusion has progressed. There is increased loculated fluid anteriorly in the base on the right. Progression of right lower lobe atelectasis.  Negative for pleural effusion on the left. No mass or adenopathy is detected.  The aortic arch is normal.  Pulmonary arteries opacified normally.  IMPRESSION: Mild increase in right pleural effusion with a loculated component anteriorly. Increase in right lower lobe compressive atelectasis.  Left lung remains clear.   Electronically Signed   By: Marlan Palauharles  Clark M.D.   On: 05/12/2014 21:02   Dg Chest Port 1 View  05/12/2014   CLINICAL DATA:  Post thoracentesis  EXAM: PORTABLE CHEST - 1 VIEW  COMPARISON:  05/12/2014  FINDINGS: There is small to moderate right pleural effusion with right basilar atelectasis or infiltrate. No pulmonary edema. No pneumothorax. Right ventricular pleural catheter is stable in position.  IMPRESSION: Small to moderate right pleural  effusion with right basilar atelectasis or infiltrate. No pulmonary edema. No pneumothorax.   Electronically Signed   By: Natasha Mead M.D.   On: 05/12/2014 20:01   US Thoracentesis Asp Pleural Space W/img Guide  05/15/2014   CLINICAL DATA:  Right pleural effusion. Right ventricular pleural shunt.  EXAM: ULTRASOUND GUIDED RIGHT THORACENTESIS  COMPARISON:  CT 05/12/2014  PROCEDURE: An ultrasound guided thoracentesis was thoroughly discussed with the patient and questions answered. The benefits, risks, alternatives and  complications were also discussed. The patient understands and wishes to proceed with the procedure. Written consent was obtained.  Ultrasound was performed to localize and mark an adequate pocket of fluid in the right posterior chest. The area was then prepped and draped in the normal sterile fashion. 1% Lidocaine was used for local anesthesia. Under ultrasound guidance a 19 gauge Yueh catheter was introduced. Thoracentesis was performed. The catheter was removed and a dressing applied.  Complications:  None.  FINDINGS: A total of approximately 600 mL of yellow fluid was removed. A fluid sample wassent for laboratory analysis. The right pleural fluid is complex with septations.  IMPRESSION: Successful ultrasound guided right thoracentesis yielding 600 ml of pleural fluid.   Electronically Signed   By: Richarda Overlie M.D.   On: 05/15/2014 16:30         Subjective: Patient continues to complain of cough. She has intermittent dyspnea on exertion. She denies any fevers, chills, chest discomfort, nausea, vomiting, diarrhea, vomiting, dysuria, hematuria. No headache or visual disturbance.  Objective: Filed Vitals:   05/15/14 1404 05/15/14 1550 05/15/14 1602 05/15/14 1609  BP: 114/76 111/68 108/73 123/72  Pulse: 96     Temp: 97.7 F (36.5 C)     TempSrc: Oral     Resp: 18     Height:      Weight:      SpO2: 96% 97% 96% 96%    Intake/Output Summary (Last 24 hours) at 05/15/14 1937 Last data filed at 05/14/14 2039  Gross per 24 hour  Intake      3 ml  Output      0 ml  Net      3 ml   Weight change:  Exam:   General:  Pt is alert, follows commands appropriately, not in acute distress  HEENT: No icterus, No thrush,  [place]/AT  Cardiovascular: RRR, S1/S2, no rubs, no gallops  Respiratory: Diminished breath sounds right base. Left clear to auscultation. No wheezing.  Abdomen: Soft/+BS, non tender, non distended, no guarding  Extremities: No edema, No lymphangitis, No petechiae, No rashes,  no synovitis  Data Reviewed: Basic Metabolic Panel:  Recent Labs Lab 05/12/14 1617 05/13/14 0753  NA 142 141  K 3.5* 3.6*  CL 105 105  CO2 26 25  GLUCOSE 92 80  BUN 12 6  CREATININE 0.62 0.60  CALCIUM 8.5 8.5   Liver Function Tests:  Recent Labs Lab 05/13/14 0753  AST 13  ALT 14  ALKPHOS 79  BILITOT 0.9  PROT 6.7  ALBUMIN 2.7*   No results found for this basename: LIPASE, AMYLASE,  in the last 168 hours No results found for this basename: AMMONIA,  in the last 168 hours CBC:  Recent Labs Lab 05/11/14 1627 05/12/14 1617 05/13/14 0727 05/14/14 0529  WBC 8.2 6.9 5.9 10.6*  NEUTROABS  --  4.6  --   --   HGB 12.8 11.9* 12.3 13.1  HCT 38.9 37.0 38.4 40.3  MCV 83.9 85.3 85.7 85.4  PLT  --  323 319 344   Cardiac Enzymes: No results found for this basename: CKTOTAL, CKMB, CKMBINDEX, TROPONINI,  in the last 168 hours BNP: No components found with this basename: POCBNP,  CBG: No results found for this basename: GLUCAP,  in the last 168 hours  Recent Results (from the past 240 hour(s))  BODY FLUID CULTURE     Status: None   Collection Time    05/12/14  7:50 PM      Result Value Ref Range Status   Specimen Description PLEURAL FLUID RIGHT   Final   Special Requests NONE   Final   Gram Stain     Final   Value: RARE WBC PRESENT,BOTH PMN AND MONONUCLEAR     NO ORGANISMS SEEN     Performed at [facility]   Culture     Final   Value: NO GROWTH 2 DAYS     Performed at [facility]   Report Status PENDING   Incomplete  AFB CULTURE WITH SMEAR     Status: None   Collection Time    05/12/14  7:50 PM      Result Value Ref Range Status   Specimen Description PLEURAL FLUID RIGHT   Final   Special Requests NONE   Final   Acid Fast Smear     Final   Value: NO ACID FAST BACILLI SEEN     Performed at [facility]   Culture     Final   Value: CULTURE WILL BE EXAMINED FOR 6 WEEKS BEFORE ISSUING A FINAL REPORT     Performed at [facility]   Report Status PENDING   Incomplete  FUNGUS CULTURE W SMEAR     Status: None   Collection Time    05/12/14  7:50 PM      Result Value Ref Range Status   Specimen Description PLEURAL FLUID RIGHT   Final   Special Requests NONE   Final   Fungal Smear     Final   Value: NO YEAST OR FUNGAL ELEMENTS SEEN     Performed at [facility]   Culture     Final   Value: CULTURE IN PROGRESS FOR FOUR WEEKS     Performed at [facility]   Report Status PENDING   Incomplete  CULTURE, BLOOD (ROUTINE X 2)     Status: None   Collection Time    05/12/14  8:15 PM      Result Value Ref Range Status   Specimen Description BLOOD RIGHT ARM   Final   Special Requests BOTTLES DRAWN AEROBIC AND ANAEROBIC 6CC   Final   Culture  Setup Time     Final   Value: 05/13/2014 00:24     Performed at [facility]   Culture     Final   Value:        BLOOD CULTURE RECEIVED NO GROWTH TO DATE CULTURE WILL BE HELD FOR 5 DAYS BEFORE ISSUING A FINAL NEGATIVE REPORT     Performed at [facility]   Report Status PENDING   Incomplete  CULTURE, BLOOD (ROUTINE X 2)     Status: None   Collection Time    05/12/14  8:20 PM      Result Value Ref Range Status   Specimen Description BLOOD RIGHT HAND   Final   Special Requests BOTTLES DRAWN AEROBIC ONLY 10CC   Final   Culture  Setup Time     Final  Value: 05/13/2014 00:23     Performed at [facility]   Culture     Final   Value:        BLOOD CULTURE RECEIVED NO GROWTH TO DATE CULTURE WILL BE HELD FOR 5 DAYS BEFORE ISSUING A FINAL NEGATIVE REPORT     Performed at [facility]   Report Status PENDING   Incomplete  CSF CULTURE     Status: None   Collection Time    05/14/14  3:04 PM      Result Value Ref Range Status   Specimen Description CSF   Final   Special Requests NONE   Final   Gram Stain     Final   Value: WBC PRESENT, PREDOMINANTLY MONONUCLEAR     NO ORGANISMS SEEN     CYTOSPIN Performed at [facility]       Performed at [facility]   Culture     Final   Value: NO GROWTH 1 DAY     Performed at [facility]   Report Status PENDING   Incomplete  GRAM STAIN     Status: None   Collection Time    05/14/14  3:04 PM      Result Value Ref Range Status   Specimen Description CSF   Final   Special Requests NONE   Final   Gram Stain     Final   Value: CYTOSPIN PREP     WBC PRESENT, PREDOMINANTLY MONONUCLEAR     NO ORGANISMS SEEN   Report Status 05/14/2014 FINAL   Final     Scheduled Meds: . heparin  5,000 Units Subcutaneous 3 times per day  . senna  1 tablet Oral BID  . sodium chloride  3 mL Intravenous Q12H   Continuous Infusions:    Shayann Garbutt, DO  [facility] Pager [phone]  If 7PM-7AM, please contact night-coverage [email or web address] Password [email or web address] 05/15/2014, 7:37 PM   LOS: 3 days

## 2014-05-15 NOTE — Progress Notes (Signed)
[facility]    Subjective: No new complaints   Antibiotics:  Anti-infectives   None      Medications: Scheduled Meds: . heparin  5,000 Units Subcutaneous 3 times per day  . senna  1 tablet Oral BID  . sodium chloride  3 mL Intravenous Q12H   Continuous Infusions:  PRN Meds:.sodium chloride, acetaminophen, acetaminophen, albuterol, alum & mag hydroxide-simeth, guaiFENesin-dextromethorphan, morphine injection, ondansetron (ZOFRAN) IV, ondansetron, oxyCODONE, sodium chloride    Objective: Weight change:   Intake/Output Summary (Last 24 hours) at 05/15/14 1140 Last data filed at 05/14/14 2039  Gross per 24 hour  Intake      3 ml  Output      0 ml  Net      3 ml   Blood pressure 101/66, pulse 84, temperature 98.5 F (36.9 C), temperature source Oral, resp. rate 12, height 5\' 6"  (1.676 m), weight 143 lb 4.8 oz (65 kg), last menstrual period 04/27/2014, SpO2 94.00%. Temp:  [98.5 F (36.9 C)-99.4 F (37.4 C)] 98.5 F (36.9 C) (08/08 0529) Pulse Rate:  [84-105] 84 (08/08 0529) Resp:  [12-15] 12 (08/08 0529) BP: (101-113)/(66-72) 101/66 mmHg (08/08 0529) SpO2:  [94 %-98 %] 94 % (08/08 0529)  Physical Exam: General: Alert and awake, oriented x3, not in any acute distress. HEENT: anicteric sclera, pupils reactive to light and accommodation, EOMI CVS regular rate, normal r,  no murmur rubs or gallops Chest: clear to auscultation bilaterally, no wheezing, rales or rhonchi Abdomen: soft nontender, nondistended, normal bowel sounds, Skin: no rashes Neuro: nonfocal  CBC:  Recent Labs Lab 05/11/14 1627 05/12/14 1617 05/13/14 0727 05/14/14 0529  HGB 12.8 11.9* 12.3 13.1  HCT 38.9 37.0 38.4 40.3  PLT  --  323 319 344     BMET  Recent Labs  05/12/14 1617 05/13/14 0753  NA 142 141  K 3.5* 3.6*  CL 105 105  CO2 26 25  GLUCOSE 92 80  BUN 12 6  CREATININE 0.62 0.60  CALCIUM 8.5 8.5     Liver Panel   Recent Labs   05/13/14 0753  PROT 6.7  ALBUMIN 2.7*  AST 13  ALT 14  ALKPHOS 79  BILITOT 0.9       Sedimentation Rate No results found for this basename: ESRSEDRATE,  in the last 72 hours C-Reactive Protein No results found for this basename: CRP,  in the last 72 hours  Micro Results: Recent Results (from the past 720 hour(s))  BODY FLUID CULTURE     Status: None   Collection Time    05/12/14  7:50 PM      Result Value Ref Range Status   Specimen Description PLEURAL FLUID RIGHT   Final   Special Requests NONE   Final   Gram Stain     Final   Value: RARE WBC PRESENT,BOTH PMN AND MONONUCLEAR     NO ORGANISMS SEEN     [facility]   Culture     Final   Value: NO GROWTH 1 DAY     [facility]   Report Status PENDING   Incomplete  AFB CULTURE WITH SMEAR     Status: None   Collection Time    05/12/14  7:50 PM      Result Value Ref Range Status   Specimen Description PLEURAL FLUID RIGHT   Final   Special Requests NONE   Final   Acid Fast Smear  Final   Value: NO ACID FAST BACILLI SEEN     Performed at [facility]   Culture     Final   Value: CULTURE WILL BE EXAMINED FOR 6 WEEKS BEFORE ISSUING A FINAL REPORT     Performed at [facility]   Report Status PENDING   Incomplete  FUNGUS CULTURE W SMEAR     Status: None   Collection Time    05/12/14  7:50 PM      Result Value Ref Range Status   Specimen Description PLEURAL FLUID RIGHT   Final   Special Requests NONE   Final   Fungal Smear     Final   Value: NO YEAST OR FUNGAL ELEMENTS SEEN     Performed at [facility]   Culture     Final   Value: CULTURE IN PROGRESS FOR FOUR WEEKS     Performed at [facility]   Report Status PENDING   Incomplete  CULTURE, BLOOD (ROUTINE X 2)     Status: None   Collection Time    05/12/14  8:15 PM      Result Value Ref Range Status   Specimen Description BLOOD RIGHT ARM   Final   Special Requests BOTTLES DRAWN AEROBIC  AND ANAEROBIC 6CC   Final   Culture  Setup Time     Final   Value: 05/13/2014 00:24     Performed at [facility]   Culture     Final   Value:        BLOOD CULTURE RECEIVED NO GROWTH TO DATE CULTURE WILL BE HELD FOR 5 DAYS BEFORE ISSUING A FINAL NEGATIVE REPORT     Performed at [facility]   Report Status PENDING   Incomplete  CULTURE, BLOOD (ROUTINE X 2)     Status: None   Collection Time    05/12/14  8:20 PM      Result Value Ref Range Status   Specimen Description BLOOD RIGHT HAND   Final   Special Requests BOTTLES DRAWN AEROBIC ONLY 10CC   Final   Culture  Setup Time     Final   Value: 05/13/2014 00:23     Performed at [facility]   Culture     Final   Value:        BLOOD CULTURE RECEIVED NO GROWTH TO DATE CULTURE WILL BE HELD FOR 5 DAYS BEFORE ISSUING A FINAL NEGATIVE REPORT     Performed at [facility]   Report Status PENDING   Incomplete  CSF CULTURE     Status: None   Collection Time    05/14/14  3:04 PM      Result Value Ref Range Status   Specimen Description CSF   Final   Special Requests NONE   Final   Gram Stain     Final   Value: WBC PRESENT, PREDOMINANTLY MONONUCLEAR     NO ORGANISMS SEEN     CYTOSPIN Performed at [facility]     Performed at [facility]   Culture PENDING   Incomplete   Report Status PENDING   Incomplete  GRAM STAIN     Status: None   Collection Time    05/14/14  3:04 PM      Result Value Ref Range Status   Specimen Description CSF   Final   Special Requests NONE   Final   Gram Stain     Final  Value: CYTOSPIN PREP     WBC PRESENT, PREDOMINANTLY MONONUCLEAR     NO ORGANISMS SEEN   Report Status 05/14/2014 FINAL   Final    Studies/Results: Dg Chest 2 View  05/14/2014   CLINICAL DATA:  38 year old female with cough and pleural effusion. Ventriculoperitoneal shunt.  EXAM: CHEST  2 VIEW  COMPARISON:  05/12/2014 and prior radiographs and CTs  FINDINGS: A moderate right pleural effusion is  unchanged with right lower lung atelectasis.  The left lung is clear.  The cardiomediastinal silhouette is stable.  A ventriculoperitoneal shunt is again identified overlying the right chest with tip overlying the lower right hemithorax. There is no evidence of pneumothorax.  IMPRESSION: Unchanged moderate right pleural effusion with right basilar atelectasis. Unchanged position of ventriculoperitoneal shunt.   Electronically Signed   By: Laveda Abbe M.D.   On: 05/14/2014 20:22      Assessment/Plan:  Active Problems:   Pleural effusion, right   Fever   Headache    [name] is a 38 y.o. 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 Patient underwent aspiration of her shunt with very bland CSF and only 1 white blood cell seen. Her pleural fluid cultures are negative to date including bacterial fungal and acid-fast bacilli cultures and her cultures from her CSF are pending she is afebrile off antibiotics.  #1 large pleural effusion which persists after drainage by CCM: The fluid count is not terribly high and not highly suggestive of a bacterial infection. However there were loculations seen on CT scan and there is concern for the risk of infection of the shunt itself given that the end of it terminates in the right pleural space.  -I wonder if repeat thoracocentesis for repeat pleural fluid analysis with cell count differential protein glucose LDH albumin, high volume pleural fluid for AFB cultures, pleural fluid for bacterial cultures again high volume pleural fluid for fungal cultures and potentially pleural fluid for cytology could be considered.  Additionally a pleural fluid adenosine deaminase level and a pleural fluid Mycobacterium tuberculosis PCR I be helpful.  I will send a serum quantiferon gold on her blood  For now would hold off on  starting antimicrobial therapy. However if we continued to not have a conclusive picture here I might favor parenchyma side effects she treating her for infection I discussed the case briefly with Dr. Lovell Sheehan this morning but will discuss further again tomorrow after following her clinically.  #2 Screening: check for HIV and Hep panel      LOS: 3 days   Acey Lav 05/15/2014, 11:40 AM

## 2014-05-15 NOTE — Progress Notes (Signed)
Patient ID: Lisa LimerickOSANNA Johns, female   DOB: 01/20/1976, 38 y.o.   MRN: [id number] Subjective:  The patient is alert and pleasant. She denies headaches. She has a minimal cough. She wants to go home.  Objective: Vital signs in last 24 hours: Temp:  [98.5 F (36.9 C)-99.4 F (37.4 C)] 98.5 F (36.9 C) (08/08 0529) Pulse Rate:  [84-105] 84 (08/08 0529) Resp:  [12-15] 12 (08/08 0529) BP: (101-113)/(66-72) 101/66 mmHg (08/08 0529) SpO2:  [94 %-98 %] 94 % (08/08 0529)  Intake/Output from previous day: 08/07 0701 - 08/08 0700 In: 3 [I.V.:3] Out: -  Intake/Output this shift:    Physical exam the patient is alert and oriented. Her speech and strength is normal.  Lab Results:  Recent Labs  05/13/14 0727 05/14/14 0529  WBC 5.9 10.6*  HGB 12.3 13.1  HCT 38.4 40.3  PLT 319 344   BMET  Recent Labs  05/12/14 1617 05/13/14 0753  NA 142 141  K 3.5* 3.6*  CL 105 105  CO2 26 25  GLUCOSE 92 80  BUN 12 6  CREATININE 0.62 0.60  CALCIUM 8.5 8.5    Studies/Results: Dg Chest 2 View  05/14/2014   CLINICAL DATA:  38 year old female with cough and pleural effusion. Ventriculoperitoneal shunt.  EXAM: CHEST  2 VIEW  COMPARISON:  05/12/2014 and prior radiographs and CTs  FINDINGS: A moderate right pleural effusion is unchanged with right lower lung atelectasis.  The left lung is clear.  The cardiomediastinal silhouette is stable.  A ventriculoperitoneal shunt is again identified overlying the right chest with tip overlying the lower right hemithorax. There is no evidence of pneumothorax.  IMPRESSION: Unchanged moderate right pleural effusion with right basilar atelectasis. Unchanged position of ventriculoperitoneal shunt.   Electronically Signed   By: Laveda AbbeJeff  Hu M.D.   On: 05/14/2014 20:22    Assessment/Plan: Pleural effusion, possible shunt infection: The patient's cerebral spinal fluid yesterday demonstrated a normal glucose at 75, low protein at 6, and only one white blood cell, i.e. not  obviously infected. Cultures are pending. With this pleural effusion,  I remain somewhat suspicious of a low-grade shunt infection.  I have discussed the situation with the patient and her husband. Clearly we will need to revise her shunt, i.e. placed the distal catheter somewhere else, likely back in the peritoneal cavity.   The question for infectious disease is whether or not we need to externalize the shunt and treat her empirically with IV antibiotics for a shunt infection prior to the revision of the distal catheter(this of course would require the patient to remain in the hospital).  Alternatively, if we are confident that the shunt is not infected, we can  revise her shunt next week, without externalization/pretreatment with antibiotics (in which case the patient can be discharged and come back next week for the revision as she is not particularly ill from this pleural effusion). I will await further input from infectious disease.  I discussed all this with the patient and her husband.  LOS: 3 days     Ryler Laskowski D 05/15/2014, 8:16 AM

## 2014-05-16 DIAGNOSIS — R51 Headache: Secondary | ICD-10-CM

## 2014-05-16 DIAGNOSIS — R519 Headache, unspecified: Secondary | ICD-10-CM | POA: Insufficient documentation

## 2014-05-16 LAB — CBC
HEMATOCRIT: 38.6 % (ref 36.0–46.0)
HEMOGLOBIN: 12.5 g/dL (ref 12.0–15.0)
MCH: 27 pg (ref 26.0–34.0)
MCHC: 32.4 g/dL (ref 30.0–36.0)
MCV: 83.4 fL (ref 78.0–100.0)
Platelets: 354 10*3/uL (ref 150–400)
RBC: 4.63 MIL/uL (ref 3.87–5.11)
RDW: 13.5 % (ref 11.5–15.5)
WBC: 7.7 10*3/uL (ref 4.0–10.5)

## 2014-05-16 LAB — BODY FLUID CULTURE: CULTURE: NO GROWTH

## 2014-05-16 LAB — HEPATITIS PANEL, ACUTE
HCV Ab: NEGATIVE
HEP B C IGM: NONREACTIVE
Hep A IgM: NONREACTIVE
Hepatitis B Surface Ag: NEGATIVE

## 2014-05-16 LAB — HIV ANTIBODY (ROUTINE TESTING W REFLEX): HIV 1&2 Ab, 4th Generation: NONREACTIVE

## 2014-05-16 LAB — BASIC METABOLIC PANEL
Anion gap: 11 (ref 5–15)
BUN: 11 mg/dL (ref 6–23)
CHLORIDE: 104 meq/L (ref 96–112)
CO2: 26 mEq/L (ref 19–32)
Calcium: 8.8 mg/dL (ref 8.4–10.5)
Creatinine, Ser: 0.69 mg/dL (ref 0.50–1.10)
GFR calc Af Amer: >90 mL/min (ref >90)
GFR calc non Af Amer: >90 mL/min (ref >90)
GLUCOSE: 99 mg/dL (ref 70–99)
POTASSIUM: 4 meq/L (ref 3.7–5.3)
SODIUM: 141 meq/L (ref 137–147)

## 2014-05-16 MED ORDER — VANCOMYCIN HCL IN DEXTROSE 750-5 MG/150ML-% IV SOLN
750.0000 mg | Freq: Three times a day (TID) | INTRAVENOUS | Status: DC
  Administered 2014-05-16 – 2014-05-18 (×5): 750 mg via INTRAVENOUS
  Filled 2014-05-16 (×7): qty 150

## 2014-05-16 MED ORDER — METRONIDAZOLE 500 MG PO TABS
500.0000 mg | ORAL_TABLET | Freq: Three times a day (TID) | ORAL | Status: DC
  Administered 2014-05-16 – 2014-05-21 (×15): 500 mg via ORAL
  Filled 2014-05-16 (×22): qty 1

## 2014-05-16 MED ORDER — DEXTROSE 5 % IV SOLN
2.0000 g | Freq: Two times a day (BID) | INTRAVENOUS | Status: DC
  Administered 2014-05-16 – 2014-05-20 (×9): 2 g via INTRAVENOUS
  Filled 2014-05-16 (×12): qty 2

## 2014-05-16 NOTE — Progress Notes (Signed)
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  -Appreciate Daiva Eves and Dr. Lovell Sheehan followup  -Hold abx per ID  -Gram stain and culture negative of pleural fluid--neg -Blood cultures negative -05/15/14--Repeat Thoracocentesis for cell count, fungal, bacterial and AFB cultures-->also send for MTB PCR and adenosine deaminase and cytology  -case discussed with Dr. Sherene Sires  -Even if cultures are negative she may still need externalization of the shunt with shunt cultures before deciding on antimicrobial management.  Headache  -resolved  -05/14/14 shunt tap culture neg  -Likely related to ventriculopleural shunt  -Headache improved after thoracocentesis  -CT brain shows no acute findings  -Appreciate neurosurgery followup  Cough  -likely due to pleural effusion  -start hycodan  Family Communication: discussed with family at bedside Disposition Plan: Home am 05/17/14 if okay with ID        Procedures/Studies: Dg Chest 1 View  05/15/2014   CLINICAL DATA:  Post right thoracentesis  EXAM: CHEST - 1 VIEW  COMPARISON:  05/14/2014  FINDINGS: No pneumothorax status post right thoracentesis.  Moderate right pleural effusion. Right pleural drain. Associated right lower lobe opacity, likely compressive atelectasis.  Left lung is clear.  The heart is normal in size.  IMPRESSION: No pneumothorax status post right thoracentesis.  Moderate right pleural effusion with right pleural drain.   Electronically Signed   By: Charline Bills M.D.   On: 05/15/2014 16:36   Dg Chest 2 View  05/14/2014   CLINICAL DATA:  38 year old female with cough and pleural effusion. Ventriculoperitoneal shunt.  EXAM: CHEST  2 VIEW  COMPARISON:  05/12/2014 and prior radiographs and CTs  FINDINGS: A moderate right pleural effusion is unchanged with right lower lung atelectasis.  The left lung is clear.  The cardiomediastinal silhouette is stable.  A ventriculoperitoneal shunt is again identified overlying the right chest with tip overlying the lower right hemithorax. There is no evidence of pneumothorax.  IMPRESSION: Unchanged moderate right pleural effusion with right basilar atelectasis. Unchanged position of ventriculoperitoneal shunt.   Electronically Signed   By: Laveda Abbe M.D.   On: 05/14/2014 20:22   Dg Chest 2 View  05/12/2014   CLINICAL DATA:  Cough and congestion.  EXAM: CHEST  2 VIEW  COMPARISON:  05/11/2014  FINDINGS: Two views of this chest demonstrate a right ventricular-pleural shunt catheter. There appears to be slightly increased right pleural fluid with compressive atelectasis. The right pleural effusion is moderate-to-large in size. The left lung remains clear. Trachea is midline. Heart size is stable.  IMPRESSION: There is a moderate-to-large sized right pleural effusion which has slightly enlarged since 05/11/2014.  Right ventricular-pleural shunt.   Electronically Signed   By: Richarda Overlie M.D.   On: 05/12/2014 17:14   Dg Chest 2 View  05/11/2014   CLINICAL DATA:  Cough and decreased breath sounds on the right  EXAM: CHEST  2 VIEW  COMPARISON:  Chest radiograph December 30, 2013 and chest CT December 30, 2013  FINDINGS: There is a persistent pleural effusion on the right which may be slightly increased. There is atelectasis and possible  consolidation in the right middle and lower lobes. The left lung is clear. Heart size and pulmonary vascularity are normal. No adenopathy. There is a shunt catheter on the right which appears coiled in the  right hemi thorax.  IMPRESSION: Shunt catheter appears coiled in the right hemithorax. The sizable effusion on the right may at least in part be due to fluid collecting in the right chest from the shunt catheter. There is probable compressive atelectasis in the right middle and lower lobes. Left lung is clear.  These results will be called to the ordering clinician or representative by the Radiologist Assistant, and communication documented in the PACS or zVision Dashboard.   Electronically Signed   By: Bretta Bang M.D.   On: 05/11/2014 16:38   Dg Abd 1 View  05/12/2014   CLINICAL DATA:  Cough and congestion, fever, VP shunt  EXAM: ABDOMEN - 1 VIEW  COMPARISON:  None.  FINDINGS: The bowel gas pattern is normal. No radio-opaque calculi or other significant radiographic abnormality are seen. Mild stool volume. No VP shunt tubing is identified. IUD in place over the pelvis.  IMPRESSION: Negative.   Electronically Signed   By: Christiana Pellant M.D.   On: 05/12/2014 17:12   Ct Head Wo Contrast  05/12/2014   CLINICAL DATA:  Headache.  History of VP shunt.  EXAM: CT HEAD WITHOUT CONTRAST  TECHNIQUE: Contiguous axial images were obtained from the base of the skull through the vertex without intravenous contrast.  COMPARISON:  12/04/2013  FINDINGS: Stable position of the ventriculostomy catheter. Maximal Min ventricular diameter is stable at 4.2 cm. Stable prominent temporal horns. No extra-axial fluid collections are identified. The gray-white differentiation is maintained. No findings for hemispheric infarction an or intracranial hemorrhage. The brainstem and cerebellum are grossly normal and stable.  No significant bony findings.  IMPRESSION: Stable ventriculostomy shunt catheter.  Stable appearance and size of the ventricles. No mass effect or shift.  No acute intracranial findings.   Electronically Signed   By: Loralie Champagne M.D.   On: 05/12/2014 17:01   Ct Chest W Contrast  05/12/2014   CLINICAL DATA:   Progressive right pleural effusion. Ventricular shunt  EXAM: CT CHEST WITH CONTRAST  TECHNIQUE: Multidetector CT imaging of the chest was performed during intravenous contrast administration.  CONTRAST:  75mL OMNIPAQUE IOHEXOL 300 MG/ML  SOLN  COMPARISON:  CT chest 12/30/2013  FINDINGS: Ventricular pleural shunt is present with the shunt tubing terminating in the pleural space on the right posteriorly, unchanged from the prior study. Moderate right pleural effusion has progressed. There is increased loculated fluid anteriorly in the base on the right. Progression of right lower lobe atelectasis.  Negative for pleural effusion on the left. No mass or adenopathy is detected.  The aortic arch is normal.  Pulmonary arteries opacified normally.  IMPRESSION: Mild increase in right pleural effusion with a loculated component anteriorly. Increase in right lower lobe compressive atelectasis.  Left lung remains clear.   Electronically Signed   By: Marlan Palau M.D.   On: 05/12/2014 21:02   Dg Chest Port 1 View  05/12/2014   CLINICAL DATA:  Post thoracentesis  EXAM: PORTABLE CHEST - 1 VIEW  COMPARISON:  05/12/2014  FINDINGS: There is small to moderate right pleural effusion with right basilar atelectasis or infiltrate. No pulmonary edema. No pneumothorax. Right ventricular pleural catheter is stable in position.  IMPRESSION: Small to moderate right pleural effusion with right basilar atelectasis or infiltrate. No pulmonary edema. No pneumothorax.  Electronically Signed   By: Natasha MeadLiviu  Pop M.D.   On: 05/12/2014 20:01   [place] Thoracentesis Asp Pleural Space W/img Guide  05/15/2014   CLINICAL DATA:  Right pleural effusion. Right ventricular pleural shunt.  EXAM: ULTRASOUND GUIDED RIGHT THORACENTESIS  COMPARISON:  CT 05/12/2014  PROCEDURE: An ultrasound guided thoracentesis was thoroughly discussed with the patient and questions answered. The benefits, risks, alternatives and complications were also discussed. The patient  understands and wishes to proceed with the procedure. Written consent was obtained.  Ultrasound was performed to localize and mark an adequate pocket of fluid in the right posterior chest. The area was then prepped and draped in the normal sterile fashion. 1% Lidocaine was used for local anesthesia. Under ultrasound guidance a 19 gauge Yueh catheter was introduced. Thoracentesis was performed. The catheter was removed and a dressing applied.  Complications:  None.  FINDINGS: A total of approximately 600 mL of yellow fluid was removed. A fluid sample wassent for laboratory analysis. The right pleural fluid is complex with septations.  IMPRESSION: Successful ultrasound guided right thoracentesis yielding 600 ml of pleural fluid.   Electronically Signed   By: Richarda OverlieAdam  Henn M.D.   On: 05/15/2014 16:30         Subjective: Patient continues to have intermittent nonproductive cough. Denies fevers, chills, chest pain, shortness breath, nausea, vomiting, diarrhea, abdominal pain, dysuria, hematuria. No rashes.  Objective: Filed Vitals:   05/15/14 1602 05/15/14 1609 05/15/14 2115 05/16/14 0459  BP: 108/73 123/72 111/73 103/70  Pulse:   95 76  Temp:   98.2 F (36.8 C) 98.4 F (36.9 C)  TempSrc:   Oral Oral  Resp:   15 15  Height:      Weight:      SpO2: 96% 96% 96% 95%    Intake/Output Summary (Last 24 hours) at 05/16/14 1356 Last data filed at 05/15/14 2117  Gross per 24 hour  Intake      3 ml  Output      0 ml  Net      3 ml   Weight change:  Exam:   General:  Pt is alert, follows commands appropriately, not in acute distress  HEENT: No icterus, No thrush, No neck mass, [place]/AT  Cardiovascular: RRR, S1/S2, no rubs, no gallops  Respiratory: Basilar crackles--right. Left clear to auscultation   Abdomen: Soft/+BS, non tender, non distended, no guarding  Extremities: No edema, No lymphangitis, No petechiae, No rashes, no synovitis  Data Reviewed: Basic Metabolic Panel:  Recent  Labs Lab 05/12/14 1617 05/13/14 0753 05/16/14 0526  NA 142 141 141  K 3.5* 3.6* 4.0  CL 105 105 104  CO2 26 25 26   GLUCOSE 92 80 99  BUN 12 6 11   CREATININE 0.62 0.60 0.69  CALCIUM 8.5 8.5 8.8   Liver Function Tests:  Recent Labs Lab 05/13/14 0753 05/15/14 1835  AST 13 17  ALT 14 19  ALKPHOS 79 84  BILITOT 0.9 0.3  PROT 6.7 7.3  ALBUMIN 2.7* 2.9*   No results found for this basename: LIPASE, AMYLASE,  in the last 168 hours No results found for this basename: AMMONIA,  in the last 168 hours CBC:  Recent Labs Lab 05/11/14 1627 05/12/14 1617 05/13/14 0727 05/14/14 0529 05/16/14 0526  WBC 8.2 6.9 5.9 10.6* 7.7  NEUTROABS  --  4.6  --   --   --   HGB 12.8 11.9* 12.3 13.1 12.5  HCT 38.9 37.0 38.4 40.3 38.6  MCV 83.9  85.3 85.7 85.4 83.4  PLT  --  323 319 344 354   Cardiac Enzymes: No results found for this basename: CKTOTAL, CKMB, CKMBINDEX, TROPONINI,  in the last 168 hours BNP: No components found with this basename: POCBNP,  CBG: No results found for this basename: GLUCAP,  in the last 168 hours  Recent Results (from the past 240 hour(s))  BODY FLUID CULTURE     Status: None   Collection Time    05/12/14  7:50 PM      Result Value Ref Range Status   Specimen Description PLEURAL FLUID RIGHT   Final   Special Requests NONE   Final   Gram Stain     Final   Value: RARE WBC PRESENT,BOTH PMN AND MONONUCLEAR     NO ORGANISMS SEEN     Performed at [facility]   Culture     Final   Value: NO GROWTH 2 DAYS     Performed at [facility]   Report Status PENDING   Incomplete  AFB CULTURE WITH SMEAR     Status: None   Collection Time    05/12/14  7:50 PM      Result Value Ref Range Status   Specimen Description PLEURAL FLUID RIGHT   Final   Special Requests NONE   Final   Acid Fast Smear     Final   Value: NO ACID FAST BACILLI SEEN     Performed at [facility]   Culture     Final   Value: CULTURE WILL BE EXAMINED FOR 6 WEEKS BEFORE  ISSUING A FINAL REPORT     Performed at [facility]   Report Status PENDING   Incomplete  FUNGUS CULTURE W SMEAR     Status: None   Collection Time    05/12/14  7:50 PM      Result Value Ref Range Status   Specimen Description PLEURAL FLUID RIGHT   Final   Special Requests NONE   Final   Fungal Smear     Final   Value: NO YEAST OR FUNGAL ELEMENTS SEEN     Performed at [facility]   Culture     Final   Value: CULTURE IN PROGRESS FOR FOUR WEEKS     Performed at [facility]   Report Status PENDING   Incomplete  CULTURE, BLOOD (ROUTINE X 2)     Status: None   Collection Time    05/12/14  8:15 PM      Result Value Ref Range Status   Specimen Description BLOOD RIGHT ARM   Final   Special Requests BOTTLES DRAWN AEROBIC AND ANAEROBIC 6CC   Final   Culture  Setup Time     Final   Value: 05/13/2014 00:24     Performed at [facility]   Culture     Final   Value:        BLOOD CULTURE RECEIVED NO GROWTH TO DATE CULTURE WILL BE HELD FOR 5 DAYS BEFORE ISSUING A FINAL NEGATIVE REPORT     Performed at [facility]   Report Status PENDING   Incomplete  CULTURE, BLOOD (ROUTINE X 2)     Status: None   Collection Time    05/12/14  8:20 PM      Result Value Ref Range Status   Specimen Description BLOOD RIGHT HAND   Final   Special Requests BOTTLES DRAWN AEROBIC ONLY 10CC   Final  Culture  Setup Time     Final   Value: 05/13/2014 00:23     Performed at [facility]   Culture     Final   Value:        BLOOD CULTURE RECEIVED NO GROWTH TO DATE CULTURE WILL BE HELD FOR 5 DAYS BEFORE ISSUING A FINAL NEGATIVE REPORT     Performed at [facility]   Report Status PENDING   Incomplete  CSF CULTURE     Status: None   Collection Time    05/14/14  3:04 PM      Result Value Ref Range Status   Specimen Description CSF   Final   Special Requests NONE   Final   Gram Stain     Final   Value: WBC PRESENT, PREDOMINANTLY MONONUCLEAR     NO ORGANISMS  SEEN     CYTOSPIN Performed at [facility]     Performed at [facility]   Culture     Final   Value: NO GROWTH 1 DAY     Performed at [facility]   Report Status PENDING   Incomplete  GRAM STAIN     Status: None   Collection Time    05/14/14  3:04 PM      Result Value Ref Range Status   Specimen Description CSF   Final   Special Requests NONE   Final   Gram Stain     Final   Value: CYTOSPIN PREP     WBC PRESENT, PREDOMINANTLY MONONUCLEAR     NO ORGANISMS SEEN   Report Status 05/14/2014 FINAL   Final  AFB CULTURE WITH SMEAR     Status: None   Collection Time    05/15/14  4:10 PM      Result Value Ref Range Status   Specimen Description PLEURAL RIGHT FLUID   Final   Special Requests NONE   Final   Acid Fast Smear     Final   Value: NO ACID FAST BACILLI SEEN     Performed at [facility]   Culture     Final   Value: CULTURE WILL BE EXAMINED FOR 6 WEEKS BEFORE ISSUING A FINAL REPORT     Performed at [facility]   Report Status PENDING   Incomplete  FUNGUS CULTURE W SMEAR     Status: None   Collection Time    05/15/14  4:10 PM      Result Value Ref Range Status   Specimen Description PLEURAL RIGHT   Final   Special Requests NONE   Final   Fungal Smear     Final   Value: NO YEAST OR FUNGAL ELEMENTS SEEN     Performed at [facility]   Culture     Final   Value: CULTURE IN PROGRESS FOR FOUR WEEKS     Performed at [facility]   Report Status PENDING   Incomplete  BODY FLUID CULTURE     Status: None   Collection Time    05/15/14  4:10 PM      Result Value Ref Range Status   Specimen Description PLEURAL RIGHT FLUID   Final   Special Requests NONE   Final   Gram Stain     Final   Value: MODERATE WBC PRESENT,BOTH PMN AND MONONUCLEAR     NO ORGANISMS SEEN     Performed at [facility]   Culture PENDING   Incomplete  Report Status PENDING   Incomplete     Scheduled Meds: . heparin  5,000 Units  Subcutaneous 3 times per day  . senna  1 tablet Oral BID  . sodium chloride  3 mL Intravenous Q12H   Continuous Infusions:    Shailee Foots, DO  [facility] Pager [phone]  If 7PM-7AM, please contact night-coverage [email or web address] Password [email or web address] 05/16/2014, 1:56 PM   LOS: 4 days

## 2014-05-16 NOTE — Progress Notes (Signed)
Patient ID: Jed Limerick, female   DOB: June 06, 1976, 38 y.o.   MRN: [id number] Subjective:  The patient is alert and pleasant. Her husband, and her female friend, are at her bedside. She denies headache. She tolerated the thoracocentesis well.  Objective: Vital signs in last 24 hours: Temp:  [97.7 F (36.5 C)-98.4 F (36.9 C)] 98.4 F (36.9 C) (08/09 0459) Pulse Rate:  [76-96] 76 (08/09 0459) Resp:  [15-18] 15 (08/09 0459) BP: (103-123)/(68-76) 103/70 mmHg (08/09 0459) SpO2:  [95 %-97 %] 95 % (08/09 0459)  Intake/Output from previous day: 08/08 0701 - 08/09 0700 In: 3 [I.V.:3] Out: -  Intake/Output this shift:    Physical exam the patient is alert and oriented x3. She looks well. She is moving all 4 extremities well.  Lab Results:  Recent Labs  05/14/14 0529 05/16/14 0526  WBC 10.6* 7.7  HGB 13.1 12.5  HCT 40.3 38.6  PLT 344 354   BMET  Recent Labs  05/16/14 0526  NA 141  K 4.0  CL 104  CO2 26  GLUCOSE 99  BUN 11  CREATININE 0.69  CALCIUM 8.8    Studies/Results: Dg Chest 1 View  05/15/2014   CLINICAL DATA:  Post right thoracentesis  EXAM: CHEST - 1 VIEW  COMPARISON:  05/14/2014  FINDINGS: No pneumothorax status post right thoracentesis.  Moderate right pleural effusion. Right pleural drain. Associated right lower lobe opacity, likely compressive atelectasis.  Left lung is clear.  The heart is normal in size.  IMPRESSION: No pneumothorax status post right thoracentesis.  Moderate right pleural effusion with right pleural drain.   Electronically Signed   By: Charline Bills M.D.   On: 05/15/2014 16:36   Dg Chest 2 View  05/14/2014   CLINICAL DATA:  38 year old female with cough and pleural effusion. Ventriculoperitoneal shunt.  EXAM: CHEST  2 VIEW  COMPARISON:  05/12/2014 and prior radiographs and CTs  FINDINGS: A moderate right pleural effusion is unchanged with right lower lung atelectasis.  The left lung is clear.  The cardiomediastinal silhouette is stable.   A ventriculoperitoneal shunt is again identified overlying the right chest with tip overlying the lower right hemithorax. There is no evidence of pneumothorax.  IMPRESSION: Unchanged moderate right pleural effusion with right basilar atelectasis. Unchanged position of ventriculoperitoneal shunt.   Electronically Signed   By: Laveda Abbe M.D.   On: 05/14/2014 20:22   US Thoracentesis Asp Pleural Space W/img Guide  05/15/2014   CLINICAL DATA:  Right pleural effusion. Right ventricular pleural shunt.  EXAM: ULTRASOUND GUIDED RIGHT THORACENTESIS  COMPARISON:  CT 05/12/2014  PROCEDURE: An ultrasound guided thoracentesis was thoroughly discussed with the patient and questions answered. The benefits, risks, alternatives and complications were also discussed. The patient understands and wishes to proceed with the procedure. Written consent was obtained.  Ultrasound was performed to localize and mark an adequate pocket of fluid in the right posterior chest. The area was then prepped and draped in the normal sterile fashion. 1% Lidocaine was used for local anesthesia. Under ultrasound guidance a 19 gauge Yueh catheter was introduced. Thoracentesis was performed. The catheter was removed and a dressing applied.  Complications:  None.  FINDINGS: A total of approximately 600 mL of yellow fluid was removed. A fluid sample wassent for laboratory analysis. The right pleural fluid is complex with septations.  IMPRESSION: Successful ultrasound guided right thoracentesis yielding 600 ml of pleural fluid.   Electronically Signed   By: Richarda Overlie M.D.   On: 05/15/2014  16:30    Assessment/Plan: Right pleural effusion, ventriculo pleural shunt: I have again discussed situation with the patient, her husband, and her friend. I have again explained to them that I feel she likely has a partial distal shunt malfunction as her ventricles are mildly enlarged, her shunt tap on 05/14/2014 demonstrated good proximal flow of cerebral spinal  fluid, and she has a persistently enlarging pleural effusion. I also feel that she is likely shunt-dependent as her ventricles are mildly enlarged when compared to prior CT scans and what her shunt stops working she become symptomatic with worsening headaches. Therefore I don't feel it would be a good idea to remove her shunt.  The present issue is whether the distal shunt malfunction is caused by aseptic poor absorption in her chest versus poor absorption caused by an infection or some other cause. I will leave it up to infectious diseases expertise whether we should treat her empirically with antibiotics prior to a shunt revision.  I have told her that I think we should revise her shunt preferably to a ventriculoperitoneal shunt(although this may not be possible because of her multiple previous shunt malfunctions and infections.). Alternatively we could revise to a left-sided ventriculopleural shunt, or as a last resort to a ventriculoatrial shunt(I would like to avoid this if possible). I have told them that I couldn't possibly do the surgery either Wednesday or Thursday, depending on the availability of the general surgeon. The patient [facility]. I have explained to the patient and her family that of course she is free to go back to this neurosurgeon if she wants.  If, in the opinion of infectious disease, the patient does not need to be treated empirically with antibiotics, the patient can be discharged and we can arrange his surgery as an outpatient from my point of view. I have answered all the patient's, and her family's, questions.  LOS: 4 days     Yuvonne Lanahan D 05/16/2014, 10:36 AM

## 2014-05-16 NOTE — Progress Notes (Signed)
ANTIBIOTIC CONSULT NOTE - INITIAL  Pharmacy Consult for Vancomycin Indication: R/o pleural space/VP shunt infection  No Known Allergies  Patient Measurements: Height: 5\' 6"  (167.6 cm) Weight: 143 lb 4.8 oz (65 kg) IBW/kg (Calculated) : 59.3  Vital Signs: Temp: 98.3 F (36.8 C) (08/09 1421) Temp src: Oral (08/09 1421) BP: 107/64 mmHg (08/09 1421) Pulse Rate: 88 (08/09 1421) Intake/Output from previous day: 08/08 0701 - 08/09 0700 In: 3 [I.V.:3] Out: -  Intake/Output from this shift:    Labs:  Recent Labs  05/14/14 0529 05/16/14 0526  WBC 10.6* 7.7  HGB 13.1 12.5  PLT 344 354  CREATININE  --  0.69   Estimated Creatinine Clearance: 87.5 ml/min (by C-G formula based on Cr of 0.69). No results found for this basename: VANCOTROUGH, VANCOPEAK, VANCORANDOM, GENTTROUGH, GENTPEAK, GENTRANDOM, TOBRATROUGH, TOBRAPEAK, TOBRARND, AMIKACINPEAK, AMIKACINTROU, AMIKACIN,  in the last 72 hours   Microbiology: Recent Results (from the past 720 hour(s))  BODY FLUID CULTURE     Status: None   Collection Time    05/12/14  7:50 PM      Result Value Ref Range Status   Specimen Description PLEURAL FLUID RIGHT   Final   Special Requests NONE   Final   Gram Stain     Final   Value: RARE WBC PRESENT,BOTH PMN AND MONONUCLEAR     NO ORGANISMS SEEN     [facility]formed at [facility]   Culture     Final   Value: NO GROWTH 3 DAYS     Performed at [facility]   Report Status 05/16/2014 FINAL   Final  AFB CULTURE WITH SMEAR     Status: None   Collection Time    05/12/14  7:50 PM      Result Value Ref Range Status   Specimen Description PLEURAL FLUID RIGHT   Final   Special Requests NONE   Final   Acid Fast Smear     Final   Value: NO ACID FAST BACILLI SEEN     Performed at [facility]   Culture     Final   Value: CULTURE WILL BE EXAMINED FOR 6 WEEKS BEFORE ISSUING A FINAL REPORT     Performed at [facility]   Report Status PENDING   Incomplete  FUNGUS  CULTURE W SMEAR     Status: None   Collection Time    05/12/14  7:50 PM      Result Value Ref Range Status   Specimen Description PLEURAL FLUID RIGHT   Final   Special Requests NONE   Final   Fungal Smear     Final   Value: NO YEAST OR FUNGAL ELEMENTS SEEN     Performed at [facility]   Culture     Final   Value: CULTURE IN PROGRESS FOR FOUR WEEKS     Performed at [facility]   Report Status PENDING   Incomplete  CULTURE, BLOOD (ROUTINE X 2)     Status: None   Collection Time    05/12/14  8:15 PM      Result Value Ref Range Status   Specimen Description BLOOD RIGHT ARM   Final   Special Requests BOTTLES DRAWN AEROBIC AND ANAEROBIC [facility]   Final   Culture  Setup Time     Final   Value: 05/13/2014 00:24     Performed at [facility]   Culture     Final   Value:  BLOOD CULTURE RECEIVED NO GROWTH TO DATE CULTURE WILL BE HELD FOR 5 DAYS BEFORE ISSUING A FINAL NEGATIVE REPORT     Performed at [facility]   Report Status PENDING   Incomplete  CULTURE, BLOOD (ROUTINE X 2)     Status: None   Collection Time    05/12/14  8:20 PM      Result Value Ref Range Status   Specimen Description BLOOD RIGHT HAND   Final   Special Requests BOTTLES DRAWN AEROBIC ONLY 10CC   Final   Culture  Setup Time     Final   Value: 05/13/2014 00:23     Performed at [facility]   Culture     Final   Value:        BLOOD CULTURE RECEIVED NO GROWTH TO DATE CULTURE WILL BE HELD FOR 5 DAYS BEFORE ISSUING A FINAL NEGATIVE REPORT     Performed at [facility]   Report Status PENDING   Incomplete  CSF CULTURE     Status: None   Collection Time    05/14/14  3:04 PM      Result Value Ref Range Status   Specimen Description CSF   Final   Special Requests NONE   Final   Gram Stain     Final   Value: WBC PRESENT, PREDOMINANTLY MONONUCLEAR     NO ORGANISMS SEEN     CYTOSPIN Performed at [facility]     Performed at [facility]   Culture      Final   Value: NO GROWTH 2 DAYS     Performed at [facility]   Report Status PENDING   Incomplete  GRAM STAIN     Status: None   Collection Time    05/14/14  3:04 PM      Result Value Ref Range Status   Specimen Description CSF   Final   Special Requests NONE   Final   Gram Stain     Final   Value: CYTOSPIN PREP     WBC PRESENT, PREDOMINANTLY MONONUCLEAR     NO ORGANISMS SEEN   Report Status 05/14/2014 FINAL   Final  AFB CULTURE WITH SMEAR     Status: None   Collection Time    05/15/14  4:10 PM      Result Value Ref Range Status   Specimen Description PLEURAL RIGHT FLUID   Final   Special Requests NONE   Final   Acid Fast Smear     Final   Value: NO ACID FAST BACILLI SEEN     Performed at [facility]   Culture     Final   Value: CULTURE WILL BE EXAMINED FOR 6 WEEKS BEFORE ISSUING A FINAL REPORT     Performed at [facility]   Report Status PENDING   Incomplete  FUNGUS CULTURE W SMEAR     Status: None   Collection Time    05/15/14  4:10 PM      Result Value Ref Range Status   Specimen Description PLEURAL RIGHT   Final   Special Requests NONE   Final   Fungal Smear     Final   Value: NO YEAST OR FUNGAL ELEMENTS SEEN     Performed at [facility]   Culture     Final   Value: CULTURE IN PROGRESS FOR FOUR WEEKS     Performed at [facility]   Report Status  PENDING   Incomplete  BODY FLUID CULTURE     Status: None   Collection Time    05/15/14  4:10 PM      Result Value Ref Range Status   Specimen Description PLEURAL RIGHT FLUID   Final   Special Requests NONE   Final   Gram Stain     Final   Value: MODERATE WBC PRESENT,BOTH PMN AND MONONUCLEAR     NO ORGANISMS SEEN     Performed at [facility]   Culture     Final   Value: NO GROWTH     Performed at [facility]   Report Status PENDING   Incomplete    Medical History: Past Medical History  Diagnosis Date  . VP (ventriculoperitoneal) shunt status   .  Chronic headaches   . Chronic cough   . 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 Ventriculopleural shunt status     Assessment: 89 YOF with history of hydrocephalus with ventriculopleural shunt (revised in Feb '15) who presented on 8/5 with c/o HA, cough, and fever. CXR showed R-sided pleural effusion - s/p thoracentesis on 8/5 by CCM with 1500 cc of fluid obtained and sent for culturing. Fluid noted to be exudative in nature concerning for VP shunt infection. Antibiotic therapy was held initially pending cultures. ID still concerned for shunt infection and pharmacy was consulted to start Vancomycin along with Rocephin + Flagyl per MD for r/o pleural space/VP shunt infection. Wt: 65 kg, SCr 0.69, CrCl~80-90 ml/min.  Goal of Therapy:  Vancomycin trough level 15-20 mcg/ml  Plan:  1. Vancomycin 750 mg IV every 8 hours 2. Will continue to follow renal function, culture results, LOT, and antibiotic de-escalation plans   Georgina Pillion, PharmD, BCPS Clinical Pharmacist Pager: [phone] 05/16/2014 5:52 PM

## 2014-05-16 NOTE — Progress Notes (Signed)
[facility]    Subjective: Feels better after thoracocentesis yesterday   Antibiotics:  Anti-infectives   Start     Dose/Rate Route Frequency Ordered Stop   05/16/14 2200  cefTRIAXone (ROCEPHIN) 2 g in dextrose 5 % 50 mL IVPB     2 g 100 mL/hr over 30 Minutes Intravenous Every 12 hours 05/16/14 1721     05/16/14 1730  metroNIDAZOLE (FLAGYL) tablet 500 mg     500 mg Oral 3 times per day 05/16/14 1721        Medications: Scheduled Meds: . cefTRIAXone (ROCEPHIN)  IV  2 g Intravenous Q12H  . heparin  5,000 Units Subcutaneous 3 times per day  . metroNIDAZOLE  500 mg Oral 3 times per day  . senna  1 tablet Oral BID  . sodium chloride  3 mL Intravenous Q12H   Continuous Infusions:  PRN Meds:.sodium chloride, acetaminophen, acetaminophen, albuterol, alum & mag hydroxide-simeth, diphenhydrAMINE, HYDROcodone-homatropine, morphine injection, ondansetron (ZOFRAN) IV, ondansetron, oxyCODONE, sodium chloride    Objective: Weight change:   Intake/Output Summary (Last 24 hours) at 05/16/14 1723 Last data filed at 05/15/14 2117  Gross per 24 hour  Intake      3 ml  Output      0 ml  Net      3 ml   Blood pressure 107/64, pulse 88, temperature 98.3 F (36.8 C), temperature source Oral, resp. rate 16, height 5\' 6"  (1.676 m), weight 143 lb 4.8 oz (65 kg), last menstrual period 04/27/2014, SpO2 98.00%. Temp:  [98.2 F (36.8 C)-98.4 F (36.9 C)] 98.3 F (36.8 C) (08/09 1421) Pulse Rate:  [76-95] 88 (08/09 1421) Resp:  [15-16] 16 (08/09 1421) BP: (103-111)/(64-73) 107/64 mmHg (08/09 1421) SpO2:  [95 %-98 %] 98 % (08/09 1421)  Physical Exam: General: Alert and awake, oriented x3, not in any acute distress. HEENT: anicteric sclera, pupils reactive to light and accommodation, EOMI CVS regular rate, normal r,  no murmur rubs or gallops Chest: clear to auscultation bilaterally, no wheezing, rales or rhonchi Abdomen: soft nontender, nondistended, normal  bowel sounds, Skin: no rashes Neuro: nonfocal  CBC:  Recent Labs Lab 05/11/14 1627 05/12/14 1617 05/13/14 0727 05/14/14 0529 05/16/14 0526  HGB 12.8 11.9* 12.3 13.1 12.5  HCT 38.9 37.0 38.4 40.3 38.6  PLT  --  323 319 344 354     BMET  Recent Labs  05/16/14 0526  NA 141  K 4.0  CL 104  CO2 26  GLUCOSE 99  BUN 11  CREATININE 0.69  CALCIUM 8.8     Liver Panel   Recent Labs  05/15/14 1835  PROT 7.3  ALBUMIN 2.9*  AST 17  ALT 19  ALKPHOS 84  BILITOT 0.3  BILIDIR <0.2  IBILI NOT CALCULATED       Sedimentation Rate No results found for this basename: ESRSEDRATE,  in the last 72 hours C-Reactive Protein No results found for this basename: CRP,  in the last 72 hours  Micro Results: Recent Results (from the past 720 hour(s))  BODY FLUID CULTURE     Status: None   Collection Time    05/12/14  7:50 PM      Result Value Ref Range Status   Specimen Description PLEURAL FLUID RIGHT   Final   Special Requests NONE   Final   Gram Stain     Final   Value: RARE WBC PRESENT,BOTH PMN AND MONONUCLEAR     NO ORGANISMS SEEN  Performed at [facility]   Culture     Final   Value: NO GROWTH 3 DAYS     Performed at [facility]   Report Status 05/16/2014 FINAL   Final  AFB CULTURE WITH SMEAR     Status: None   Collection Time    05/12/14  7:50 PM      Result Value Ref Range Status   Specimen Description PLEURAL FLUID RIGHT   Final   Special Requests NONE   Final   Acid Fast Smear     Final   Value: NO ACID FAST BACILLI SEEN     Performed at [facility]   Culture     Final   Value: CULTURE WILL BE EXAMINED FOR 6 WEEKS BEFORE ISSUING A FINAL REPORT     Performed at [facility]   Report Status PENDING   Incomplete  FUNGUS CULTURE W SMEAR     Status: None   Collection Time    05/12/14  7:50 PM      Result Value Ref Range Status   Specimen Description PLEURAL FLUID RIGHT   Final   Special Requests NONE   Final   Fungal  Smear     Final   Value: NO YEAST OR FUNGAL ELEMENTS SEEN     Performed at [facility]   Culture     Final   Value: CULTURE IN PROGRESS FOR FOUR WEEKS     Performed at [facility]   Report Status PENDING   Incomplete  CULTURE, BLOOD (ROUTINE X 2)     Status: None   Collection Time    05/12/14  8:15 PM      Result Value Ref Range Status   Specimen Description BLOOD RIGHT ARM   Final   Special Requests BOTTLES DRAWN AEROBIC AND ANAEROBIC 6CC   Final   Culture  Setup Time     Final   Value: 05/13/2014 00:24     Performed at [facility]   Culture     Final   Value:        BLOOD CULTURE RECEIVED NO GROWTH TO DATE CULTURE WILL BE HELD FOR 5 DAYS BEFORE ISSUING A FINAL NEGATIVE REPORT     Performed at [facility]   Report Status PENDING   Incomplete  CULTURE, BLOOD (ROUTINE X 2)     Status: None   Collection Time    05/12/14  8:20 PM      Result Value Ref Range Status   Specimen Description BLOOD RIGHT HAND   Final   Special Requests BOTTLES DRAWN AEROBIC ONLY 10CC   Final   Culture  Setup Time     Final   Value: 05/13/2014 00:23     Performed at [facility]   Culture     Final   Value:        BLOOD CULTURE RECEIVED NO GROWTH TO DATE CULTURE WILL BE HELD FOR 5 DAYS BEFORE ISSUING A FINAL NEGATIVE REPORT     Performed at [facility]   Report Status PENDING   Incomplete  CSF CULTURE     Status: None   Collection Time    05/14/14  3:04 PM      Result Value Ref Range Status   Specimen Description CSF   Final   Special Requests NONE   Final   Gram Stain     Final   Value: WBC PRESENT, PREDOMINANTLY MONONUCLEAR  NO ORGANISMS SEEN     CYTOSPIN Performed at [facility]     Performed at [facility]   Culture     Final   Value: NO GROWTH 2 DAYS     Performed at [facility]   Report Status PENDING   Incomplete  GRAM STAIN     Status: None   Collection Time    05/14/14  3:04 PM      Result Value Ref  Range Status   Specimen Description CSF   Final   Special Requests NONE   Final   Gram Stain     Final   Value: CYTOSPIN PREP     WBC PRESENT, PREDOMINANTLY MONONUCLEAR     NO ORGANISMS SEEN   Report Status 05/14/2014 FINAL   Final  AFB CULTURE WITH SMEAR     Status: None   Collection Time    05/15/14  4:10 PM      Result Value Ref Range Status   Specimen Description PLEURAL RIGHT FLUID   Final   Special Requests NONE   Final   Acid Fast Smear     Final   Value: NO ACID FAST BACILLI SEEN     Performed at [facility]   Culture     Final   Value: CULTURE WILL BE EXAMINED FOR 6 WEEKS BEFORE ISSUING A FINAL REPORT     Performed at [facility]   Report Status PENDING   Incomplete  FUNGUS CULTURE W SMEAR     Status: None   Collection Time    05/15/14  4:10 PM      Result Value Ref Range Status   Specimen Description PLEURAL RIGHT   Final   Special Requests NONE   Final   Fungal Smear     Final   Value: NO YEAST OR FUNGAL ELEMENTS SEEN     Performed at [facility]   Culture     Final   Value: CULTURE IN PROGRESS FOR FOUR WEEKS     Performed at [facility]   Report Status PENDING   Incomplete  BODY FLUID CULTURE     Status: None   Collection Time    05/15/14  4:10 PM      Result Value Ref Range Status   Specimen Description PLEURAL RIGHT FLUID   Final   Special Requests NONE   Final   Gram Stain     Final   Value: MODERATE WBC PRESENT,BOTH PMN AND MONONUCLEAR     NO ORGANISMS SEEN     Performed at [facility]   Culture     Final   Value: NO GROWTH     Performed at [facility]   Report Status PENDING   Incomplete    Studies/Results: Dg Chest 1 View  05/15/2014   CLINICAL DATA:  Post right thoracentesis  EXAM: CHEST - 1 VIEW  COMPARISON:  05/14/2014  FINDINGS: No pneumothorax status post right thoracentesis.  Moderate right pleural effusion. Right pleural drain. Associated right lower lobe opacity, likely compressive  atelectasis.  Left lung is clear.  The heart is normal in size.  IMPRESSION: No pneumothorax status post right thoracentesis.  Moderate right pleural effusion with right pleural drain.   Electronically Signed   By: Charline Bills M.D.   On: 05/15/2014 16:36   Dg Chest 2 View  05/14/2014   CLINICAL DATA:  38 year old female with cough and pleural effusion. Ventriculoperitoneal shunt.  EXAM: CHEST  2 VIEW  COMPARISON:  05/12/2014 and prior radiographs and CTs  FINDINGS: A moderate right pleural effusion is unchanged with right lower lung atelectasis.  The left lung is clear.  The cardiomediastinal silhouette is stable.  A ventriculoperitoneal shunt is again identified overlying the right chest with tip overlying the lower right hemithorax. There is no evidence of pneumothorax.  IMPRESSION: Unchanged moderate right pleural effusion with right basilar atelectasis. Unchanged position of ventriculoperitoneal shunt.   Electronically Signed   By: Laveda Abbe M.D.   On: 05/14/2014 20:22   US Thoracentesis Asp Pleural Space W/img Guide  05/15/2014   CLINICAL DATA:  Right pleural effusion. Right ventricular pleural shunt.  EXAM: ULTRASOUND GUIDED RIGHT THORACENTESIS  COMPARISON:  CT 05/12/2014  PROCEDURE: An ultrasound guided thoracentesis was thoroughly discussed with the patient and questions answered. The benefits, risks, alternatives and complications were also discussed. The patient understands and wishes to proceed with the procedure. Written consent was obtained.  Ultrasound was performed to localize and mark an adequate pocket of fluid in the right posterior chest. The area was then prepped and draped in the normal sterile fashion. 1% Lidocaine was used for local anesthesia. Under ultrasound guidance a 19 gauge Yueh catheter was introduced. Thoracentesis was performed. The catheter was removed and a dressing applied.  Complications:  None.  FINDINGS: A total of approximately 600 mL of yellow fluid was removed. A  fluid sample wassent for laboratory analysis. The right pleural fluid is complex with septations.  IMPRESSION: Successful ultrasound guided right thoracentesis yielding 600 ml of pleural fluid.   Electronically Signed   By: Richarda Overlie M.D.   On: 05/15/2014 16:30      Assessment/Plan:  Active Problems:   Pleural effusion, right   Fever   Headache    [name] is a 38 y.o. 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 Patient underwent aspiration of her shunt with very bland CSF and only 1 white blood cell seen. Her pleural fluid cultures are negative to date including bacterial fungal and acid-fast bacilli cultures and her cultures from her CSF are pending she is afebrile off antibiotics.  #1 Large pleural effusion which persists after drainage :   The fluid count is not terribly high and not highly suggestive of a bacterial infection.   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 SHUNT INFECTION: THE SHUNT ITSELF DOES NOT APPEAR OVERTLY INFECTED AND CSF IS BENIGN--HOWEVER THE PLEURAL SPACE HAS CONSUMPTIVE PROCESS GOING ON AND SHUNT TERMINATES THERE SO WOULD BE BETTER TO ERR ON THE SIDE OF ASSUMING  INFECTION  #3 Screening: HIV negative and Hep panel pending      LOS: 4 days   Acey Lav 05/16/2014, 5:23 PM

## 2014-05-16 NOTE — Consult Note (Addendum)
Name: Lisa LimerickROSANNA Johns MRN: [id number] DOB: 10/08/1975    ADMISSION DATE:  05/12/2014 CONSULTATION DATE:  05/12/2014  REFERRING MD :  Samuel JesterKathleen McManus  CHIEF COMPLAINT:  Headache  BRIEF PATIENT DESCRIPTION: 38 yo female with hx of VP shunt presented with headache and dry cough.  Found to have large Rt pleural effusion.  PCCM consulted to assist with pleural effusion evaluation.  SIGNIFICANT EVENTS: 8/05 Admit for headache, R effusion  STUDIES:  8/05 Rt pleural fluid >>exudative x 1.5 liters 8/8 Retap on R by IR > 600 cc neg afb, fungus smears, mod wbc both pmn and monos,  Glucose < 20    SUBJECTIVE:   Better p R tap 8/7  VITAL SIGNS: Temp:  [97.7 F (36.5 C)-98.4 F (36.9 C)] 98.4 F (36.9 C) (08/09 0459) Pulse Rate:  [76-96] 76 (08/09 0459) Resp:  [15-18] 15 (08/09 0459) BP: (103-123)/(68-76) 103/70 mmHg (08/09 0459) SpO2:  [95 %-97 %] 95 % (08/09 0459)  PHYSICAL EXAMINATION: General: no distress, sitting in bed Neuro:  Alert, normal strength, CN intact HEENT:  Pupils reactive, no oral exudate, no LAN Cardiovascular: regular, no murmur Lungs:  Min Decreased breath sounds R base Abdomen:  Soft, non tender, normal bowel sounds Musculoskeletal:  No edema Skin:  No rashes  CBC Recent Labs     05/14/14  0529  05/16/14  0526  WBC  10.6*  7.7  HGB  13.1  12.5  HCT  40.3  38.6  PLT  344  354   BMET Recent Labs     05/16/14  0526  NA  141  K  4.0  CL  104  CO2  26  BUN  11  CREATININE  0.69  GLUCOSE  99    Electrolytes Recent Labs     05/16/14  0526  CALCIUM  8.8    Imaging Dg Chest 1 View  05/15/2014   CLINICAL DATA:  Post right thoracentesis  EXAM: CHEST - 1 VIEW  COMPARISON:  05/14/2014  FINDINGS: No pneumothorax status post right thoracentesis.  Moderate right pleural effusion. Right pleural drain. Associated right lower lobe opacity, likely compressive atelectasis.  Left lung is clear.  The heart is normal in size.  IMPRESSION: No pneumothorax  status post right thoracentesis.  Moderate right pleural effusion with right pleural drain.   Electronically Signed   By: Charline BillsSriyesh  Krishnan M.D.   On: 05/15/2014 16:36   Dg Chest 2 View  05/14/2014   CLINICAL DATA:  38 year old female with cough and pleural effusion. Ventriculoperitoneal shunt.  EXAM: CHEST  2 VIEW  COMPARISON:  05/12/2014 and prior radiographs and CTs  FINDINGS: A moderate right pleural effusion is unchanged with right lower lung atelectasis.  The left lung is clear.  The cardiomediastinal silhouette is stable.  A ventriculoperitoneal shunt is again identified overlying the right chest with tip overlying the lower right hemithorax. There is no evidence of pneumothorax.  IMPRESSION: Unchanged moderate right pleural effusion with right basilar atelectasis. Unchanged position of ventriculoperitoneal shunt.   Electronically Signed   By: Laveda AbbeJeff  Hu M.D.   On: 05/14/2014 20:22   [place] Thoracentesis Asp Pleural Space W/img Guide  05/15/2014   CLINICAL DATA:  Right pleural effusion. Right ventricular pleural shunt.  EXAM: ULTRASOUND GUIDED RIGHT THORACENTESIS  COMPARISON:  CT 05/12/2014  PROCEDURE: An ultrasound guided thoracentesis was thoroughly discussed with the patient and questions answered. The benefits, risks, alternatives and complications were also discussed. The patient understands and wishes to proceed with the procedure.  Written consent was obtained.  Ultrasound was performed to localize and mark an adequate pocket of fluid in the right posterior chest. The area was then prepped and draped in the normal sterile fashion. 1% Lidocaine was used for local anesthesia. Under ultrasound guidance a 19 gauge Yueh catheter was introduced. Thoracentesis was performed. The catheter was removed and a dressing applied.  Complications:  None.  FINDINGS: A total of approximately 600 mL of yellow fluid was removed. A fluid sample wassent for laboratory analysis. The right pleural fluid is complex with  septations.  IMPRESSION: Successful ultrasound guided right thoracentesis yielding 600 ml of pleural fluid.   Electronically Signed   By: Richarda Overlie M.D.   On: 05/15/2014 16:30       ASSESSMENT / PLAN:  38 y/o female with large Rt pleural effusion in setting of VP shunt draining into rt pleural space.  She has fairly recent onset of fever, headache, nausea/vomiting.  Rt exudative pleural effusion complex with septations  -Fluid consistent with exudate and glucose < 20  8/8  rec Follow fluid culture, narrow abx accordingly per ID - will likely ultimately  require VATS as suspect this is a late parapneumonic process with scarring of the R pleural space unrelated to vp shunt but resulting in dysfunction of the shunt  VP shunt. Plan:   evaluation by neurosurgery > replace shunt 8/12 Fever. Plan: Defer Abx to ID     Patient would not get off phone for interview / exam.  Pleural fluid consistent with exudative process.  Await cultures.  PCCM will be available PRN.    Sandrea Hughs, MD Pulmonary and Critical Care Medicine [facility] Cell [phone] After 5:30 PM or weekends, call [phone]  05/16/2014, 1:09 PM

## 2014-05-17 LAB — QUANTIFERON TB GOLD ASSAY (BLOOD)
Interferon Gamma Release Assay: NEGATIVE
Mitogen value: 9.67 [IU]/mL
Quantiferon Nil Value: 0.03 [IU]/mL
TB Ag value: 0.03 [IU]/mL
TB Antigen Minus Nil Value: 0 [IU]/mL

## 2014-05-17 LAB — PATHOLOGIST SMEAR REVIEW

## 2014-05-17 LAB — BETA 2 TRANSFERRIN: Beta-2 Transferrin, Fluid: 2

## 2014-05-17 MED ORDER — ENSURE COMPLETE PO LIQD
237.0000 mL | Freq: Two times a day (BID) | ORAL | Status: DC
  Administered 2014-05-17 – 2014-05-19 (×4): 237 mL via ORAL

## 2014-05-17 NOTE — Progress Notes (Signed)
[facility]    Date of Admission:  05/12/2014    Total days of antibiotics 2        Day 2 Rocephin        Day 2 Flagyl        Day 2 Vancomycin         Active Problems:   Pleural effusion, right   Fever   Headache   . cefTRIAXone (ROCEPHIN)  IV  2 g Intravenous Q12H  . heparin  5,000 Units Subcutaneous 3 times per day  . metroNIDAZOLE  500 mg Oral 3 times per day  . senna  1 tablet Oral BID  . sodium chloride  3 mL Intravenous Q12H  . vancomycin  750 mg Intravenous Q8H    Subjective: Denies headache or any discomfort today. In very good spirits, says she is feeling very well. Still w/ dry cough on exam.    Past Medical History  Diagnosis Date  . VP (ventriculoperitoneal) shunt status   . Chronic headaches   . Chronic cough   . 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 Ventriculopleural shunt status     History  Substance Use Topics  . Smoking status: Never Smoker   . Smokeless tobacco: Never Used  . Alcohol Use: No    Family History  Problem Relation Age of Onset  . Diabetes Father     No Known Allergies  Objective: Temp:  [98 F (36.7 C)-98.3 F (36.8 C)] 98 F (36.7 C) (08/10 0501) Pulse Rate:  [80-88] 80 (08/10 0501) Resp:  [16] 16 (08/10 0501) BP: (94-107)/(60-65) 97/65 mmHg (08/10 0501) SpO2:  [96 %-98 %] 96 % (08/10 0501)  Physical Exam: General: Alert, cooperative, no acute distress, non-toxic appearing. HEENT: PERRL, EOMI. VP shunt on right posterior scalp tracking down right side of neck. No overlying tenderness.  Neck: Full range of motion without pain, supple. Lungs: Decreased breath sounds on right base. Otherwise no wheezes or rales.  Heart: Regular rate and rhythm, no murmurs. Abdomen: Soft, non-tender, non-distended, bowel sounds present.  Extremities: No cyanosis, clubbing, or edema Neurologic: Alert & oriented X3, cranial nerves II-XII intact, strength grossly intact, sensation  intact to light touch   Lab Results Lab Results  Component Value Date   WBC 7.7 05/16/2014   HGB 12.5 05/16/2014   HCT 38.6 05/16/2014   MCV 83.4 05/16/2014   PLT 354 05/16/2014    Lab Results  Component Value Date   CREATININE 0.69 05/16/2014   BUN 11 05/16/2014   NA 141 05/16/2014   K 4.0 05/16/2014   CL 104 05/16/2014   CO2 26 05/16/2014    Lab Results  Component Value Date   ALT 19 05/15/2014   AST 17 05/15/2014   ALKPHOS 84 05/15/2014   BILITOT 0.3 05/15/2014      Microbiology: Recent Results (from the past 240 hour(s))  BODY FLUID CULTURE     Status: None   Collection Time    05/12/14  7:50 PM      Result Value Ref Range Status   Specimen Description PLEURAL FLUID RIGHT   Final   Special Requests NONE   Final   Gram Stain     Final   Value: RARE WBC PRESENT,BOTH PMN AND MONONUCLEAR     NO ORGANISMS SEEN     Performed at [facility]  Final   Value: NO GROWTH 3 DAYS     Performed at [facility]   Report Status 05/16/2014 FINAL   Final  AFB CULTURE WITH SMEAR     Status: None   Collection Time    05/12/14  7:50 PM      Result Value Ref Range Status   Specimen Description PLEURAL FLUID RIGHT   Final   Special Requests NONE   Final   Acid Fast Smear     Final   Value: NO ACID FAST BACILLI SEEN     Performed at [facility]   Culture     Final   Value: CULTURE WILL BE EXAMINED FOR 6 WEEKS BEFORE ISSUING A FINAL REPORT     Performed at [facility]   Report Status PENDING   Incomplete  FUNGUS CULTURE W SMEAR     Status: None   Collection Time    05/12/14  7:50 PM      Result Value Ref Range Status   Specimen Description PLEURAL FLUID RIGHT   Final   Special Requests NONE   Final   Fungal Smear     Final   Value: NO YEAST OR FUNGAL ELEMENTS SEEN     Performed at [facility]   Culture     Final   Value: CULTURE IN PROGRESS FOR FOUR WEEKS     Performed at [facility]   Report Status PENDING   Incomplete  CULTURE,  BLOOD (ROUTINE X 2)     Status: None   Collection Time    05/12/14  8:15 PM      Result Value Ref Range Status   Specimen Description BLOOD RIGHT ARM   Final   Special Requests BOTTLES DRAWN AEROBIC AND ANAEROBIC 6CC   Final   Culture  Setup Time     Final   Value: 05/13/2014 00:24     Performed at [facility]   Culture     Final   Value:        BLOOD CULTURE RECEIVED NO GROWTH TO DATE CULTURE WILL BE HELD FOR 5 DAYS BEFORE ISSUING A FINAL NEGATIVE REPORT     Performed at [facility]   Report Status PENDING   Incomplete  CULTURE, BLOOD (ROUTINE X 2)     Status: None   Collection Time    05/12/14  8:20 PM      Result Value Ref Range Status   Specimen Description BLOOD RIGHT HAND   Final   Special Requests BOTTLES DRAWN AEROBIC ONLY 10CC   Final   Culture  Setup Time     Final   Value: 05/13/2014 00:23     Performed at [facility]   Culture     Final   Value:        BLOOD CULTURE RECEIVED NO GROWTH TO DATE CULTURE WILL BE HELD FOR 5 DAYS BEFORE ISSUING A FINAL NEGATIVE REPORT     Performed at [facility]   Report Status PENDING   Incomplete  CSF CULTURE     Status: None   Collection Time    05/14/14  3:04 PM      Result Value Ref Range Status   Specimen Description CSF   Final   Special Requests NONE   Final   Gram Stain     Final   Value: WBC PRESENT, PREDOMINANTLY MONONUCLEAR     NO ORGANISMS SEEN     CYTOSPIN  Performed at [facility]     Performed at [facility]   Culture     Final   Value: NO GROWTH 3 DAYS     Performed at [facility]   Report Status PENDING   Incomplete  GRAM STAIN     Status: None   Collection Time    05/14/14  3:04 PM      Result Value Ref Range Status   Specimen Description CSF   Final   Special Requests NONE   Final   Gram Stain     Final   Value: CYTOSPIN PREP     WBC PRESENT, PREDOMINANTLY MONONUCLEAR     NO ORGANISMS SEEN   Report Status 05/14/2014 FINAL   Final  AFB CULTURE  WITH SMEAR     Status: None   Collection Time    05/15/14  4:10 PM      Result Value Ref Range Status   Specimen Description PLEURAL RIGHT FLUID   Final   Special Requests NONE   Final   Acid Fast Smear     Final   Value: NO ACID FAST BACILLI SEEN     Performed at [facility]   Culture     Final   Value: CULTURE WILL BE EXAMINED FOR 6 WEEKS BEFORE ISSUING A FINAL REPORT     Performed at [facility]   Report Status PENDING   Incomplete  FUNGUS CULTURE W SMEAR     Status: None   Collection Time    05/15/14  4:10 PM      Result Value Ref Range Status   Specimen Description PLEURAL RIGHT   Final   Special Requests NONE   Final   Fungal Smear     Final   Value: NO YEAST OR FUNGAL ELEMENTS SEEN     Performed at [facility]   Culture     Final   Value: CULTURE IN PROGRESS FOR FOUR WEEKS     Performed at [facility]   Report Status PENDING   Incomplete  BODY FLUID CULTURE     Status: None   Collection Time    05/15/14  4:10 PM      Result Value Ref Range Status   Specimen Description PLEURAL RIGHT FLUID   Final   Special Requests NONE   Final   Gram Stain     Final   Value: MODERATE WBC PRESENT,BOTH PMN AND MONONUCLEAR     NO ORGANISMS SEEN     Performed at [facility]   Culture     Final   Value: NO GROWTH 1 DAY     Performed at [facility]   Report Status PENDING   Incomplete    Studies/Results: Dg Chest 1 View  05/15/2014   CLINICAL DATA:  Post right thoracentesis  EXAM: CHEST - 1 VIEW  COMPARISON:  05/14/2014  FINDINGS: No pneumothorax status post right thoracentesis.  Moderate right pleural effusion. Right pleural drain. Associated right lower lobe opacity, likely compressive atelectasis.  Left lung is clear.  The heart is normal in size.  IMPRESSION: No pneumothorax status post right thoracentesis.  Moderate right pleural effusion with right pleural drain.   Electronically Signed   By: Charline Bills M.D.   On:  05/15/2014 16:36   US Thoracentesis Asp Pleural Space W/img Guide  05/15/2014   CLINICAL DATA:  Right pleural effusion. Right ventricular pleural shunt.  EXAM: ULTRASOUND GUIDED RIGHT THORACENTESIS  COMPARISON:  CT 05/12/2014  PROCEDURE: An ultrasound guided thoracentesis was thoroughly discussed with the patient and questions answered. The benefits, risks, alternatives and complications were also discussed. The patient understands and wishes to proceed with the procedure. Written consent was obtained.  Ultrasound was performed to localize and mark an adequate pocket of fluid in the right posterior chest. The area was then prepped and draped in the normal sterile fashion. 1% Lidocaine was used for local anesthesia. Under ultrasound guidance a 19 gauge Yueh catheter was introduced. Thoracentesis was performed. The catheter was removed and a dressing applied.  Complications:  None.  FINDINGS: A total of approximately 600 mL of yellow fluid was removed. A fluid sample wassent for laboratory analysis. The right pleural fluid is complex with septations.  IMPRESSION: Successful ultrasound guided right thoracentesis yielding 600 ml of pleural fluid.   Electronically Signed   By: Richarda Overlie M.D.   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 Repeat thoracentesis on 05/15/14 showed similar findings w/ further decreased glucose (<20). On exam, patient says she feels fine, claims her headache has completely resolved, cough has decreased significantly, according to the patient. Still w/ dry cough on exam. Hepatitis panel and HIV Ab negative. All cultures also shown to be negative at this time. Still concern for VP shunt malfunction vs indolent infection given worsened exudative/loculated effusion. Started on  Rocephin, Flagyl, and Vancomycin on 05/16/14.   Plan: 1. Continue Rocephin, Flagyl, Vancomycin 2. VP shunt revision/replacement   Lars Masson, MD PGY-2, Internal Medicine Pager: [phone] 05/17/2014, 10:41 AM  Attending addendum: All pleural fluid and CSF stains and cultures remain negative. I am still suspicious of shunt infection. I will continue current empiric antibiotic therapy pending final cultures and shunt revision.  Cliffton Asters, MD [facility] [phone] pager   [phone] cell 05/17/2014, 3:47 PM

## 2014-05-17 NOTE — Care Management Note (Addendum)
    Page 1 of 2   05/22/2014     11:39:44 AM CARE MANAGEMENT NOTE 05/22/2014  Patient:  Lisa LimerickDELLORUSSO,Aerith   Account Number:  [id number]  Date Initiated:  05/14/2014  Documentation initiated by:  [facility]  Subjective/Objective Assessment:   Pleural effusion, right  ? vp shunt infection     Action/Plan:   plan for vp shunt revision 8/12   Anticipated DC Date:  05/21/2014   Anticipated DC Plan:  HOME W HOME HEALTH SERVICES      DC Planning Services  CM consult      Choice offered to / List presented to:  C-1 Patient        HH arranged  HH-1 RN      [facility]   Status of service:  Completed, signed off Medicare Important Message given?  NO (If response is "NO", the following Medicare IM given date fields will be blank) Date Medicare IM given:   Medicare IM given by:   Date Additional Medicare IM given:   Additional Medicare IM given by:    Discharge Disposition:  HOME W HOME HEALTH SERVICES  Per UR Regulation:  Reviewed for med. necessity/level of care/duration of stay  If discussed at Long Length of Stay Meetings, dates discussed:    Comments:  05/22/14 10:45 [facility]  Judeth CornfieldStephanie had already arranged and confirmed ok to discharge.  Cm notified RN.  No other CM needs were communicated.  Freddy JakschSarah Thomos Domine, Georgiann MohsBSN,CM [phone].  05/21/2014- Patient is to go home on IV antibiotics; HHC choice offered to patient, [facility]; [facility] called for arrangementsAlexis Goodell; B Chandler RN,BSN,MHA [phone]  05/18/14 1202 Letha Capeeborah Taylor RN, BSN [phone] patient for vp shunt revision on 8/12.  LATE ENTRY: 05/16/14 CM received call from ED CM requesting I [facility] and explain pt can go to the/Clinic this Tuesday or Wednesday morning at 09:00 (Walk IN) and Clinic staff will be expecting her.  Pt requested I speak with her husband on phone.  Pt's husband verbalized  understanding earlier appt with [facility] was cancelled and pt will be expected on either Tuesday or Wednesday morning at 09:00. No other CM needs were communicated.  Freddy JakschSarah Kindal Ponti, BSN, CM [phone].

## 2014-05-17 NOTE — Progress Notes (Signed)
Patient ID: [name], female   DOB: 27-Dec-1975, 38 y.o.   MRN: [id number] Subjective:  The patient is alert and pleasant. She looks well. She has no complaints. He is accompanied by family friends who speak [place] and [place] well.  Objective: Vital signs in last 24 hours: Temp:  [98 F (36.7 C)-98.3 F (36.8 C)] 98 F (36.7 C) (08/10 0501) Pulse Rate:  [80-88] 80 (08/10 0501) Resp:  [16] 16 (08/10 0501) BP: (94-107)/(60-65) 97/65 mmHg (08/10 0501) SpO2:  [96 %-98 %] 96 % (08/10 0501)  Intake/Output from previous day:   Intake/Output this shift: Total I/O In: 120 [P.O.:120] Out: -   Physical exam the patient is alert and oriented. Her speech is normal. Her strength is normal.  Lab Results:  Recent Labs  05/16/14 0526  WBC 7.7  HGB 12.5  HCT 38.6  PLT 354   BMET  Recent Labs  05/16/14 0526  NA 141  K 4.0  CL 104  CO2 26  GLUCOSE 99  BUN 11  CREATININE 0.69  CALCIUM 8.8    Studies/Results: Dg Chest 1 View  05/15/2014   CLINICAL DATA:  Post right thoracentesis  EXAM: CHEST - 1 VIEW  COMPARISON:  05/14/2014  FINDINGS: No pneumothorax status post right thoracentesis.  Moderate right pleural effusion. Right pleural drain. Associated right lower lobe opacity, likely compressive atelectasis.  Left lung is clear.  The heart is normal in size.  IMPRESSION: No pneumothorax status post right thoracentesis.  Moderate right pleural effusion with right pleural drain.   Electronically Signed   By: Charline Bills M.D.   On: 05/15/2014 16:36   US Thoracentesis Asp Pleural Space W/img Guide  05/15/2014   CLINICAL DATA:  Right pleural effusion. Right ventricular pleural shunt.  EXAM: ULTRASOUND GUIDED RIGHT THORACENTESIS  COMPARISON:  CT 05/12/2014  PROCEDURE: An ultrasound guided thoracentesis was thoroughly discussed with the patient and questions answered. The benefits, risks, alternatives and complications were also discussed. The patient understands and wishes to  proceed with the procedure. Written consent was obtained.  Ultrasound was performed to localize and mark an adequate pocket of fluid in the right posterior chest. The area was then prepped and draped in the normal sterile fashion. 1% Lidocaine was used for local anesthesia. Under ultrasound guidance a 19 gauge Yueh catheter was introduced. Thoracentesis was performed. The catheter was removed and a dressing applied.  Complications:  None.  FINDINGS: A total of approximately 600 mL of yellow fluid was removed. A fluid sample wassent for laboratory analysis. The right pleural fluid is complex with septations.  IMPRESSION: Successful ultrasound guided right thoracentesis yielding 600 ml of pleural fluid.   Electronically Signed   By: Richarda Overlie M.D.   On: 05/15/2014 16:30    Assessment/Plan: Ventriculopleural shunt, pleural effusion: I agree with empiric antibiotics as directed by Dr. Algis Liming. I am available for any shunt revision on Wednesday or Thursday. I would preferably like to replace her shunt with a ventriculoperitoneal shunt(if this is impossible and a ventriculopleural shunt with the distal catheter in her left thorax ).  I would like to see if the general surgeons are able to help him with a laparoscopic placement of the peritoneal catheter. I will need to check on the availability.  I've explained the surgery to the patient and her family friends. We discussed the risk including risk of infection, shunt malplacement or malfunction, intracerebral hemorrhage, shunt infection, risks of anesthesia, medical risk, etc. I have answered all their questions.  LOS: 5 days     [name] D 05/17/2014, 2:09 PM

## 2014-05-17 NOTE — Progress Notes (Signed)
PROGRESS NOTE  [name] RUE:[id number] DOB: Jan 19, 1976 DOA: 05/12/2014 PCP: Pcp Not In System  Assessment/Plan: Right Pleural Effusion with ventriculopleural shunt  -pt has ventriculopleural shunt with revision on 12/04/13  -8/5/15Thoracocentesis (1.5L) shows WBC 2929 with 50Mono/22E/28N--exudative  -05/15/14 thoracocentesis (600cc)--WBC 1671 74% monos--exudative, glucose <20  - Remain Concerned about ventriculopleural indolent shunt infection vs malfunction although shunt tap showed normal protein, normal glucose and one WBC.  -Appreciate Daiva Eves and Dr. Lovell Sheehan followup  -05/16/2014 vancomycin, ceftriaxone, Flagyl started by IV -Gram stain and culture negative of pleural fluid--neg  -Pleural fluid beta-2 transferrin--positive -Blood cultures negative  -05/15/14--Repeat Thoracocentesis for cell count, fungal, bacterial and AFB cultures-->also send for MTB PCR and adenosine deaminase and cytology  -case discussed with Dr. Sherene Sires  -plans noted for possible shunt revision 8/12 or 8/13-->-please send shunt/fluid for culture when shunt is revised  -IGRA--negative Headache  -resolved  -05/14/14 shunt tap culture neg  -Likely related to ventriculopleural shunt  -Headache improved after thoracocentesis  -CT brain shows no acute findings  -Appreciate neurosurgery followup  Cough  -likely due to pleural effusion  -start hycodan  Family Communication: discussed with family at bedside  Disposition Plan: Home when cleared by neurosurgery and ID          Procedures/Studies: Dg Chest 1 View  05/15/2014   CLINICAL DATA:  Post right thoracentesis  EXAM: CHEST - 1 VIEW  COMPARISON:  05/14/2014  FINDINGS: No pneumothorax status post right thoracentesis.  Moderate right pleural effusion. Right pleural drain. Associated right lower lobe opacity, likely compressive atelectasis.  Left lung is clear.  The heart is normal in size.  IMPRESSION: No pneumothorax status post right  thoracentesis.  Moderate right pleural effusion with right pleural drain.   Electronically Signed   By: Charline Bills M.D.   On: 05/15/2014 16:36   Dg Chest 2 View  05/14/2014   CLINICAL DATA:  38 year old female with cough and pleural effusion. Ventriculoperitoneal shunt.  EXAM: CHEST  2 VIEW  COMPARISON:  05/12/2014 and prior radiographs and CTs  FINDINGS: A moderate right pleural effusion is unchanged with right lower lung atelectasis.  The left lung is clear.  The cardiomediastinal silhouette is stable.  A ventriculoperitoneal shunt is again identified overlying the right chest with tip overlying the lower right hemithorax. There is no evidence of pneumothorax.  IMPRESSION: Unchanged moderate right pleural effusion with right basilar atelectasis. Unchanged position of ventriculoperitoneal shunt.   Electronically Signed   By: Laveda Abbe M.D.   On: 05/14/2014 20:22   Dg Chest 2 View  05/12/2014   CLINICAL DATA:  Cough and congestion.  EXAM: CHEST  2 VIEW  COMPARISON:  05/11/2014  FINDINGS: Two views of this chest demonstrate a right ventricular-pleural shunt catheter. There appears to be slightly increased right pleural fluid with compressive atelectasis. The right pleural effusion is moderate-to-large in size. The left lung remains clear. Trachea is midline. Heart size is stable.  IMPRESSION: There is a moderate-to-large sized right pleural effusion which has slightly enlarged since 05/11/2014.  Right ventricular-pleural shunt.   Electronically Signed   By: Richarda Overlie M.D.   On: 05/12/2014 17:14   Dg Chest 2 View  05/11/2014   CLINICAL DATA:  Cough and decreased breath sounds on the right  EXAM: CHEST  2 VIEW  COMPARISON:  Chest radiograph December 30, 2013 and chest CT December 30, 2013  FINDINGS: There is a persistent pleural effusion on the right  which may be slightly increased. There is atelectasis and possible consolidation in the right middle and lower lobes. The left lung is clear. Heart size and  pulmonary vascularity are normal. No adenopathy. There is a shunt catheter on the right which appears coiled in the right hemi thorax.  IMPRESSION: Shunt catheter appears coiled in the right hemithorax. The sizable effusion on the right may at least in part be due to fluid collecting in the right chest from the shunt catheter. There is probable compressive atelectasis in the right middle and lower lobes. Left lung is clear.  These results will be called to the ordering clinician or representative by the Radiologist Assistant, and communication documented in the PACS or zVision Dashboard.   Electronically Signed   By: Bretta Bang M.D.   On: 05/11/2014 16:38   Dg Abd 1 View  05/12/2014   CLINICAL DATA:  Cough and congestion, fever, VP shunt  EXAM: ABDOMEN - 1 VIEW  COMPARISON:  None.  FINDINGS: The bowel gas pattern is normal. No radio-opaque calculi or other significant radiographic abnormality are seen. Mild stool volume. No VP shunt tubing is identified. IUD in place over the pelvis.  IMPRESSION: Negative.   Electronically Signed   By: Christiana Pellant M.D.   On: 05/12/2014 17:12   Ct Head Wo Contrast  05/12/2014   CLINICAL DATA:  Headache.  History of VP shunt.  EXAM: CT HEAD WITHOUT CONTRAST  TECHNIQUE: Contiguous axial images were obtained from the base of the skull through the vertex without intravenous contrast.  COMPARISON:  12/04/2013  FINDINGS: Stable position of the ventriculostomy catheter. Maximal Min ventricular diameter is stable at 4.2 cm. Stable prominent temporal horns. No extra-axial fluid collections are identified. The gray-white differentiation is maintained. No findings for hemispheric infarction an or intracranial hemorrhage. The brainstem and cerebellum are grossly normal and stable.  No significant bony findings.  IMPRESSION: Stable ventriculostomy shunt catheter.  Stable appearance and size of the ventricles. No mass effect or shift.  No acute intracranial findings.    Electronically Signed   By: Loralie Champagne M.D.   On: 05/12/2014 17:01   Ct Chest W Contrast  05/12/2014   CLINICAL DATA:  Progressive right pleural effusion. Ventricular shunt  EXAM: CT CHEST WITH CONTRAST  TECHNIQUE: Multidetector CT imaging of the chest was performed during intravenous contrast administration.  CONTRAST:  75mL OMNIPAQUE IOHEXOL 300 MG/ML  SOLN  COMPARISON:  CT chest 12/30/2013  FINDINGS: Ventricular pleural shunt is present with the shunt tubing terminating in the pleural space on the right posteriorly, unchanged from the prior study. Moderate right pleural effusion has progressed. There is increased loculated fluid anteriorly in the base on the right. Progression of right lower lobe atelectasis.  Negative for pleural effusion on the left. No mass or adenopathy is detected.  The aortic arch is normal.  Pulmonary arteries opacified normally.  IMPRESSION: Mild increase in right pleural effusion with a loculated component anteriorly. Increase in right lower lobe compressive atelectasis.  Left lung remains clear.   Electronically Signed   By: Marlan Palau M.D.   On: 05/12/2014 21:02   Dg Chest Port 1 View  05/12/2014   CLINICAL DATA:  Post thoracentesis  EXAM: PORTABLE CHEST - 1 VIEW  COMPARISON:  05/12/2014  FINDINGS: There is small to moderate right pleural effusion with right basilar atelectasis or infiltrate. No pulmonary edema. No pneumothorax. Right ventricular pleural catheter is stable in position.  IMPRESSION: Small to moderate right pleural effusion with  right basilar atelectasis or infiltrate. No pulmonary edema. No pneumothorax.   Electronically Signed   By: Natasha Mead M.D.   On: 05/12/2014 20:01   US Thoracentesis Asp Pleural Space W/img Guide  05/15/2014   CLINICAL DATA:  Right pleural effusion. Right ventricular pleural shunt.  EXAM: ULTRASOUND GUIDED RIGHT THORACENTESIS  COMPARISON:  CT 05/12/2014  PROCEDURE: An ultrasound guided thoracentesis was thoroughly discussed with  the patient and questions answered. The benefits, risks, alternatives and complications were also discussed. The patient understands and wishes to proceed with the procedure. Written consent was obtained.  Ultrasound was performed to localize and mark an adequate pocket of fluid in the right posterior chest. The area was then prepped and draped in the normal sterile fashion. 1% Lidocaine was used for local anesthesia. Under ultrasound guidance a 19 gauge Yueh catheter was introduced. Thoracentesis was performed. The catheter was removed and a dressing applied.  Complications:  None.  FINDINGS: A total of approximately 600 mL of yellow fluid was removed. A fluid sample wassent for laboratory analysis. The right pleural fluid is complex with septations.  IMPRESSION: Successful ultrasound guided right thoracentesis yielding 600 ml of pleural fluid.   Electronically Signed   By: Richarda Overlie M.D.   On: 05/15/2014 16:30         Subjective: Patient denies fevers, chills, headache, chest pain, dyspnea, nausea, vomiting, diarrhea, abdominal pain, dysuria, hematuria   Objective: Filed Vitals:   05/16/14 1421 05/16/14 2026 05/17/14 0501 05/17/14 1507  BP: 107/64 94/60 97/65  96/61  Pulse: 88 87 80 92  Temp: 98.3 F (36.8 C) 98.1 F (36.7 C) 98 F (36.7 C) 98.2 F (36.8 C)  TempSrc: Oral Oral Oral Oral  Resp: 16 16 16 16   Height:      Weight:      SpO2: 98% 96% 96% 95%    Intake/Output Summary (Last 24 hours) at 05/17/14 1857 Last data filed at 05/17/14 1800  Gross per 24 hour  Intake    720 ml  Output      0 ml  Net    720 ml   Weight change:  Exam:   General:  Pt is alert, follows commands appropriately, not in acute distress  HEENT: No icterus, No thrush,  [place]/AT  Cardiovascular: RRR, S1/S2, no rubs, no gallops  Respiratory: CTA bilaterally, no wheezing, no crackles, no rhonchi  Abdomen: Soft/+BS, non tender, non distended, no guarding  Extremities: No edema, No lymphangitis, No  petechiae, No rashes, no synovitis  Data Reviewed: Basic Metabolic Panel:  Recent Labs Lab 05/12/14 1617 05/13/14 0753 05/16/14 0526  NA 142 141 141  K 3.5* 3.6* 4.0  CL 105 105 104  CO2 26 25 26   GLUCOSE 92 80 99  BUN 12 6 11   CREATININE 0.62 0.60 0.69  CALCIUM 8.5 8.5 8.8   Liver Function Tests:  Recent Labs Lab 05/13/14 0753 05/15/14 1835  AST 13 17  ALT 14 19  ALKPHOS 79 84  BILITOT 0.9 0.3  PROT 6.7 7.3  ALBUMIN 2.7* 2.9*   No results found for this basename: LIPASE, AMYLASE,  in the last 168 hours No results found for this basename: AMMONIA,  in the last 168 hours CBC:  Recent Labs Lab 05/11/14 1627 05/12/14 1617 05/13/14 0727 05/14/14 0529 05/16/14 0526  WBC 8.2 6.9 5.9 10.6* 7.7  NEUTROABS  --  4.6  --   --   --   HGB 12.8 11.9* 12.3 13.1 12.5  HCT 38.9 37.0  38.4 40.3 38.6  MCV 83.9 85.3 85.7 85.4 83.4  PLT  --  323 319 344 354   Cardiac Enzymes: No results found for this basename: CKTOTAL, CKMB, CKMBINDEX, TROPONINI,  in the last 168 hours BNP: No components found with this basename: POCBNP,  CBG: No results found for this basename: GLUCAP,  in the last 168 hours  Recent Results (from the past 240 hour(s))  BODY FLUID CULTURE     Status: None   Collection Time    05/12/14  7:50 PM      Result Value Ref Range Status   Specimen Description PLEURAL FLUID RIGHT   Final   Special Requests NONE   Final   Gram Stain     Final   Value: RARE WBC PRESENT,BOTH PMN AND MONONUCLEAR     NO ORGANISMS SEEN     Performed at [facility]   Culture     Final   Value: NO GROWTH 3 DAYS     Performed at [facility]   Report Status 05/16/2014 FINAL   Final  AFB CULTURE WITH SMEAR     Status: None   Collection Time    05/12/14  7:50 PM      Result Value Ref Range Status   Specimen Description PLEURAL FLUID RIGHT   Final   Special Requests NONE   Final   Acid Fast Smear     Final   Value: NO ACID FAST BACILLI SEEN     Performed at  [facility]   Culture     Final   Value: CULTURE WILL BE EXAMINED FOR 6 WEEKS BEFORE ISSUING A FINAL REPORT     Performed at [facility]   Report Status PENDING   Incomplete  FUNGUS CULTURE W SMEAR     Status: None   Collection Time    05/12/14  7:50 PM      Result Value Ref Range Status   Specimen Description PLEURAL FLUID RIGHT   Final   Special Requests NONE   Final   Fungal Smear     Final   Value: NO YEAST OR FUNGAL ELEMENTS SEEN     Performed at [facility]   Culture     Final   Value: CULTURE IN PROGRESS FOR FOUR WEEKS     Performed at [facility]   Report Status PENDING   Incomplete  CULTURE, BLOOD (ROUTINE X 2)     Status: None   Collection Time    05/12/14  8:15 PM      Result Value Ref Range Status   Specimen Description BLOOD RIGHT ARM   Final   Special Requests BOTTLES DRAWN AEROBIC AND ANAEROBIC 6CC   Final   Culture  Setup Time     Final   Value: 05/13/2014 00:24     Performed at [facility]   Culture     Final   Value:        BLOOD CULTURE RECEIVED NO GROWTH TO DATE CULTURE WILL BE HELD FOR 5 DAYS BEFORE ISSUING A FINAL NEGATIVE REPORT     Performed at [facility]   Report Status PENDING   Incomplete  CULTURE, BLOOD (ROUTINE X 2)     Status: None   Collection Time    05/12/14  8:20 PM      Result Value Ref Range Status   Specimen Description BLOOD RIGHT HAND   Final   Special Requests BOTTLES DRAWN  AEROBIC ONLY 10CC   Final   Culture  Setup Time     Final   Value: 05/13/2014 00:23     [facility]   Culture     Final   Value:        BLOOD CULTURE RECEIVED NO GROWTH TO DATE CULTURE WILL BE HELD FOR 5 DAYS BEFORE ISSUING A FINAL NEGATIVE REPORT     [facility]   Report Status PENDING   Incomplete  CSF CULTURE     Status: None   Collection Time    05/14/14  3:04 PM      Result Value Ref Range Status   Specimen Description CSF   Final   Special Requests NONE    Final   Gram Stain     Final   Value: WBC PRESENT, PREDOMINANTLY MONONUCLEAR     NO ORGANISMS SEEN     CYTOSPIN [facility]     [facility]   Culture     Final   Value: NO GROWTH 3 DAYS     [facility]   Report Status PENDING   Incomplete  GRAM STAIN     Status: None   Collection Time    05/14/14  3:04 PM      Result Value Ref Range Status   Specimen Description CSF   Final   Special Requests NONE   Final   Gram Stain     Final   Value: CYTOSPIN PREP     WBC PRESENT, PREDOMINANTLY MONONUCLEAR     NO ORGANISMS SEEN   Report Status 05/14/2014 FINAL   Final  AFB CULTURE WITH SMEAR     Status: None   Collection Time    05/15/14  4:10 PM      Result Value Ref Range Status   Specimen Description PLEURAL RIGHT FLUID   Final   Special Requests NONE   Final   Acid Fast Smear     Final   Value: NO ACID FAST BACILLI SEEN     [facility]   Culture     Final   Value: CULTURE WILL BE EXAMINED FOR 6 WEEKS BEFORE ISSUING A FINAL REPORT     [facility]   Report Status PENDING   Incomplete  FUNGUS CULTURE W SMEAR     Status: None   Collection Time    05/15/14  4:10 PM      Result Value Ref Range Status   Specimen Description PLEURAL RIGHT   Final   Special Requests NONE   Final   Fungal Smear     Final   Value: NO YEAST OR FUNGAL ELEMENTS SEEN     [facility]   Culture     Final   Value: CULTURE IN PROGRESS FOR FOUR WEEKS     [facility]   Report Status PENDING   Incomplete  BODY FLUID CULTURE     Status: None   Collection Time    05/15/14  4:10 PM      Result Value Ref Range Status   Specimen Description PLEURAL RIGHT FLUID   Final   Special Requests NONE   Final   Gram Stain     Final   Value: MODERATE WBC PRESENT,BOTH PMN AND MONONUCLEAR     NO ORGANISMS SEEN     [facility]   Culture     Final    Value: NO GROWTH 1 DAY     Performed at [facility]   Report Status PENDING   Incomplete     Scheduled Meds: . cefTRIAXone (ROCEPHIN)  IV  2 g Intravenous Q12H  . feeding supplement (ENSURE COMPLETE)  237 mL Oral BID BM  . heparin  5,000 Units Subcutaneous 3 times per day  . metroNIDAZOLE  500 mg Oral 3 times per day  . senna  1 tablet Oral BID  . sodium chloride  3 mL Intravenous Q12H  . vancomycin  750 mg Intravenous Q8H   Continuous Infusions:    Wilba Mutz, DO  [facility] Pager [phone]  If 7PM-7AM, please contact night-coverage [email or web address] Password [email or web address] 05/17/2014, 6:57 PM   LOS: 5 days

## 2014-05-18 ENCOUNTER — Other Ambulatory Visit: Payer: Self-pay | Admitting: Neurosurgery

## 2014-05-18 DIAGNOSIS — Z5189 Encounter for other specified aftercare: Secondary | ICD-10-CM

## 2014-05-18 LAB — CSF CULTURE W GRAM STAIN: Culture: NO GROWTH

## 2014-05-18 LAB — CSF CULTURE

## 2014-05-18 LAB — VANCOMYCIN, TROUGH: Vancomycin Tr: 8.8 ug/mL — ABNORMAL LOW (ref 10.0–20.0)

## 2014-05-18 MED ORDER — VANCOMYCIN HCL 10 G IV SOLR
1250.0000 mg | Freq: Three times a day (TID) | INTRAVENOUS | Status: DC
  Administered 2014-05-18 – 2014-05-20 (×6): 1250 mg via INTRAVENOUS
  Filled 2014-05-18 (×8): qty 1250

## 2014-05-18 NOTE — Progress Notes (Signed)
Patient ID: [name], female   DOB: 09/13/1976, 38 y.o.   MRN: [id number] I have put a call into the general surgeons to check on their availability to help with a shunt revision tomorrow or Thursday.

## 2014-05-18 NOTE — Progress Notes (Signed)
[facility]    Date of Admission:  05/12/2014    Total days of antibiotics 3        Day 3 Rocephin        Day 3 Flagyl        Day 3 Vancomycin         Active Problems:   Pleural effusion, right   Fever   Headache   . cefTRIAXone (ROCEPHIN)  IV  2 g Intravenous Q12H  . feeding supplement (ENSURE COMPLETE)  237 mL Oral BID BM  . heparin  5,000 Units Subcutaneous 3 times per day  . metroNIDAZOLE  500 mg Oral 3 times per day  . senna  1 tablet Oral BID  . sodium chloride  3 mL Intravenous Q12H  . vancomycin  750 mg Intravenous Q8H    Subjective: Lying comfortably in bed, no acute distress. Still w/ dry cough w/ movement and sitting up. No headaches, fever, chills, or nausea.    Past Medical History  Diagnosis Date  . VP (ventriculoperitoneal) shunt status   . Chronic headaches   . Chronic cough   . 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 Ventriculopleural shunt status     History  Substance Use Topics  . Smoking status: Never Smoker   . Smokeless tobacco: Never Used  . Alcohol Use: No    Family History  Problem Relation Age of Onset  . Diabetes Father     No Known Allergies  Objective: Temp:  [98.2 F (36.8 C)-98.7 F (37.1 C)] 98.3 F (36.8 C) (08/11 0430) Pulse Rate:  [78-92] 78 (08/11 0430) Resp:  [16-20] 18 (08/11 0430) BP: (96-107)/(61-75) 98/67 mmHg (08/11 0430) SpO2:  [95 %-96 %] 96 % (08/11 0430)  Physical Exam: General: Alert, cooperative, no acute distress. HEENT: PERRL, EOMI. VP shunt on right posterior scalp tracking down right side of neck. No overlying tenderness.  Neck: Full range of motion without pain, supple. Lungs: Decreased breath sounds on right base. Otherwise no wheezes or rales.  Heart: Regular rate and rhythm, no murmurs. Abdomen: Soft, non-tender, non-distended, bowel sounds present.  Extremities: No cyanosis, clubbing, or edema Neurologic: Alert & oriented X3, cranial  nerves II-XII intact, strength grossly intact, sensation intact to light touch   Lab Results Lab Results  Component Value Date   WBC 7.7 05/16/2014   HGB 12.5 05/16/2014   HCT 38.6 05/16/2014   MCV 83.4 05/16/2014   PLT 354 05/16/2014    Lab Results  Component Value Date   CREATININE 0.69 05/16/2014   BUN 11 05/16/2014   NA 141 05/16/2014   K 4.0 05/16/2014   CL 104 05/16/2014   CO2 26 05/16/2014    Lab Results  Component Value Date   ALT 19 05/15/2014   AST 17 05/15/2014   ALKPHOS 84 05/15/2014   BILITOT 0.3 05/15/2014      Microbiology: Recent Results (from the past 240 hour(s))  BODY FLUID CULTURE     Status: None   Collection Time    05/12/14  7:50 PM      Result Value Ref Range Status   Specimen Description PLEURAL FLUID RIGHT   Final   Special Requests NONE   Final   Gram Stain     Final   Value: RARE WBC PRESENT,BOTH PMN AND MONONUCLEAR     NO ORGANISMS SEEN     Performed at  [facility]   Culture     Final   Value: NO GROWTH 3 DAYS     Performed at [facility]   Report Status 05/16/2014 FINAL   Final  AFB CULTURE WITH SMEAR     Status: None   Collection Time    05/12/14  7:50 PM      Result Value Ref Range Status   Specimen Description PLEURAL FLUID RIGHT   Final   Special Requests NONE   Final   Acid Fast Smear     Final   Value: NO ACID FAST BACILLI SEEN     Performed at [facility]   Culture     Final   Value: CULTURE WILL BE EXAMINED FOR 6 WEEKS BEFORE ISSUING A FINAL REPORT     Performed at [facility]   Report Status PENDING   Incomplete  FUNGUS CULTURE W SMEAR     Status: None   Collection Time    05/12/14  7:50 PM      Result Value Ref Range Status   Specimen Description PLEURAL FLUID RIGHT   Final   Special Requests NONE   Final   Fungal Smear     Final   Value: NO YEAST OR FUNGAL ELEMENTS SEEN     Performed at [facility]   Culture     Final   Value: CULTURE IN PROGRESS FOR FOUR WEEKS     Performed at [facility]   Report Status PENDING   Incomplete  CULTURE, BLOOD (ROUTINE X 2)     Status: None   Collection Time    05/12/14  8:15 PM      Result Value Ref Range Status   Specimen Description BLOOD RIGHT ARM   Final   Special Requests BOTTLES DRAWN AEROBIC AND ANAEROBIC 6CC   Final   Culture  Setup Time     Final   Value: 05/13/2014 00:24     Performed at [facility]   Culture     Final   Value:        BLOOD CULTURE RECEIVED NO GROWTH TO DATE CULTURE WILL BE HELD FOR 5 DAYS BEFORE ISSUING A FINAL NEGATIVE REPORT     Performed at [facility]   Report Status PENDING   Incomplete  CULTURE, BLOOD (ROUTINE X 2)     Status: None   Collection Time    05/12/14  8:20 PM      Result Value Ref Range Status   Specimen Description BLOOD RIGHT HAND   Final   Special Requests BOTTLES DRAWN AEROBIC ONLY 10CC   Final   Culture  Setup Time     Final   Value: 05/13/2014 00:23     Performed at [facility]   Culture     Final   Value:        BLOOD CULTURE RECEIVED NO GROWTH TO DATE CULTURE WILL BE HELD FOR 5 DAYS BEFORE ISSUING A FINAL NEGATIVE REPORT     Performed at [facility]   Report Status PENDING   Incomplete  CSF CULTURE     Status: None   Collection Time    05/14/14  3:04 PM      Result Value Ref Range Status   Specimen Description CSF   Final   Special Requests NONE   Final   Gram Stain     Final   Value: WBC PRESENT, PREDOMINANTLY MONONUCLEAR  NO ORGANISMS SEEN     CYTOSPIN Performed at [facility]     Performed at [facility]   Culture     Final   Value: NO GROWTH 3 DAYS     Performed at [facility]   Report Status PENDING   Incomplete  GRAM STAIN     Status: None   Collection Time    05/14/14  3:04 PM      Result Value Ref Range Status   Specimen Description CSF   Final   Special Requests NONE   Final   Gram Stain     Final   Value: CYTOSPIN PREP     WBC PRESENT, PREDOMINANTLY MONONUCLEAR     NO ORGANISMS SEEN     Report Status 05/14/2014 FINAL   Final  AFB CULTURE WITH SMEAR     Status: None   Collection Time    05/15/14  4:10 PM      Result Value Ref Range Status   Specimen Description PLEURAL RIGHT FLUID   Final   Special Requests NONE   Final   Acid Fast Smear     Final   Value: NO ACID FAST BACILLI SEEN     Performed at [facility]   Culture     Final   Value: CULTURE WILL BE EXAMINED FOR 6 WEEKS BEFORE ISSUING A FINAL REPORT     Performed at [facility]   Report Status PENDING   Incomplete  FUNGUS CULTURE W SMEAR     Status: None   Collection Time    05/15/14  4:10 PM      Result Value Ref Range Status   Specimen Description PLEURAL RIGHT   Final   Special Requests NONE   Final   Fungal Smear     Final   Value: NO YEAST OR FUNGAL ELEMENTS SEEN     Performed at [facility]   Culture     Final   Value: CULTURE IN PROGRESS FOR FOUR WEEKS     Performed at [facility]   Report Status PENDING   Incomplete  BODY FLUID CULTURE     Status: None   Collection Time    05/15/14  4:10 PM      Result Value Ref Range Status   Specimen Description PLEURAL RIGHT FLUID   Final   Special Requests NONE   Final   Gram Stain     Final   Value: MODERATE WBC PRESENT,BOTH PMN AND MONONUCLEAR     NO ORGANISMS SEEN     Performed at [facility]     Final   Value: NO GROWTH 1 DAY     Performed at [facility]   Report Status PENDING   Incomplete    Assessment: 38 y/o F w/ h/o hydrocephalus w/ ventriculopleural shunt (revised 11/2013), admitted for worsening SOB and headaches, found to have increased exudative (lymphocyte predominant) pleural effusion on thoracentesis on 05/12/14. Repeat thoracentesis on 05/15/14 showed similar findings w/ further decreased glucose (<20). Started on Vancomycin, Flagyl, and Rocephin on 05/16/14 for concern of indolent shunt infection w/ loculated parapneumonic effusion. Patient is for revision of shunt likely from  ventriculopleural to ventriculoperitoneal shunt on 8/12 or 8/13.   Plan: 1. Continue Rocephin, Flagyl, Vancomycin 2. VP shunt revision as above  Lars Masson, MD PGY-2, Internal Medicine Pager: [phone] 05/18/2014, 8:34 AM  Attending addendum: Her dry cough is worsening again, most likely  due to reaccumulation of her right pleural effusion. So far all pleural fluid and spinal fluid cultures remain negative. I will continue current antibiotic therapy pending final cultures and shunt revision.  Cliffton Asters, MD [facility] [phone] pager   [phone] cell 05/18/2014, 2:46 PM

## 2014-05-18 NOTE — Progress Notes (Signed)
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 The patient underwent a revision performed by Dr. Tressie Stalker on 12/03/13. Since that period of time, the patient has complained of headache with intermittent nausea with nonproductive cough. She presented to the emergency department on 05/12/2014 with progressive headache, worsening cough and shortness of breath. Chest x-ray in the emergency department revealed her worsening right-sided pleural effusion. CT of the brain did not show any hydrocephalus. Pulmonology was consulted. The patient underwent initial thoracocentesis on 05/12/2014. Although the cultures from the thoracocentesis were negative, the fluid studies were consistent with an exudate. After the thoracocentesis, the patient's breathing and headache improved.  Dr. Lovell Sheehan (neurosurgery) saw the patient and a shunt tap was performed on 05/14/2014. The CSF cultures were negative and cell count was only 1 WBC with normal protein and glucose. Infectious disease was consulted. Initially, they did not recommend starting the patient on antibiotics at this point as the patient is afebrile and clinically stable. Repeat thoracocentesis was performed on 05/15/2014. Once again, the fluid was consistent with an exudate although the cultures remained negative. Multiple studies were sent from the pleural fluid including cytology, adenosine deaminase, MTB PCR, bacterial, fungal, and AFB cultures. All consultants felt that there was some degree of VP shunt dysfunction. As a result, Dr. Lovell Sheehan plans to revise the shunt on 05/19/2014 or 05/20/2014. Because the repeat thoracocentesis showed a glucose less than 20, there was concern for a consumptive process in the patient's pleural cavity. As a result, infectious disease started the  patient on intravenous antibiotics on 05/16/14.  Assessment/Plan: Right Pleural Effusion with ventriculopleural shunt  -pt has ventriculopleural shunt with revision on 12/04/13  -8/5/15Thoracocentesis (1.5L) shows WBC 2929 with 50Mono/22E/28N--exudative  -05/15/14 thoracocentesis (600cc)--WBC 1671 74% monos--exudative, glucose <20  - Remain Concerned about ventriculopleural indolent shunt infection vs malfunction although shunt tap showed normal protein, normal glucose and one WBC.  -Appreciate Dr. Orvan Falconer and Dr. Lovell Sheehan followup  -05/16/2014 vancomycin, ceftriaxone, Flagyl started by IV  -Gram stain and culture negative of pleural fluid--neg  -Pleural fluid beta-2 transferrin--positive  -Blood cultures negative  -05/15/14--Repeat Thoracocentesis for cell count, fungal, bacterial and AFB cultures-->also send for MTB PCR and adenosine deaminase and cytology  -plans noted for possible shunt revision 8/12 or 8/13-->-please send shunt/fluid for culture when shunt is revised  -IGRA (Quantiferon)--negative  Headache  -resolved  -05/14/14 shunt tap culture neg  -Likely related to ventriculopleural shunt  -Headache improved after thoracocentesis  -CT brain shows no acute findings  -Appreciate neurosurgery followup  Cough  -likely due to pleural effusion  -start hycodan  Family Communication: discussed with family at bedside  Disposition Plan: Home when cleared by neurosurgery and ID        Procedures/Studies: Dg Chest 1 View  05/15/2014   CLINICAL DATA:  Post right thoracentesis  EXAM: CHEST - 1 VIEW  COMPARISON:  05/14/2014  FINDINGS: No pneumothorax status post right thoracentesis.  Moderate right pleural effusion. Right pleural drain. Associated right lower lobe opacity, likely compressive atelectasis.  Left lung is clear.  The heart is normal in size.  IMPRESSION: No pneumothorax status post right thoracentesis.  Moderate right pleural effusion with right pleural drain.   Electronically  Signed   By: Charline Bills M.D.   On: 05/15/2014 16:36   Dg Chest  2 View  05/14/2014   CLINICAL DATA:  38 year old female with cough and pleural effusion. Ventriculoperitoneal shunt.  EXAM: CHEST  2 VIEW  COMPARISON:  05/12/2014 and prior radiographs and CTs  FINDINGS: A moderate right pleural effusion is unchanged with right lower lung atelectasis.  The left lung is clear.  The cardiomediastinal silhouette is stable.  A ventriculoperitoneal shunt is again identified overlying the right chest with tip overlying the lower right hemithorax. There is no evidence of pneumothorax.  IMPRESSION: Unchanged moderate right pleural effusion with right basilar atelectasis. Unchanged position of ventriculoperitoneal shunt.   Electronically Signed   By: Laveda Abbe M.D.   On: 05/14/2014 20:22   Dg Chest 2 View  05/12/2014   CLINICAL DATA:  Cough and congestion.  EXAM: CHEST  2 VIEW  COMPARISON:  05/11/2014  FINDINGS: Two views of this chest demonstrate a right ventricular-pleural shunt catheter. There appears to be slightly increased right pleural fluid with compressive atelectasis. The right pleural effusion is moderate-to-large in size. The left lung remains clear. Trachea is midline. Heart size is stable.  IMPRESSION: There is a moderate-to-large sized right pleural effusion which has slightly enlarged since 05/11/2014.  Right ventricular-pleural shunt.   Electronically Signed   By: Richarda Overlie M.D.   On: 05/12/2014 17:14   Dg Chest 2 View  05/11/2014   CLINICAL DATA:  Cough and decreased breath sounds on the right  EXAM: CHEST  2 VIEW  COMPARISON:  Chest radiograph December 30, 2013 and chest CT December 30, 2013  FINDINGS: There is a persistent pleural effusion on the right which may be slightly increased. There is atelectasis and possible consolidation in the right middle and lower lobes. The left lung is clear. Heart size and pulmonary vascularity are normal. No adenopathy. There is a shunt catheter on the right which  appears coiled in the right hemi thorax.  IMPRESSION: Shunt catheter appears coiled in the right hemithorax. The sizable effusion on the right may at least in part be due to fluid collecting in the right chest from the shunt catheter. There is probable compressive atelectasis in the right middle and lower lobes. Left lung is clear.  These results will be called to the ordering clinician or representative by the Radiologist Assistant, and communication documented in the PACS or zVision Dashboard.   Electronically Signed   By: Bretta Bang M.D.   On: 05/11/2014 16:38   Dg Abd 1 View  05/12/2014   CLINICAL DATA:  Cough and congestion, fever, VP shunt  EXAM: ABDOMEN - 1 VIEW  COMPARISON:  None.  FINDINGS: The bowel gas pattern is normal. No radio-opaque calculi or other significant radiographic abnormality are seen. Mild stool volume. No VP shunt tubing is identified. IUD in place over the pelvis.  IMPRESSION: Negative.   Electronically Signed   By: Christiana Pellant M.D.   On: 05/12/2014 17:12   Ct Head Wo Contrast  05/12/2014   CLINICAL DATA:  Headache.  History of VP shunt.  EXAM: CT HEAD WITHOUT CONTRAST  TECHNIQUE: Contiguous axial images were obtained from the base of the skull through the vertex without intravenous contrast.  COMPARISON:  12/04/2013  FINDINGS: Stable position of the ventriculostomy catheter. Maximal Min ventricular diameter is stable at 4.2 cm. Stable prominent temporal horns. No extra-axial fluid collections are identified. The gray-white differentiation is maintained. No findings for hemispheric infarction an or intracranial hemorrhage. The brainstem and cerebellum are grossly normal and stable.  No significant bony findings.  IMPRESSION:  Stable ventriculostomy shunt catheter.  Stable appearance and size of the ventricles. No mass effect or shift.  No acute intracranial findings.   Electronically Signed   By: Loralie Champagne M.D.   On: 05/12/2014 17:01   Ct Chest W Contrast  05/12/2014    CLINICAL DATA:  Progressive right pleural effusion. Ventricular shunt  EXAM: CT CHEST WITH CONTRAST  TECHNIQUE: Multidetector CT imaging of the chest was performed during intravenous contrast administration.  CONTRAST:  75mL OMNIPAQUE IOHEXOL 300 MG/ML  SOLN  COMPARISON:  CT chest 12/30/2013  FINDINGS: Ventricular pleural shunt is present with the shunt tubing terminating in the pleural space on the right posteriorly, unchanged from the prior study. Moderate right pleural effusion has progressed. There is increased loculated fluid anteriorly in the base on the right. Progression of right lower lobe atelectasis.  Negative for pleural effusion on the left. No mass or adenopathy is detected.  The aortic arch is normal.  Pulmonary arteries opacified normally.  IMPRESSION: Mild increase in right pleural effusion with a loculated component anteriorly. Increase in right lower lobe compressive atelectasis.  Left lung remains clear.   Electronically Signed   By: Marlan Palau M.D.   On: 05/12/2014 21:02   Dg Chest Port 1 View  05/12/2014   CLINICAL DATA:  Post thoracentesis  EXAM: PORTABLE CHEST - 1 VIEW  COMPARISON:  05/12/2014  FINDINGS: There is small to moderate right pleural effusion with right basilar atelectasis or infiltrate. No pulmonary edema. No pneumothorax. Right ventricular pleural catheter is stable in position.  IMPRESSION: Small to moderate right pleural effusion with right basilar atelectasis or infiltrate. No pulmonary edema. No pneumothorax.   Electronically Signed   By: Natasha Mead M.D.   On: 05/12/2014 20:01   US Thoracentesis Asp Pleural Space W/img Guide  05/15/2014   CLINICAL DATA:  Right pleural effusion. Right ventricular pleural shunt.  EXAM: ULTRASOUND GUIDED RIGHT THORACENTESIS  COMPARISON:  CT 05/12/2014  PROCEDURE: An ultrasound guided thoracentesis was thoroughly discussed with the patient and questions answered. The benefits, risks, alternatives and complications were also discussed.  The patient understands and wishes to proceed with the procedure. Written consent was obtained.  Ultrasound was performed to localize and mark an adequate pocket of fluid in the right posterior chest. The area was then prepped and draped in the normal sterile fashion. 1% Lidocaine was used for local anesthesia. Under ultrasound guidance a 19 gauge Yueh catheter was introduced. Thoracentesis was performed. The catheter was removed and a dressing applied.  Complications:  None.  FINDINGS: A total of approximately 600 mL of yellow fluid was removed. A fluid sample wassent for laboratory analysis. The right pleural fluid is complex with septations.  IMPRESSION: Successful ultrasound guided right thoracentesis yielding 600 ml of pleural fluid.   Electronically Signed   By: Richarda Overlie M.D.   On: 05/15/2014 16:30         Subjective: Patient complains of increasing cough today. Denies any hemoptysis or sputum production. Denies fevers, chills, chest pain shortness breath, nausea, vomiting, diarrhea. No headache or visual disturbance.  Objective: Filed Vitals:   05/17/14 1507 05/17/14 2059 05/18/14 0430 05/18/14 1401  BP: 96/61 107/75 98/67 104/68  Pulse: 92 83 78 95  Temp: 98.2 F (36.8 C) 98.7 F (37.1 C) 98.3 F (36.8 C) 97.8 F (36.6 C)  TempSrc: Oral Oral Oral Oral  Resp: 16 20 18 18   Height:      Weight:      SpO2: 95% 96%  96% 96%    Intake/Output Summary (Last 24 hours) at 05/18/14 1900 Last data filed at 05/18/14 1833  Gross per 24 hour  Intake    950 ml  Output      0 ml  Net    950 ml   Weight change:  Exam:   General:  Pt is alert, follows commands appropriately, not in acute distress  HEENT: No icterus, No thrush, [place]/AT  Cardiovascular: RRR, S1/S2, no rubs, no gallops  Respiratory: Diminished breath sounds right base. L-CTA  Abdomen: Soft/+BS, non tender, non distended, no guarding  Extremities: No edema, No lymphangitis, No petechiae, No rashes, no  synovitis  Data Reviewed: Basic Metabolic Panel:  Recent Labs Lab 05/12/14 1617 05/13/14 0753 05/16/14 0526  NA 142 141 141  K 3.5* 3.6* 4.0  CL 105 105 104  CO2 26 25 26   GLUCOSE 92 80 99  BUN 12 6 11   CREATININE 0.62 0.60 0.69  CALCIUM 8.5 8.5 8.8   Liver Function Tests:  Recent Labs Lab 05/13/14 0753 05/15/14 1835  AST 13 17  ALT 14 19  ALKPHOS 79 84  BILITOT 0.9 0.3  PROT 6.7 7.3  ALBUMIN 2.7* 2.9*   No results found for this basename: LIPASE, AMYLASE,  in the last 168 hours No results found for this basename: AMMONIA,  in the last 168 hours CBC:  Recent Labs Lab 05/12/14 1617 05/13/14 0727 05/14/14 0529 05/16/14 0526  WBC 6.9 5.9 10.6* 7.7  NEUTROABS 4.6  --   --   --   HGB 11.9* 12.3 13.1 12.5  HCT 37.0 38.4 40.3 38.6  MCV 85.3 85.7 85.4 83.4  PLT 323 319 344 354   Cardiac Enzymes: No results found for this basename: CKTOTAL, CKMB, CKMBINDEX, TROPONINI,  in the last 168 hours BNP: No components found with this basename: POCBNP,  CBG: No results found for this basename: GLUCAP,  in the last 168 hours  Recent Results (from the past 240 hour(s))  BODY FLUID CULTURE     Status: None   Collection Time    05/12/14  7:50 PM      Result Value Ref Range Status   Specimen Description PLEURAL FLUID RIGHT   Final   Special Requests NONE   Final   Gram Stain     Final   Value: RARE WBC PRESENT,BOTH PMN AND MONONUCLEAR     NO ORGANISMS SEEN     Performed at [facility]   Culture     Final   Value: NO GROWTH 3 DAYS     Performed at [facility]   Report Status 05/16/2014 FINAL   Final  AFB CULTURE WITH SMEAR     Status: None   Collection Time    05/12/14  7:50 PM      Result Value Ref Range Status   Specimen Description PLEURAL FLUID RIGHT   Final   Special Requests NONE   Final   Acid Fast Smear     Final   Value: NO ACID FAST BACILLI SEEN     Performed at [facility]   Culture     Final   Value: CULTURE WILL BE  EXAMINED FOR 6 WEEKS BEFORE ISSUING A FINAL REPORT     Performed at [facility]   Report Status PENDING   Incomplete  FUNGUS CULTURE W SMEAR     Status: None   Collection Time    05/12/14  7:50 PM  Result Value Ref Range Status   Specimen Description PLEURAL FLUID RIGHT   Final   Special Requests NONE   Final   Fungal Smear     Final   Value: NO YEAST OR FUNGAL ELEMENTS SEEN     Performed at [facility]   Culture     Final   Value: CULTURE IN PROGRESS FOR FOUR WEEKS     Performed at [facility]   Report Status PENDING   Incomplete  CULTURE, BLOOD (ROUTINE X 2)     Status: None   Collection Time    05/12/14  8:15 PM      Result Value Ref Range Status   Specimen Description BLOOD RIGHT ARM   Final   Special Requests BOTTLES DRAWN AEROBIC AND ANAEROBIC 6CC   Final   Culture  Setup Time     Final   Value: 05/13/2014 00:24     Performed at [facility]   Culture     Final   Value:        BLOOD CULTURE RECEIVED NO GROWTH TO DATE CULTURE WILL BE HELD FOR 5 DAYS BEFORE ISSUING A FINAL NEGATIVE REPORT     Performed at [facility]   Report Status PENDING   Incomplete  CULTURE, BLOOD (ROUTINE X 2)     Status: None   Collection Time    05/12/14  8:20 PM      Result Value Ref Range Status   Specimen Description BLOOD RIGHT HAND   Final   Special Requests BOTTLES DRAWN AEROBIC ONLY 10CC   Final   Culture  Setup Time     Final   Value: 05/13/2014 00:23     Performed at [facility]   Culture     Final   Value:        BLOOD CULTURE RECEIVED NO GROWTH TO DATE CULTURE WILL BE HELD FOR 5 DAYS BEFORE ISSUING A FINAL NEGATIVE REPORT     Performed at [facility]   Report Status PENDING   Incomplete  CSF CULTURE     Status: None   Collection Time    05/14/14  3:04 PM      Result Value Ref Range Status   Specimen Description CSF   Final   Special Requests NONE   Final   Gram Stain     Final   Value: WBC PRESENT, PREDOMINANTLY  MONONUCLEAR     NO ORGANISMS SEEN     CYTOSPIN Performed at [facility]     Performed at [facility]   Culture     Final   Value: NO GROWTH 3 DAYS     Performed at [facility]   Report Status 05/18/2014 FINAL   Final  GRAM STAIN     Status: None   Collection Time    05/14/14  3:04 PM      Result Value Ref Range Status   Specimen Description CSF   Final   Special Requests NONE   Final   Gram Stain     Final   Value: CYTOSPIN PREP     WBC PRESENT, PREDOMINANTLY MONONUCLEAR     NO ORGANISMS SEEN   Report Status 05/14/2014 FINAL   Final  AFB CULTURE WITH SMEAR     Status: None   Collection Time    05/15/14  4:10 PM      Result Value Ref Range Status   Specimen Description PLEURAL RIGHT FLUID  Final   Special Requests NONE   Final   Acid Fast Smear     Final   Value: NO ACID FAST BACILLI SEEN     [facility]   Culture     Final   Value: CULTURE WILL BE EXAMINED FOR 6 WEEKS BEFORE ISSUING A FINAL REPORT     [facility]   Report Status PENDING   Incomplete  FUNGUS CULTURE W SMEAR     Status: None   Collection Time    05/15/14  4:10 PM      Result Value Ref Range Status   Specimen Description PLEURAL RIGHT   Final   Special Requests NONE   Final   Fungal Smear     Final   Value: NO YEAST OR FUNGAL ELEMENTS SEEN     [facility]   Culture     Final   Value: CULTURE IN PROGRESS FOR FOUR WEEKS     [facility]   Report Status PENDING   Incomplete  BODY FLUID CULTURE     Status: None   Collection Time    05/15/14  4:10 PM      Result Value Ref Range Status   Specimen Description PLEURAL RIGHT FLUID   Final   Special Requests NONE   Final   Gram Stain     Final   Value: MODERATE WBC PRESENT,BOTH PMN AND MONONUCLEAR     NO ORGANISMS SEEN     [facility]   Culture     Final   Value: NO GROWTH 2 DAYS     [facility]    Report Status PENDING   Incomplete     Scheduled Meds: . cefTRIAXone (ROCEPHIN)  IV  2 g Intravenous Q12H  . feeding supplement (ENSURE COMPLETE)  237 mL Oral BID BM  . heparin  5,000 Units Subcutaneous 3 times per day  . metroNIDAZOLE  500 mg Oral 3 times per day  . senna  1 tablet Oral BID  . sodium chloride  3 mL Intravenous Q12H  . vancomycin  1,250 mg Intravenous Q8H   Continuous Infusions:    Lisa Schauer, DO  [facility] Pager [phone]  If 7PM-7AM, please contact night-coverage [email or web address] Password [email or web address] 05/18/2014, 7:00 PM   LOS: 6 days

## 2014-05-18 NOTE — Progress Notes (Signed)
ANTIBIOTIC CONSULT NOTE - Follow-up  Pharmacy Consult for Vancomycin Indication: R/o pleural space/VP shunt infection  No Known Allergies  Patient Measurements: Height: 5\' 6"  (167.6 cm) Weight: 143 lb 4.8 oz (65 kg) IBW/kg (Calculated) : 59.3  Vital Signs: Temp: 98.3 F (36.8 C) (08/11 0430) Temp src: Oral (08/11 0430) BP: 98/67 mmHg (08/11 0430) Pulse Rate: 78 (08/11 0430) Intake/Output from previous day: 08/10 0701 - 08/11 0700 In: 1310 [P.O.:360; IV Piggyback:950] Out: -  Intake/Output from this shift:    Labs:  Recent Labs  05/16/14 0526  WBC 7.7  HGB 12.5  PLT 354  CREATININE 0.69   Estimated Creatinine Clearance: 87.5 ml/min (by C-G formula based on Cr of 0.69).  Recent Labs  05/18/14 1108  VANCOTROUGH 8.8*     Microbiology: Recent Results (from the past 720 hour(s))  BODY FLUID CULTURE     Status: None   Collection Time    05/12/14  7:50 PM      Result Value Ref Range Status   Specimen Description PLEURAL FLUID RIGHT   Final   Special Requests NONE   Final   Gram Stain     Final   Value: RARE WBC PRESENT,BOTH PMN AND MONONUCLEAR     NO ORGANISMS SEEN     Performed at [facility]   Culture     Final   Value: NO GROWTH 3 DAYS     Performed at [facility]   Report Status 05/16/2014 FINAL   Final  AFB CULTURE WITH SMEAR     Status: None   Collection Time    05/12/14  7:50 PM      Result Value Ref Range Status   Specimen Description PLEURAL FLUID RIGHT   Final   Special Requests NONE   Final   Acid Fast Smear     Final   Value: NO ACID FAST BACILLI SEEN     Performed at [facility]   Culture     Final   Value: CULTURE WILL BE EXAMINED FOR 6 WEEKS BEFORE ISSUING A FINAL REPORT     Performed at [facility]   Report Status PENDING   Incomplete  FUNGUS CULTURE W SMEAR     Status: None   Collection Time    05/12/14  7:50 PM      Result Value Ref Range Status   Specimen Description PLEURAL FLUID RIGHT   Final    Special Requests NONE   Final   Fungal Smear     Final   Value: NO YEAST OR FUNGAL ELEMENTS SEEN     Performed at [facility]   Culture     Final   Value: CULTURE IN PROGRESS FOR FOUR WEEKS     Performed at [facility]   Report Status PENDING   Incomplete  CULTURE, BLOOD (ROUTINE X 2)     Status: None   Collection Time    05/12/14  8:15 PM      Result Value Ref Range Status   Specimen Description BLOOD RIGHT ARM   Final   Special Requests BOTTLES DRAWN AEROBIC AND ANAEROBIC [facility]   Final   Culture  Setup Time     Final   Value: 05/13/2014 00:24     Performed at [facility]   Culture     Final   Value:        BLOOD CULTURE RECEIVED NO GROWTH TO DATE CULTURE WILL BE HELD FOR 5  DAYS BEFORE ISSUING A FINAL NEGATIVE REPORT     [facility]   Report Status PENDING   Incomplete  CULTURE, BLOOD (ROUTINE X 2)     Status: None   Collection Time    05/12/14  8:20 PM      Result Value Ref Range Status   Specimen Description BLOOD RIGHT HAND   Final   Special Requests BOTTLES DRAWN AEROBIC ONLY 10CC   Final   Culture  Setup Time     Final   Value: 05/13/2014 00:23     [facility]   Culture     Final   Value:        BLOOD CULTURE RECEIVED NO GROWTH TO DATE CULTURE WILL BE HELD FOR 5 DAYS BEFORE ISSUING A FINAL NEGATIVE REPORT     [facility]   Report Status PENDING   Incomplete  CSF CULTURE     Status: None   Collection Time    05/14/14  3:04 PM      Result Value Ref Range Status   Specimen Description CSF   Final   Special Requests NONE   Final   Gram Stain     Final   Value: WBC PRESENT, PREDOMINANTLY MONONUCLEAR     NO ORGANISMS SEEN     [facility]PIN [facility]     [facility]   Culture     Final   Value: NO GROWTH 3 DAYS     [facility]   Report Status 05/18/2014 FINAL   Final  GRAM STAIN     Status: None   Collection Time     05/14/14  3:04 PM      Result Value Ref Range Status   Specimen Description CSF   Final   Special Requests NONE   Final   Gram Stain     Final   Value: CYTOSPIN PREP     WBC PRESENT, PREDOMINANTLY MONONUCLEAR     NO ORGANISMS SEEN   Report Status 05/14/2014 FINAL   Final  AFB CULTURE WITH SMEAR     Status: None   Collection Time    05/15/14  4:10 PM      Result Value Ref Range Status   Specimen Description PLEURAL RIGHT FLUID   Final   Special Requests NONE   Final   Acid Fast Smear     Final   Value: NO ACID FAST BACILLI SEEN     [facility]   Culture     Final   Value: CULTURE WILL BE EXAMINED FOR 6 WEEKS BEFORE ISSUING A FINAL REPORT     [facility]   Report Status PENDING   Incomplete  FUNGUS CULTURE W SMEAR     Status: None   Collection Time    05/15/14  4:10 PM      Result Value Ref Range Status   Specimen Description PLEURAL RIGHT   Final   Special Requests NONE   Final   Fungal Smear     Final   Value: NO YEAST OR FUNGAL ELEMENTS SEEN     [facility]   Culture     Final   Value: CULTURE IN PROGRESS FOR FOUR WEEKS     [facility]   Report Status PENDING   Incomplete  BODY FLUID CULTURE  Status: None   Collection Time    05/15/14  4:10 PM      Result Value Ref Range Status   Specimen Description PLEURAL RIGHT FLUID   Final   Special Requests NONE   Final   Gram Stain     Final   Value: MODERATE WBC PRESENT,BOTH PMN AND MONONUCLEAR     NO ORGANISMS SEEN     Performed at [facility]   Culture     Final   Value: NO GROWTH 2 DAYS     Performed at [facility]   Report Status PENDING   Incomplete   Assessment: 21 YOF with history of hydrocephalus with ventriculopleural shunt (revised in Feb 2015). Pt continues on broad spectrum antibiotics (Day #3) for possible shunt infection. ID following. Noted plans to continue antibiotics pending final cultures and  shunt revision. Shunt revision planned for Wed or Thur per neurosurgery. SCr remains stable.   Vancomycin trough 8.8 mcg/ml (subtherapeutic) on 750mg  IV q8h.   Goal of Therapy:  Vancomycin trough level 15-20 mcg/ml  Plan:  1. Change Vancomycin to 1250 mg IV every 8 hours 2. Will continue to follow renal function, culture results, LOT, and antibiotic de-escalation plans  3. Will consider level at new Css if continues  Christoper Fabian, PharmD, BCPS Clinical pharmacist, pager [phone] 05/18/2014 12:02 PM

## 2014-05-19 DIAGNOSIS — T85695A Other mechanical complication of other nervous system device, implant or graft, initial encounter: Secondary | ICD-10-CM

## 2014-05-19 LAB — CULTURE, BLOOD (ROUTINE X 2)
CULTURE: NO GROWTH
CULTURE: NO GROWTH

## 2014-05-19 LAB — BODY FLUID CULTURE: Culture: NO GROWTH

## 2014-05-19 LAB — ADENOSINE DEAMINASE, FLUID: Adenosine Deaminase, Fluid: 13.7 U/L — ABNORMAL HIGH (ref <7.6)

## 2014-05-19 MED ORDER — CEFAZOLIN SODIUM-DEXTROSE 2-3 GM-% IV SOLR
2.0000 g | INTRAVENOUS | Status: DC
  Filled 2014-05-19: qty 50

## 2014-05-19 NOTE — Progress Notes (Signed)
PATIENT DETAILS Name: [name] Age: 38 y.o. 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 The patient underwent a revision performed by Dr. Tressie Stalker on 12/03/13. Since that period of time, the patient has complained of headache with intermittent nausea with nonproductive cough. She presented to the emergency department on 05/12/2014 with progressive headache, worsening cough and shortness of breath. Chest x-ray in the emergency department revealed her worsening right-sided pleural effusion. CT of the brain did not show any hydrocephalus. Pulmonology was consulted. The patient underwent initial thoracocentesis on 05/12/2014. Although the cultures from the thoracocentesis were negative, the fluid studies were consistent with an exudate. After the thoracocentesis, the patient's breathing and headache improved. Dr. Lovell Sheehan (neurosurgery) saw the patient and a shunt tap was performed on 05/14/2014. The CSF cultures were negative and cell count was only 1 WBC with normal protein and glucose. Infectious disease was consulted. Initially, they did not recommend starting the patient on antibiotics at this point as the patient is afebrile and clinically stable. Repeat thoracocentesis was performed on 05/15/2014. Once again, the fluid was consistent with an exudate although the cultures remained negative. Multiple studies were sent from the pleural fluid including cytology, adenosine deaminase, MTB PCR, bacterial, fungal, and AFB cultures. All consultants felt that there was some degree of VP shunt dysfunction. As a result, Dr. Lovell Sheehan plans to revise the shunt on 05/19/2014 or 05/20/2014. Because the repeat thoracocentesis showed a glucose less than 20, there was concern for a consumptive process  in the patient's pleural cavity. As a result, infectious disease started the patient on intravenous antibiotics on 05/16/14.   Subjective: Mild headache, but otherwise no complaints  Assessment/Plan: Right Pleural Effusion with possible ventriculopleural shunt infection - Patient was admitted with concerns for ventriculopleural shunt malfunction and subsequent infection giving her clinical symptoms of low-grade fever and headache. Patient was admitted and monitored off antibiotics initially, underwent thoracocentesis 8/5 and 8/8, fluid consistent with exudative pathology, all cultures negative so far. Patient also underwent shunt tap which had a benign CSF findings.Patient was seen in consultation by pulmonary critical care, infectious disease and neurosurgery, current consensus is for possible VP shunt infection, as a result patient was empirically started on vancomycin, Rocephin and Flagyl on 8/9. Plans are for shunt revision on 8/13.  Disposition: Remain inpatient  DVT Prophylaxis: Prophylactic  Heparin   Code Status: Full code   Family Communication Husband at bedside  Procedures:  Thoraccentesis 8/5 and 8/9  VP shunt tap 8/7  CONSULTS:  pulmonary/intensive care, neurology and Neurosurgery   MEDICATIONS: Scheduled Meds: . cefTRIAXone (ROCEPHIN)  IV  2 g Intravenous Q12H  . feeding supplement (ENSURE COMPLETE)  237 mL Oral BID BM  . heparin  5,000 Units Subcutaneous 3 times per day  . metroNIDAZOLE  500 mg Oral 3 times per day  . senna  1 tablet Oral BID  . sodium chloride  3 mL Intravenous Q12H  . vancomycin  1,250 mg Intravenous Q8H   Continuous Infusions:  PRN Meds:.sodium chloride, acetaminophen, acetaminophen, albuterol, alum & mag hydroxide-simeth, diphenhydrAMINE, HYDROcodone-homatropine, morphine injection, ondansetron (ZOFRAN) IV, ondansetron, oxyCODONE, sodium chloride  Antibiotics: Anti-infectives   Start     Dose/Rate Route Frequency Ordered Stop    05/18/14 1300  vancomycin (VANCOCIN) 1,250 mg in sodium chloride 0.9 %  250 mL IVPB     1,250 mg 166.7 mL/hr over 90 Minutes Intravenous Every 8 hours 05/18/14 1210     05/16/14 2000  vancomycin (VANCOCIN) IVPB 750 mg/150 ml premix  Status:  Discontinued     750 mg 150 mL/hr over 60 Minutes Intravenous Every 8 hours 05/16/14 1754 05/18/14 1210   05/16/14 1830  cefTRIAXone (ROCEPHIN) 2 g in dextrose 5 % 50 mL IVPB     2 g 100 mL/hr over 30 Minutes Intravenous Every 12 hours 05/16/14 1721     05/16/14 1730  metroNIDAZOLE (FLAGYL) tablet 500 mg     500 mg Oral 3 times per day 05/16/14 1721         PHYSICAL EXAM: Vital signs in last 24 hours: Filed Vitals:   05/18/14 0430 05/18/14 1401 05/18/14 2300 05/19/14 0519  BP: 98/67 104/68 99/64 99/62   Pulse: 78 95 100 87  Temp: 98.3 F (36.8 C) 97.8 F (36.6 C) 97.3 F (36.3 C) 98.3 F (36.8 C)  TempSrc: Oral Oral Oral Oral  Resp: 18 18 18 18   Height:      Weight:      SpO2: 96% 96% 95% 96%    Weight change:  Filed Weights   05/12/14 2052  Weight: 65 kg (143 lb 4.8 oz)   Body mass index is 23.14 kg/(m^2).   Gen Exam: Awake and alert with clear speech.   Neck: Supple, No JVD.   Chest: B/L Clear.   CVS: S1 S2 Regular, no murmurs.  Abdomen: soft, BS +, non tender, non distended.  Extremities: no edema, lower extremities warm to touch. Neurologic: Non Focal.   Skin: No Rash.   Wounds: N/A.   Intake/Output from previous day:  Intake/Output Summary (Last 24 hours) at 05/19/14 1246 Last data filed at 05/18/14 2114  Gross per 24 hour  Intake    660 ml  Output      0 ml  Net    660 ml     LAB RESULTS: CBC  Recent Labs Lab 05/12/14 1617 05/13/14 0727 05/14/14 0529 05/16/14 0526  WBC 6.9 5.9 10.6* 7.7  HGB 11.9* 12.3 13.1 12.5  HCT 37.0 38.4 40.3 38.6  PLT 323 319 344 354  MCV 85.3 85.7 85.4 83.4  MCH 27.4 27.5 27.8 27.0  MCHC 32.2 32.0 32.5 32.4  RDW 13.3 13.3 13.4 13.5  LYMPHSABS 1.5  --   --   --   MONOABS  0.6  --   --   --   EOSABS 0.2  --   --   --   BASOSABS 0.1  --   --   --     Chemistries   Recent Labs Lab 05/12/14 1617 05/13/14 0753 05/16/14 0526  NA 142 141 141  K 3.5* 3.6* 4.0  CL 105 105 104  CO2 26 25 26   GLUCOSE 92 80 99  BUN 12 6 11   CREATININE 0.62 0.60 0.69  CALCIUM 8.5 8.5 8.8    CBG: No results found for this basename: GLUCAP,  in the last 168 hours  GFR Estimated Creatinine Clearance: 87.5 ml/min (by C-G formula based on Cr of 0.69).  Coagulation profile No results found for this basename: INR, PROTIME,  in the last 168 hours  Cardiac Enzymes No results found for this basename: CK, CKMB, TROPONINI, MYOGLOBIN,  in the last 168 hours  No components found with this basename: POCBNP,  No results found for this basename: DDIMER,  in the last 72 hours  No results found for this basename: HGBA1C,  in the last 72 hours No results found for this basename: CHOL, HDL, LDLCALC, TRIG, CHOLHDL, LDLDIRECT,  in the last 72 hours No results found for this basename: TSH, T4TOTAL, FREET3, T3FREE, THYROIDAB,  in the last 72 hours No results found for this basename: VITAMINB12, FOLATE, FERRITIN, TIBC, IRON, RETICCTPCT,  in the last 72 hours No results found for this basename: LIPASE, AMYLASE,  in the last 72 hours  Urine Studies No results found for this basename: UACOL, UAPR, USPG, UPH, UTP, UGL, UKET, UBIL, UHGB, UNIT, UROB, ULEU, UEPI, UWBC, URBC, UBAC, CAST, CRYS, UCOM, BILUA,  in the last 72 hours  MICROBIOLOGY: Recent Results (from the past 240 hour(s))  BODY FLUID CULTURE     Status: None   Collection Time    05/12/14  7:50 PM      Result Value Ref Range Status   Specimen Description PLEURAL FLUID RIGHT   Final   Special Requests NONE   Final   Gram Stain     Final   Value: RARE WBC PRESENT,BOTH PMN AND MONONUCLEAR     NO ORGANISMS SEEN     Performed at [facility]   Culture     Final   Value: NO GROWTH 3 DAYS     Performed at [facility]     Report Status 05/16/2014 FINAL   Final  AFB CULTURE WITH SMEAR     Status: None   Collection Time    05/12/14  7:50 PM      Result Value Ref Range Status   Specimen Description PLEURAL FLUID RIGHT   Final   Special Requests NONE   Final   Acid Fast Smear     Final   Value: NO ACID FAST BACILLI SEEN     Performed at [facility]   Culture     Final   Value: CULTURE WILL BE EXAMINED FOR 6 WEEKS BEFORE ISSUING A FINAL REPORT     Performed at [facility]   Report Status PENDING   Incomplete  FUNGUS CULTURE W SMEAR     Status: None   Collection Time    05/12/14  7:50 PM      Result Value Ref Range Status   Specimen Description PLEURAL FLUID RIGHT   Final   Special Requests NONE   Final   Fungal Smear     Final   Value: NO YEAST OR FUNGAL ELEMENTS SEEN     Performed at [facility]   Culture     Final   Value: CULTURE IN PROGRESS FOR FOUR WEEKS     Performed at [facility]   Report Status PENDING   Incomplete  CULTURE, BLOOD (ROUTINE X 2)     Status: None   Collection Time    05/12/14  8:15 PM      Result Value Ref Range Status   Specimen Description BLOOD RIGHT ARM   Final   Special Requests BOTTLES DRAWN AEROBIC AND ANAEROBIC [facility]   Final   Culture  Setup Time     Final   Value: 05/13/2014 00:24     Performed at [facility]   Culture     Final   Value: NO GROWTH 5 DAYS     Performed at [facility]   Report Status 05/19/2014 FINAL   Final  CULTURE, BLOOD (ROUTINE X 2)     Status: None   Collection Time  05/12/14  8:20 PM      Result Value Ref Range Status   Specimen Description BLOOD RIGHT HAND   Final   Special Requests BOTTLES DRAWN AEROBIC ONLY 10CC   Final   Culture  Setup Time     Final   Value: 05/13/2014 00:23     Performed at [facility]   Culture     Final   Value: NO GROWTH 5 DAYS     Performed at [facility]   Report Status 05/19/2014 FINAL   Final  CSF CULTURE     Status: None    Collection Time    05/14/14  3:04 PM      Result Value Ref Range Status   Specimen Description CSF   Final   Special Requests NONE   Final   Gram Stain     Final   Value: WBC PRESENT, PREDOMINANTLY MONONUCLEAR     NO ORGANISMS SEEN     CYTOSPIN Performed at [facility]     Performed at [facility]   Culture     Final   Value: NO GROWTH 3 DAYS     Performed at [facility]   Report Status 05/18/2014 FINAL   Final  GRAM STAIN     Status: None   Collection Time    05/14/14  3:04 PM      Result Value Ref Range Status   Specimen Description CSF   Final   Special Requests NONE   Final   Gram Stain     Final   Value: CYTOSPIN PREP     WBC PRESENT, PREDOMINANTLY MONONUCLEAR     NO ORGANISMS SEEN   Report Status 05/14/2014 FINAL   Final  AFB CULTURE WITH SMEAR     Status: None   Collection Time    05/15/14  4:10 PM      Result Value Ref Range Status   Specimen Description PLEURAL RIGHT FLUID   Final   Special Requests NONE   Final   Acid Fast Smear     Final   Value: NO ACID FAST BACILLI SEEN     Performed at [facility]   Culture     Final   Value: CULTURE WILL BE EXAMINED FOR 6 WEEKS BEFORE ISSUING A FINAL REPORT     Performed at [facility]   Report Status PENDING   Incomplete  FUNGUS CULTURE W SMEAR     Status: None   Collection Time    05/15/14  4:10 PM      Result Value Ref Range Status   Specimen Description PLEURAL RIGHT   Final   Special Requests NONE   Final   Fungal Smear     Final   Value: NO YEAST OR FUNGAL ELEMENTS SEEN     Performed at [facility]   Culture     Final   Value: CULTURE IN PROGRESS FOR FOUR WEEKS     Performed at [facility]   Report Status PENDING   Incomplete  BODY FLUID CULTURE     Status: None   Collection Time    05/15/14  4:10 PM      Result Value Ref Range Status   Specimen Description PLEURAL RIGHT FLUID   Final   Special Requests NONE   Final   Gram Stain     Final    Value: MODERATE WBC PRESENT,BOTH PMN AND MONONUCLEAR     NO  ORGANISMS SEEN     [facility]   Culture     Final   Value: NO GROWTH 3 DAYS     [facility]   Report Status 05/19/2014 FINAL   Final    RADIOLOGY STUDIES/RESULTS: Dg Chest 1 View  05/15/2014   CLINICAL DATA:  Post right thoracentesis  EXAM: CHEST - 1 VIEW  COMPARISON:  05/14/2014  FINDINGS: No pneumothorax status post right thoracentesis.  Moderate right pleural effusion. Right pleural drain. Associated right lower lobe opacity, likely compressive atelectasis.  Left lung is clear.  The heart is normal in size.  IMPRESSION: No pneumothorax status post right thoracentesis.  Moderate right pleural effusion with right pleural drain.   Electronically Signed   By: Charline BillsSriyesh  Krishnan M.D.   On: 05/15/2014 16:36   Dg Chest 2 View  05/14/2014   CLINICAL DATA:  38 year old female with cough and pleural effusion. Ventriculoperitoneal shunt.  EXAM: CHEST  2 VIEW  COMPARISON:  05/12/2014 and prior radiographs and CTs  FINDINGS: A moderate right pleural effusion is unchanged with right lower lung atelectasis.  The left lung is clear.  The cardiomediastinal silhouette is stable.  A ventriculoperitoneal shunt is again identified overlying the right chest with tip overlying the lower right hemithorax. There is no evidence of pneumothorax.  IMPRESSION: Unchanged moderate right pleural effusion with right basilar atelectasis. Unchanged position of ventriculoperitoneal shunt.   Electronically Signed   By: Laveda AbbeJeff  Hu M.D.   On: 05/14/2014 20:22   Dg Chest 2 View  05/12/2014   CLINICAL DATA:  Cough and congestion.  EXAM: CHEST  2 VIEW  COMPARISON:  05/11/2014  FINDINGS: Two views of this chest demonstrate a right ventricular-pleural shunt catheter. There appears to be slightly increased right pleural fluid with compressive atelectasis. The right pleural effusion is moderate-to-large in size. The left lung remains clear.  Trachea is midline. Heart size is stable.  IMPRESSION: There is a moderate-to-large sized right pleural effusion which has slightly enlarged since 05/11/2014.  Right ventricular-pleural shunt.   Electronically Signed   By: Richarda OverlieAdam  Henn M.D.   On: 05/12/2014 17:14   Dg Chest 2 View  05/11/2014   CLINICAL DATA:  Cough and decreased breath sounds on the right  EXAM: CHEST  2 VIEW  COMPARISON:  Chest radiograph December 30, 2013 and chest CT December 30, 2013  FINDINGS: There is a persistent pleural effusion on the right which may be slightly increased. There is atelectasis and possible consolidation in the right middle and lower lobes. The left lung is clear. Heart size and pulmonary vascularity are normal. No adenopathy. There is a shunt catheter on the right which appears coiled in the right hemi thorax.  IMPRESSION: Shunt catheter appears coiled in the right hemithorax. The sizable effusion on the right may at least in part be due to fluid collecting in the right chest from the shunt catheter. There is probable compressive atelectasis in the right middle and lower lobes. Left lung is clear.  These results will be called to the ordering clinician or representative by the Radiologist Assistant, and communication documented in the PACS or zVision Dashboard.   Electronically Signed   By: Bretta BangWilliam  Woodruff M.D.   On: 05/11/2014 16:38   Dg Abd 1 View  05/12/2014   CLINICAL DATA:  Cough and congestion, fever, VP shunt  EXAM: ABDOMEN - 1 VIEW  COMPARISON:  None.  FINDINGS: The bowel gas pattern is normal. No radio-opaque calculi  or other significant radiographic abnormality are seen. Mild stool volume. No VP shunt tubing is identified. IUD in place over the pelvis.  IMPRESSION: Negative.   Electronically Signed   By: Christiana Pellant M.D.   On: 05/12/2014 17:12   Ct Head Wo Contrast  05/12/2014   CLINICAL DATA:  Headache.  History of VP shunt.  EXAM: CT HEAD WITHOUT CONTRAST  TECHNIQUE: Contiguous axial images were obtained  from the base of the skull through the vertex without intravenous contrast.  COMPARISON:  12/04/2013  FINDINGS: Stable position of the ventriculostomy catheter. Maximal Min ventricular diameter is stable at 4.2 cm. Stable prominent temporal horns. No extra-axial fluid collections are identified. The gray-white differentiation is maintained. No findings for hemispheric infarction an or intracranial hemorrhage. The brainstem and cerebellum are grossly normal and stable.  No significant bony findings.  IMPRESSION: Stable ventriculostomy shunt catheter.  Stable appearance and size of the ventricles. No mass effect or shift.  No acute intracranial findings.   Electronically Signed   By: Loralie Champagne M.D.   On: 05/12/2014 17:01   Ct Chest W Contrast  05/12/2014   CLINICAL DATA:  Progressive right pleural effusion. Ventricular shunt  EXAM: CT CHEST WITH CONTRAST  TECHNIQUE: Multidetector CT imaging of the chest was performed during intravenous contrast administration.  CONTRAST:  75mL OMNIPAQUE IOHEXOL 300 MG/ML  SOLN  COMPARISON:  CT chest 12/30/2013  FINDINGS: Ventricular pleural shunt is present with the shunt tubing terminating in the pleural space on the right posteriorly, unchanged from the prior study. Moderate right pleural effusion has progressed. There is increased loculated fluid anteriorly in the base on the right. Progression of right lower lobe atelectasis.  Negative for pleural effusion on the left. No mass or adenopathy is detected.  The aortic arch is normal.  Pulmonary arteries opacified normally.  IMPRESSION: Mild increase in right pleural effusion with a loculated component anteriorly. Increase in right lower lobe compressive atelectasis.  Left lung remains clear.   Electronically Signed   By: Marlan Palau M.D.   On: 05/12/2014 21:02   Dg Chest Port 1 View  05/12/2014   CLINICAL DATA:  Post thoracentesis  EXAM: PORTABLE CHEST - 1 VIEW  COMPARISON:  05/12/2014  FINDINGS: There is small to  moderate right pleural effusion with right basilar atelectasis or infiltrate. No pulmonary edema. No pneumothorax. Right ventricular pleural catheter is stable in position.  IMPRESSION: Small to moderate right pleural effusion with right basilar atelectasis or infiltrate. No pulmonary edema. No pneumothorax.   Electronically Signed   By: Natasha Mead M.D.   On: 05/12/2014 20:01   US Thoracentesis Asp Pleural Space W/img Guide  05/15/2014   CLINICAL DATA:  Right pleural effusion. Right ventricular pleural shunt.  EXAM: ULTRASOUND GUIDED RIGHT THORACENTESIS  COMPARISON:  CT 05/12/2014  PROCEDURE: An ultrasound guided thoracentesis was thoroughly discussed with the patient and questions answered. The benefits, risks, alternatives and complications were also discussed. The patient understands and wishes to proceed with the procedure. Written consent was obtained.  Ultrasound was performed to localize and mark an adequate pocket of fluid in the right posterior chest. The area was then prepped and draped in the normal sterile fashion. 1% Lidocaine was used for local anesthesia. Under ultrasound guidance a 19 gauge Yueh catheter was introduced. Thoracentesis was performed. The catheter was removed and a dressing applied.  Complications:  None.  FINDINGS: A total of approximately 600 mL of yellow fluid was removed. A fluid sample wassent for laboratory  analysis. The right pleural fluid is complex with septations.  IMPRESSION: Successful ultrasound guided right thoracentesis yielding 600 ml of pleural fluid.   Electronically Signed   By: Richarda Overlie M.D.   On: 05/15/2014 16:30    Jeoffrey Massed, MD  [facility] Pager:[phone]  If 7PM-7AM, please contact night-coverage [email or web address] Password [email or web address] 05/19/2014, 12:46 PM   LOS: 7 days   **Disclaimer: This note may have been dictated with voice recognition software. Similar sounding words can inadvertently be transcribed and this note may contain  transcription errors which may not have been corrected upon publication of note.**

## 2014-05-19 NOTE — Progress Notes (Signed)
Patient ID: [name], female   DOB: 08/10/1976, 38 y.o.   MRN: [id number] Subjective:  The patient is alert and pleasant. She is in no apparent distress.  Objective: Vital signs in last 24 hours: Temp:  [97.3 F (36.3 C)-98.3 F (36.8 C)] 98.3 F (36.8 C) (08/12 0519) Pulse Rate:  [87-100] 87 (08/12 0519) Resp:  [18] 18 (08/12 0519) BP: (99-104)/(62-68) 99/62 mmHg (08/12 0519) SpO2:  [95 %-96 %] 96 % (08/12 0519)  Intake/Output from previous day: 08/11 0701 - 08/12 0700 In: 660 [P.O.:360; IV Piggyback:300] Out: -  Intake/Output this shift:    Physical exam patient is alert and oriented. She is moving all 4 extremities well.  Lab Results: No results found for this basename: WBC, HGB, HCT, PLT,  in the last 72 hours BMET No results found for this basename: NA, K, CL, CO2, GLUCOSE, BUN, CREATININE, CALCIUM,  in the last 72 hours  Studies/Results: No results found.  Assessment/Plan: Ventriculopleural shunt malfunction: I discussed situation with the patient and her husband, [name]. We have discussed the various treatment options including a ventriculoperitoneal shunt replacement/revision to a ventriculoperitoneal shunt preferably with the help of general surgery, Dr. Larey Brickodd Rosenbauer. I've also explained that this may not be possible and we may need to revise it to either at ventriculopleural or ventricular atrial shunt. I've explained the surgical procedure. We have discussed the risks of surgery including the risks of anesthesia, hemorrhage, infection, shunt malplacement or malfunction, intracerebral hemorrhage, etc. I have answered all their questions. They want to proceed with surgery as scheduled tomorrow morning.  LOS: 7 days     [name] 05/19/2014, 9:55 AM

## 2014-05-19 NOTE — Progress Notes (Signed)
[facility]    Date of Admission:  05/12/2014    Total days of antibiotics 4        Day 4 Rocephin        Day 4 Flagyl        Day 4 Vancomycin         Active Problems:   Pleural effusion, right   Fever   Headache   . cefTRIAXone (ROCEPHIN)  IV  2 g Intravenous Q12H  . feeding supplement (ENSURE COMPLETE)  237 mL Oral BID BM  . heparin  5,000 Units Subcutaneous 3 times per day  . metroNIDAZOLE  500 mg Oral 3 times per day  . senna  1 tablet Oral BID  . sodium chloride  3 mL Intravenous Q12H  . vancomycin  1,250 mg Intravenous Q8H    Subjective: Slight headache today but claims her cough is better than yesterday. No further complaints.    Past Medical History  Diagnosis Date  . VP (ventriculoperitoneal) shunt status   . Chronic headaches   . Chronic cough   . 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 Ventriculopleural shunt status     History  Substance Use Topics  . Smoking status: Never Smoker   . Smokeless tobacco: Never Used  . Alcohol Use: No    Family History  Problem Relation Age of Onset  . Diabetes Father     No Known Allergies  Objective: Temp:  [97.3 F (36.3 C)-98.3 F (36.8 C)] 98.3 F (36.8 C) (08/12 0519) Pulse Rate:  [87-100] 87 (08/12 0519) Resp:  [18] 18 (08/12 0519) BP: (99-104)/(62-68) 99/62 mmHg (08/12 0519) SpO2:  [95 %-96 %] 96 % (08/12 0519)  Physical Exam: General: Alert, cooperative, no acute distress. HEENT: PERRL, EOMI. VP shunt on right posterior scalp tracking down right side of neck. No overlying tenderness.  Neck: Full range of motion without pain, supple. Lungs: Decreased breath sounds on right base. Otherwise no wheezes or rales.  Heart: Regular rate and rhythm, no murmurs. Abdomen: Soft, non-tender, non-distended, bowel sounds present.  Extremities: No cyanosis, clubbing, or edema Neurologic: Alert & oriented X3, cranial nerves II-XII intact, strength grossly  intact, sensation intact to light touch   Lab Results Lab Results  Component Value Date   WBC 7.7 05/16/2014   HGB 12.5 05/16/2014   HCT 38.6 05/16/2014   MCV 83.4 05/16/2014   PLT 354 05/16/2014    Lab Results  Component Value Date   CREATININE 0.69 05/16/2014   BUN 11 05/16/2014   NA 141 05/16/2014   K 4.0 05/16/2014   CL 104 05/16/2014   CO2 26 05/16/2014    Lab Results  Component Value Date   ALT 19 05/15/2014   AST 17 05/15/2014   ALKPHOS 84 05/15/2014   BILITOT 0.3 05/15/2014      Microbiology: Recent Results (from the past 240 hour(s))  BODY FLUID CULTURE     Status: None   Collection Time    05/12/14  7:50 PM      Result Value Ref Range Status   Specimen Description PLEURAL FLUID RIGHT   Final   Special Requests NONE   Final   Gram Stain     Final   Value: RARE WBC PRESENT,BOTH PMN AND MONONUCLEAR     NO ORGANISMS SEEN     Performed at [facility]  Final   Value: NO GROWTH 3 DAYS     Performed at [facility]   Report Status 05/16/2014 FINAL   Final  AFB CULTURE WITH SMEAR     Status: None   Collection Time    05/12/14  7:50 PM      Result Value Ref Range Status   Specimen Description PLEURAL FLUID RIGHT   Final   Special Requests NONE   Final   Acid Fast Smear     Final   Value: NO ACID FAST BACILLI SEEN     Performed at [facility]   Culture     Final   Value: CULTURE WILL BE EXAMINED FOR 6 WEEKS BEFORE ISSUING A FINAL REPORT     Performed at [facility]   Report Status PENDING   Incomplete  FUNGUS CULTURE W SMEAR     Status: None   Collection Time    05/12/14  7:50 PM      Result Value Ref Range Status   Specimen Description PLEURAL FLUID RIGHT   Final   Special Requests NONE   Final   Fungal Smear     Final   Value: NO YEAST OR FUNGAL ELEMENTS SEEN     Performed at [facility]   Culture     Final   Value: CULTURE IN PROGRESS FOR FOUR WEEKS     Performed at [facility]   Report Status PENDING    Incomplete  CULTURE, BLOOD (ROUTINE X 2)     Status: None   Collection Time    05/12/14  8:15 PM      Result Value Ref Range Status   Specimen Description BLOOD RIGHT ARM   Final   Special Requests BOTTLES DRAWN AEROBIC AND ANAEROBIC 6CC   Final   Culture  Setup Time     Final   Value: 05/13/2014 00:24     Performed at [facility]   Culture     Final   Value: NO GROWTH 5 DAYS     Performed at [facility]   Report Status 05/19/2014 FINAL   Final  CULTURE, BLOOD (ROUTINE X 2)     Status: None   Collection Time    05/12/14  8:20 PM      Result Value Ref Range Status   Specimen Description BLOOD RIGHT HAND   Final   Special Requests BOTTLES DRAWN AEROBIC ONLY 10CC   Final   Culture  Setup Time     Final   Value: 05/13/2014 00:23     Performed at [facility]   Culture     Final   Value: NO GROWTH 5 DAYS     Performed at [facility]   Report Status 05/19/2014 FINAL   Final  CSF CULTURE     Status: None   Collection Time    05/14/14  3:04 PM      Result Value Ref Range Status   Specimen Description CSF   Final   Special Requests NONE   Final   Gram Stain     Final   Value: WBC PRESENT, PREDOMINANTLY MONONUCLEAR     NO ORGANISMS SEEN     CYTOSPIN Performed at [facility]     Performed at [facility]   Culture     Final   Value: NO GROWTH 3 DAYS     Performed at [facility]   Report Status 05/18/2014 FINAL  Final  GRAM STAIN     Status: None   Collection Time    05/14/14  3:04 PM      Result Value Ref Range Status   Specimen Description CSF   Final   Special Requests NONE   Final   Gram Stain     Final   Value: CYTOSPIN PREP     WBC PRESENT, PREDOMINANTLY MONONUCLEAR     NO ORGANISMS SEEN   Report Status 05/14/2014 FINAL   Final  AFB CULTURE WITH SMEAR     Status: None   Collection Time    05/15/14  4:10 PM      Result Value Ref Range Status   Specimen Description PLEURAL RIGHT FLUID   Final   Special  Requests NONE   Final   Acid Fast Smear     Final   Value: NO ACID FAST BACILLI SEEN     [facility]   Culture     Final   Value: CULTURE WILL BE EXAMINED FOR 6 WEEKS BEFORE ISSUING A FINAL REPORT     [facility]   Report Status PENDING   Incomplete  FUNGUS CULTURE W SMEAR     Status: None   Collection Time    05/15/14  4:10 PM      Result Value Ref Range Status   Specimen Description PLEURAL RIGHT   Final   Special Requests NONE   Final   Fungal Smear     Final   Value: NO YEAST OR FUNGAL ELEMENTS SEEN     [facility]   Culture     Final   Value: CULTURE IN PROGRESS FOR FOUR WEEKS     [facility]   Report Status PENDING   Incomplete  BODY FLUID CULTURE     Status: None   Collection Time    05/15/14  4:10 PM      Result Value Ref Range Status   Specimen Description PLEURAL RIGHT FLUID   Final   Special Requests NONE   Final   Gram Stain     Final   Value: MODERATE WBC PRESENT,BOTH PMN AND MONONUCLEAR     NO ORGANISMS SEEN     [facility]   Culture     Final   Value: NO GROWTH 2 DAYS     [facility] at [facility]   Report Status PENDING   Incomplete    Assessment: 38 y/o F w/ h/o hydrocephalus w/ ventriculopleural shunt (revised 11/2013), admitted for worsening SOB and headaches, found to have increased exudative (lymphocyte predominant) pleural effusion on thoracentesis. Started on Vancomycin, Flagyl, and Rocephin on 05/16/14 for concern of indolent shunt infection w/ loculated parapneumonic effusion. Seen by neurosurgery and general surgery today, patient for shunt revision in the AM (05/20/14). All cultures continue to be negative. Afebrile overnight.   Plan: 1. Continue Rocephin, Flagyl, Vancomycin 2. VP shunt revision as above  Lars MassonJones, Eden, MD PGY-2, Internal Medicine Pager: [phone] 05/19/2014, 10:18 AM  Attending addendum: Although all pleural fluid  and CSF cultures remain negative I'm still concerned about infection of the pleural and recurrent shunt. I agree with shunt revision tomorrow.  Cliffton AstersJohn Quinton Voth, MD [facility] [phone] pager   [phone] cell 05/19/2014, 1:02 PM

## 2014-05-19 NOTE — Consult Note (Signed)
Reason for Consult:  Need vp shunt Referring Physician:   Dr. Larene BeachJenkins  [name] is an 38 y.o. female.  HPI: she has hydrocephalus and had a ventriculoperitoneal shunt placed in 2001. The shunt then had to be revised and was placed into the pleural space. She's develop some problems with shunt malfunction as well as questionable infection. Revision of the shunt has been planned by Dr. Lovell SheehanJenkins and I then asked to assist with a laparoscopic-assisted placement of the catheter into the peritoneal cavity.  Past Medical History  Diagnosis Date  . VP (ventriculoperitoneal) shunt status   . Chronic headaches   . Chronic cough   . 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 Ventriculopleural shunt status     Past Surgical History  Procedure Laterality Date  . Shunt      brain  . Shunt revision ventricular-peritoneal N/A 12/03/2013    Procedure: Revision of Ventricular Peritoneal Shunt;  Surgeon: Cristi LoronJeffrey D Jenkins, MD;  Location: [facility];  Service: Neurosurgery;  Laterality: N/A;    Family History  Problem Relation Age of Onset  . Diabetes Father     Social History:  reports that she has never smoked. She has never used smokeless tobacco. She reports that she does not drink alcohol or use illicit drugs.  Allergies: No Known Allergies  Current facility-administered medications:0.9 %  sodium chloride infusion, 250 mL, Intravenous, PRN, Maretta BeesShanker M Ghimire, MD;  acetaminophen (TYLENOL) suppository 650 mg, 650 mg, Rectal, Q6H PRN, Maretta BeesShanker M Ghimire, MD;  acetaminophen (TYLENOL) tablet 650 mg, 650 mg, Oral, Q6H PRN, Maretta BeesShanker M Ghimire, MD;  albuterol (PROVENTIL) (2.5 MG/3ML) 0.083% nebulizer solution 2.5 mg, 2.5 mg, Nebulization, Q2H PRN, Maretta BeesShanker M Ghimire, MD alum & mag hydroxide-simeth (MAALOX/MYLANTA) 200-200-20 MG/5ML suspension 30 mL, 30 mL, Oral, Q6H PRN, Maretta BeesShanker M Ghimire, MD;  cefTRIAXone (ROCEPHIN) 2 g in dextrose 5 % 50 mL IVPB, 2 g, Intravenous, Q12H, Randall Hissornelius N  Van Dam, MD, 2 g at 05/18/14 2002;  diphenhydrAMINE (BENADRYL) capsule 25 mg, 25 mg, Oral, Q6H PRN, Jinger NeighborsMary A Lynch, NP, 25 mg at 05/16/14 2335 feeding supplement (ENSURE COMPLETE) (ENSURE COMPLETE) liquid 237 mL, 237 mL, Oral, BID BM, David Tat, MD, 237 mL at 05/18/14 1030;  heparin injection 5,000 Units, 5,000 Units, Subcutaneous, 3 times per day, Maretta BeesShanker M Ghimire, MD, 5,000 Units at 05/19/14 0622;  HYDROcodone-homatropine (HYCODAN) 5-1.5 MG/5ML syrup 5 mL, 5 mL, Oral, Q6H PRN, Catarina Hartshornavid Tat, MD metroNIDAZOLE (FLAGYL) tablet 500 mg, 500 mg, Oral, 3 times per day, Randall Hissornelius N Van Dam, MD, 500 mg at 05/19/14 [id number];  morphine 2 MG/ML injection 1 mg, 1 mg, Intravenous, Q4H PRN, Maretta BeesShanker M Ghimire, MD, 1 mg at 05/13/14 2018;  ondansetron (ZOFRAN) injection 4 mg, 4 mg, Intravenous, Q6H PRN, Maretta BeesShanker M Ghimire, MD;  ondansetron (ZOFRAN) tablet 4 mg, 4 mg, Oral, Q6H PRN, Maretta BeesShanker M Ghimire, MD oxyCODONE (Oxy IR/ROXICODONE) immediate release tablet 5 mg, 5 mg, Oral, Q4H PRN, Maretta BeesShanker M Ghimire, MD, 5 mg at 05/13/14 1023;  senna (SENOKOT) tablet 8.6 mg, 1 tablet, Oral, BID, Maretta BeesShanker M Ghimire, MD, 8.6 mg at 05/18/14 1030;  sodium chloride 0.9 % injection 3 mL, 3 mL, Intravenous, Q12H, Maretta BeesShanker M Ghimire, MD, 3 mL at 05/18/14 2115;  sodium chloride 0.9 % injection 3 mL, 3 mL, Intravenous, PRN, Maretta BeesShanker M Ghimire, MD vancomycin (VANCOCIN) 1,250 mg in sodium chloride 0.9 % 250 mL IVPB, 1,250 mg, Intravenous, Q8H, Hilario Quarryaron George Amend, RPH, 1,250 mg at  05/19/14 0539  Results for orders placed during the hospital encounter of 05/12/14 (from the past 48 hour(s))  VANCOMYCIN, TROUGH     Status: Abnormal   Collection Time    05/18/14 11:08 AM      Result Value Ref Range   Vancomycin Tr 8.8 (*) 10.0 - 20.0 ug/mL    No results found.  Review of Systems  Respiratory: Positive for cough.   Gastrointestinal: Negative for abdominal pain.  Neurological: Positive for headaches.   Blood pressure 99/62, pulse 87, temperature 98.3 F  (36.8 C), temperature source Oral, resp. rate 18, height 5\' 6"  (1.676 m), weight 143 lb 4.8 oz (65 kg), last menstrual period 04/27/2014, SpO2 96.00%. Physical Exam  Constitutional: She appears well-developed and well-nourished. No distress.  Respiratory: Effort normal.  Slightly decreased breath sounds right base  GI: Soft. She exhibits no mass. There is no tenderness.  Right mid abdominal small transverse scar  Neurological: She is alert.  Skin: Skin is warm and dry.  Psychiatric: She has a normal mood and affect. Her behavior is normal.    Assessment/Plan: Hydrocephalus with malfunctioning ventricular pleural shunt  Plan: I will assist Dr. Lovell Sheehan with a laparoscopic-assisted ventriculoperitoneal shunt. The procedure and risks have been discussed with her. Risks include abdomen to bleeding, infection, shunt malfunction, wound healing problems, accidental injury to intra-abdominal organs. She seems to understand this well and agrees with the plan.   Sway Guttierrez J 05/19/2014, 8:37 AM

## 2014-05-20 ENCOUNTER — Encounter (HOSPITAL_COMMUNITY)
Admission: EM | Disposition: A | Discharge status: Home Health | Payer: Self-pay | Source: Home / Self Care | Attending: Internal Medicine

## 2014-05-20 ENCOUNTER — Encounter (HOSPITAL_COMMUNITY): Payer: Medicaid Other | Admitting: Anesthesiology

## 2014-05-20 ENCOUNTER — Encounter (HOSPITAL_COMMUNITY): Payer: Self-pay | Admitting: Certified Registered"

## 2014-05-20 ENCOUNTER — Inpatient Hospital Stay (HOSPITAL_COMMUNITY): Payer: Medicaid Other | Admitting: Anesthesiology

## 2014-05-20 DIAGNOSIS — T8509XA Other mechanical complication of ventricular intracranial (communicating) shunt, initial encounter: Secondary | ICD-10-CM | POA: Diagnosis present

## 2014-05-20 HISTORY — PX: VENTRICULOPERITONEAL SHUNT: SHX204

## 2014-05-20 HISTORY — PX: SHUNT REMOVAL: SHX342

## 2014-05-20 HISTORY — PX: LAPAROSCOPIC REVISION VENTRICULAR-PERITONEAL (V-P) SHUNT: SHX5924

## 2014-05-20 LAB — SURGICAL PCR SCREEN
MRSA, PCR: NEGATIVE
STAPHYLOCOCCUS AUREUS: NEGATIVE

## 2014-05-20 SURGERY — SHUNT REMOVAL
Anesthesia: General | Laterality: Right

## 2014-05-20 MED ORDER — LACTATED RINGERS IV SOLN
INTRAVENOUS | Status: DC | PRN
  Administered 2014-05-20 (×2): via INTRAVENOUS

## 2014-05-20 MED ORDER — LIDOCAINE HCL (CARDIAC) 20 MG/ML IV SOLN
INTRAVENOUS | Status: DC | PRN
  Administered 2014-05-20: 100 mg via INTRAVENOUS

## 2014-05-20 MED ORDER — ACETAMINOPHEN 650 MG RE SUPP: 650.0000 mg | RECTAL | Status: DC | PRN

## 2014-05-20 MED ORDER — HEMOSTATIC AGENTS (NO CHARGE) OPTIME
TOPICAL | Status: DC | PRN
  Administered 2014-05-20: 1 via TOPICAL

## 2014-05-20 MED ORDER — ONDANSETRON HCL 4 MG PO TABS: 4.0000 mg | ORAL_TABLET | ORAL | Status: DC | PRN

## 2014-05-20 MED ORDER — ACETAMINOPHEN 325 MG PO TABS
650.0000 mg | ORAL_TABLET | ORAL | Status: DC | PRN
  Filled 2014-05-20: qty 2

## 2014-05-20 MED ORDER — NEOSTIGMINE METHYLSULFATE 10 MG/10ML IV SOLN
INTRAVENOUS | Status: DC | PRN
  Administered 2014-05-20: 4 mg via INTRAVENOUS

## 2014-05-20 MED ORDER — FENTANYL CITRATE 0.05 MG/ML IJ SOLN
INTRAMUSCULAR | Status: DC | PRN
  Administered 2014-05-20: 50 ug via INTRAVENOUS
  Administered 2014-05-20 (×3): 100 ug via INTRAVENOUS

## 2014-05-20 MED ORDER — GLYCOPYRROLATE 0.2 MG/ML IJ SOLN
INTRAMUSCULAR | Status: DC | PRN
  Administered 2014-05-20: 0.6 mg via INTRAVENOUS

## 2014-05-20 MED ORDER — VANCOMYCIN HCL 10 G IV SOLR
1250.0000 mg | Freq: Three times a day (TID) | INTRAVENOUS | Status: DC
  Administered 2014-05-20 – 2014-05-22 (×6): 1250 mg via INTRAVENOUS
  Filled 2014-05-20 (×8): qty 1250

## 2014-05-20 MED ORDER — PROMETHAZINE HCL 25 MG/ML IJ SOLN: 6.2500 mg | INTRAMUSCULAR | Status: DC | PRN

## 2014-05-20 MED ORDER — LIDOCAINE HCL (CARDIAC) 20 MG/ML IV SOLN
INTRAVENOUS | Status: AC
  Filled 2014-05-20: qty 5

## 2014-05-20 MED ORDER — CEFAZOLIN SODIUM-DEXTROSE 2-3 GM-% IV SOLR
INTRAVENOUS | Status: DC | PRN
  Administered 2014-05-20: 2 g via INTRAVENOUS

## 2014-05-20 MED ORDER — MORPHINE SULFATE 2 MG/ML IJ SOLN: 1.0000 mg | INTRAMUSCULAR | Status: DC | PRN

## 2014-05-20 MED ORDER — HYDROMORPHONE HCL PF 1 MG/ML IJ SOLN
0.2500 mg | INTRAMUSCULAR | Status: DC | PRN
  Administered 2014-05-20 (×4): 0.25 mg via INTRAVENOUS

## 2014-05-20 MED ORDER — ROCURONIUM BROMIDE 100 MG/10ML IV SOLN
INTRAVENOUS | Status: DC | PRN
  Administered 2014-05-20: 50 mg via INTRAVENOUS

## 2014-05-20 MED ORDER — DOCUSATE SODIUM 100 MG PO CAPS
100.0000 mg | ORAL_CAPSULE | Freq: Two times a day (BID) | ORAL | Status: DC
  Administered 2014-05-20 – 2014-05-21 (×3): 100 mg via ORAL
  Filled 2014-05-20 (×5): qty 1

## 2014-05-20 MED ORDER — ONDANSETRON HCL 4 MG/2ML IJ SOLN
INTRAMUSCULAR | Status: AC
  Filled 2014-05-20: qty 2

## 2014-05-20 MED ORDER — HYDROMORPHONE HCL PF 1 MG/ML IJ SOLN
INTRAMUSCULAR | Status: AC
  Filled 2014-05-20: qty 1

## 2014-05-20 MED ORDER — 0.9 % SODIUM CHLORIDE (POUR BTL) OPTIME
TOPICAL | Status: DC | PRN
  Administered 2014-05-20: 1000 mL

## 2014-05-20 MED ORDER — NEOSTIGMINE METHYLSULFATE 10 MG/10ML IV SOLN
INTRAVENOUS | Status: AC
  Filled 2014-05-20: qty 1

## 2014-05-20 MED ORDER — POTASSIUM CHLORIDE IN NACL 20-0.9 MEQ/L-% IV SOLN
INTRAVENOUS | Status: DC
  Administered 2014-05-20: 13:00:00 via INTRAVENOUS
  Filled 2014-05-20 (×4): qty 1000

## 2014-05-20 MED ORDER — THROMBIN 5000 UNITS EX SOLR
CUTANEOUS | Status: DC | PRN
  Administered 2014-05-20 (×2): 5000 [IU] via TOPICAL

## 2014-05-20 MED ORDER — GLYCOPYRROLATE 0.2 MG/ML IJ SOLN
INTRAMUSCULAR | Status: AC
  Filled 2014-05-20: qty 3

## 2014-05-20 MED ORDER — ONDANSETRON HCL 4 MG/2ML IJ SOLN
INTRAMUSCULAR | Status: DC | PRN
  Administered 2014-05-20: 4 mg via INTRAVENOUS

## 2014-05-20 MED ORDER — MIDAZOLAM HCL 2 MG/2ML IJ SOLN
INTRAMUSCULAR | Status: AC
  Filled 2014-05-20: qty 2

## 2014-05-20 MED ORDER — ROCURONIUM BROMIDE 50 MG/5ML IV SOLN
INTRAVENOUS | Status: AC
  Filled 2014-05-20: qty 1

## 2014-05-20 MED ORDER — MIDAZOLAM HCL 5 MG/5ML IJ SOLN
INTRAMUSCULAR | Status: DC | PRN
  Administered 2014-05-20: 2 mg via INTRAVENOUS

## 2014-05-20 MED ORDER — CEFAZOLIN SODIUM-DEXTROSE 2-3 GM-% IV SOLR
2.0000 g | Freq: Three times a day (TID) | INTRAVENOUS | Status: AC
  Administered 2014-05-20 (×2): 2 g via INTRAVENOUS
  Filled 2014-05-20 (×3): qty 50

## 2014-05-20 MED ORDER — PROPOFOL 10 MG/ML IV BOLUS
INTRAVENOUS | Status: AC
  Filled 2014-05-20: qty 20

## 2014-05-20 MED ORDER — OXYCODONE HCL 5 MG PO TABS
5.0000 mg | ORAL_TABLET | Freq: Once | ORAL | Status: AC | PRN
  Administered 2014-05-20: 5 mg via ORAL

## 2014-05-20 MED ORDER — PROMETHAZINE HCL 25 MG PO TABS: 12.5000 mg | ORAL_TABLET | ORAL | Status: DC | PRN

## 2014-05-20 MED ORDER — LABETALOL HCL 5 MG/ML IV SOLN: 10.0000 mg | INTRAVENOUS | Status: DC | PRN

## 2014-05-20 MED ORDER — PROPOFOL 10 MG/ML IV BOLUS
INTRAVENOUS | Status: DC | PRN
  Administered 2014-05-20: 150 mg via INTRAVENOUS

## 2014-05-20 MED ORDER — DEXAMETHASONE SODIUM PHOSPHATE 4 MG/ML IJ SOLN
INTRAMUSCULAR | Status: DC | PRN
  Administered 2014-05-20: 8 mg via INTRAVENOUS

## 2014-05-20 MED ORDER — PANTOPRAZOLE SODIUM 40 MG IV SOLR
40.0000 mg | Freq: Every day | INTRAVENOUS | Status: DC
  Administered 2014-05-20: 40 mg via INTRAVENOUS
  Filled 2014-05-20 (×2): qty 40

## 2014-05-20 MED ORDER — HYDROCODONE-ACETAMINOPHEN 5-325 MG PO TABS
1.0000 | ORAL_TABLET | ORAL | Status: DC | PRN
  Administered 2014-05-20 – 2014-05-21 (×3): 1 via ORAL
  Filled 2014-05-20 (×3): qty 1

## 2014-05-20 MED ORDER — BACITRACIN 50000 UNITS IM SOLR
INTRAMUSCULAR | Status: DC | PRN
  Administered 2014-05-20: 09:00:00

## 2014-05-20 MED ORDER — OXYCODONE HCL 5 MG PO TABS
ORAL_TABLET | ORAL | Status: AC
  Filled 2014-05-20: qty 1

## 2014-05-20 MED ORDER — VANCOMYCIN HCL 10 G IV SOLR
1250.0000 mg | Freq: Three times a day (TID) | INTRAVENOUS | Status: DC
  Administered 2014-05-20: 1250 mg via INTRAVENOUS
  Filled 2014-05-20 (×2): qty 1250

## 2014-05-20 MED ORDER — OXYCODONE HCL 5 MG/5ML PO SOLN: 5.0000 mg | Freq: Once | ORAL | Status: AC | PRN

## 2014-05-20 MED ORDER — ONDANSETRON HCL 4 MG/2ML IJ SOLN
4.0000 mg | INTRAMUSCULAR | Status: DC | PRN
  Administered 2014-05-20: 4 mg via INTRAVENOUS
  Filled 2014-05-20: qty 2

## 2014-05-20 MED ORDER — LIDOCAINE-EPINEPHRINE 1 %-1:100000 IJ SOLN
INTRAMUSCULAR | Status: DC | PRN
  Administered 2014-05-20: 4 mL

## 2014-05-20 MED ORDER — FENTANYL CITRATE 0.05 MG/ML IJ SOLN
INTRAMUSCULAR | Status: AC
  Filled 2014-05-20: qty 5

## 2014-05-20 MED ORDER — BUPIVACAINE-EPINEPHRINE (PF) 0.25% -1:200000 IJ SOLN
INTRAMUSCULAR | Status: AC
  Filled 2014-05-20: qty 30

## 2014-05-20 SURGICAL SUPPLY — 78 items
APL SKNCLS STERI-STRIP NONHPOA (GAUZE/BANDAGES/DRESSINGS) ×4
BAG DECANTER FOR FLEXI CONT (MISCELLANEOUS) ×4 IMPLANT
BENZOIN TINCTURE PRP APPL 2/3 (GAUZE/BANDAGES/DRESSINGS) ×6 IMPLANT
BLADE SURG ROTATE 9660 (MISCELLANEOUS) ×8 IMPLANT
BOOT SUTURE AID YELLOW STND (SUTURE) ×4 IMPLANT
BRUSH SCRUB EZ 1% IODOPHOR (MISCELLANEOUS) ×4 IMPLANT
BUR ACORN 6.0 PRECISION (BURR) ×3 IMPLANT
BUR ACORN 6.0MM PRECISION (BURR) ×1
CANISTER SUCT 3000ML (MISCELLANEOUS) ×4 IMPLANT
CLIP RANEY DISP (INSTRUMENTS) ×2 IMPLANT
CLOSURE WOUND 1/2 X4 (GAUZE/BANDAGES/DRESSINGS) ×2
CONT SPEC 4OZ CLIKSEAL STRL BL (MISCELLANEOUS) IMPLANT
DECANTER SPIKE VIAL GLASS SM (MISCELLANEOUS) ×4 IMPLANT
DRAPE INCISE IOBAN 85X60 (DRAPES) ×4 IMPLANT
DRAPE ORTHO SPLIT 77X108 STRL (DRAPES) ×8
DRAPE POUCH INSTRU U-SHP 10X18 (DRAPES) ×4 IMPLANT
DRAPE SURG 17X23 STRL (DRAPES) ×24 IMPLANT
DRAPE SURG ORHT 6 SPLT 77X108 (DRAPES) ×4 IMPLANT
DRSG TEGADERM 4X4.75 (GAUZE/BANDAGES/DRESSINGS) ×2 IMPLANT
DRSG TELFA 3X8 NADH (GAUZE/BANDAGES/DRESSINGS) IMPLANT
ELECT REM PT RETURN 9FT ADLT (ELECTROSURGICAL) ×4
ELECTRODE REM PT RTRN 9FT ADLT (ELECTROSURGICAL) ×2 IMPLANT
GAUZE SPONGE 2X2 8PLY STRL LF (GAUZE/BANDAGES/DRESSINGS) IMPLANT
GAUZE SPONGE 4X4 16PLY XRAY LF (GAUZE/BANDAGES/DRESSINGS) IMPLANT
GLOVE BIO SURGEON STRL SZ7.5 (GLOVE) ×2 IMPLANT
GLOVE BIO SURGEON STRL SZ8.5 (GLOVE) ×4 IMPLANT
GLOVE BIOGEL PI IND STRL 7.5 (GLOVE) IMPLANT
GLOVE BIOGEL PI IND STRL 8 (GLOVE) ×2 IMPLANT
GLOVE BIOGEL PI INDICATOR 7.5 (GLOVE) ×2
GLOVE BIOGEL PI INDICATOR 8 (GLOVE) ×4
GLOVE ECLIPSE 8.0 STRL XLNG CF (GLOVE) ×6 IMPLANT
GLOVE EXAM NITRILE LRG STRL (GLOVE) IMPLANT
GLOVE EXAM NITRILE MD LF STRL (GLOVE) IMPLANT
GLOVE EXAM NITRILE XL STR (GLOVE) IMPLANT
GLOVE EXAM NITRILE XS STR PU (GLOVE) IMPLANT
GLOVE SS BIOGEL STRL SZ 8 (GLOVE) ×2 IMPLANT
GLOVE SUPERSENSE BIOGEL SZ 8 (GLOVE) ×2
GOWN STRL NON-REIN LRG LVL3 (GOWN DISPOSABLE) ×8 IMPLANT
GOWN STRL REUS W/ TWL LRG LVL3 (GOWN DISPOSABLE) IMPLANT
GOWN STRL REUS W/ TWL XL LVL3 (GOWN DISPOSABLE) IMPLANT
GOWN STRL REUS W/TWL LRG LVL3 (GOWN DISPOSABLE)
GOWN STRL REUS W/TWL XL LVL3 (GOWN DISPOSABLE)
KIT BASIN OR (CUSTOM PROCEDURE TRAY) ×4 IMPLANT
KIT ROOM TURNOVER OR (KITS) ×4 IMPLANT
NEEDLE HYPO 22GX1.5 SAFETY (NEEDLE) ×4 IMPLANT
NS IRRIG 1000ML POUR BTL (IV SOLUTION) ×4 IMPLANT
PACK LAMINECTOMY NEURO (CUSTOM PROCEDURE TRAY) ×4 IMPLANT
PAD ARMBOARD 7.5X6 YLW CONV (MISCELLANEOUS) ×12 IMPLANT
PAD DRESSING TELFA 3X8 NADH (GAUZE/BANDAGES/DRESSINGS) IMPLANT
SHEATH COOK PEEL AWAY SET 9F (SHEATH) ×4 IMPLANT
SHEATH PERITONEAL INTRO 46 (MISCELLANEOUS) IMPLANT
SHEATH PERITONEAL INTRO 61 (MISCELLANEOUS) IMPLANT
SLEEVE ENDOPATH XCEL 5M (ENDOMECHANICALS) ×6 IMPLANT
SPONGE GAUZE 2X2 STER 10/PKG (GAUZE/BANDAGES/DRESSINGS)
SPONGE LAP 4X18 X RAY DECT (DISPOSABLE) IMPLANT
STAPLER SKIN PROX WIDE 3.9 (STAPLE) ×4 IMPLANT
STRIP CLOSURE SKIN 1/2X4 (GAUZE/BANDAGES/DRESSINGS) ×6 IMPLANT
SUT ETHILON 3 0 PS 1 (SUTURE) IMPLANT
SUT MON AB 4-0 PC3 18 (SUTURE) ×6 IMPLANT
SUT NURALON 4 0 TR CR/8 (SUTURE) IMPLANT
SUT SILK 0 TIES 10X30 (SUTURE) ×2 IMPLANT
SUT SILK 3 0 SH 30 (SUTURE) ×4 IMPLANT
SUT VIC AB 0 CT1 18XCR BRD8 (SUTURE) IMPLANT
SUT VIC AB 0 CT1 8-18 (SUTURE) ×4
SUT VIC AB 1 CT1 18XBRD ANBCTR (SUTURE) IMPLANT
SUT VIC AB 1 CT1 8-18 (SUTURE)
SUT VIC AB 2-0 CP2 18 (SUTURE) ×4 IMPLANT
SUT VIC AB 3-0 SH 8-18 (SUTURE) ×4 IMPLANT
SYR CONTROL 10ML LL (SYRINGE) ×4 IMPLANT
TOWEL OR 17X24 6PK STRL BLUE (TOWEL DISPOSABLE) ×6 IMPLANT
TOWEL OR 17X26 10 PK STRL BLUE (TOWEL DISPOSABLE) ×4 IMPLANT
TRAY FOLEY CATH 14FRSI W/METER (CATHETERS) IMPLANT
TRAY LAPAROSCOPIC (CUSTOM PROCEDURE TRAY) ×2 IMPLANT
TROCAR XCEL BLUNT TIP 100MML (ENDOMECHANICALS) IMPLANT
TROCAR XCEL NON-BLD 5MMX100MML (ENDOMECHANICALS) ×6 IMPLANT
UNDERPAD 30X30 INCONTINENT (UNDERPADS AND DIAPERS) ×4 IMPLANT
VALVE RT ANGLE UNITIZE DIST (Valve) ×2 IMPLANT
WATER STERILE IRR 1000ML POUR (IV SOLUTION) ×4 IMPLANT

## 2014-05-20 NOTE — Transfer of Care (Signed)
Immediate Anesthesia Transfer of Care Note  Patient: [name]  Procedure(s) Performed: Procedure(s): shunt removal and placement of laproscopic VP shunt with Dr Abbey Chattersosenbower (N/A) SHUNT INSERTION VENTRICULAR-PERITONEAL (Right) LAPAROSCOPIC REVISION VENTRICULAR-PERITONEAL (V-P) SHUNT (N/A)  Patient Location: PACU  Anesthesia Type:General  Level of Consciousness: awake and alert   Airway & Oxygen Therapy: Patient Spontanous Breathing and Patient connected to nasal cannula oxygen  Post-op Assessment: Report given to PACU RN and Post -op Vital signs reviewed and stable  Post vital signs: Reviewed and stable  Complications: No apparent anesthesia complications

## 2014-05-20 NOTE — Progress Notes (Signed)
Patient ID: [name], female   DOB: 04/17/1976, 38 y.o.   MRN: [id number] Subjective:  The patient is alert and pleasant. She complains of a corneal abrasion.  Objective: Vital signs in last 24 hours: Temp:  [97.6 F (36.4 C)-98.5 F (36.9 C)] 97.6 F (36.4 C) (08/13 1600) Pulse Rate:  [56-88] 70 (08/13 1700) Resp:  [0-20] 18 (08/13 1700) BP: (86-110)/(46-68) 86/46 mmHg (08/13 1700) SpO2:  [89 %-96 %] 95 % (08/13 1700) Weight:  [68.7 kg (151 lb 7.3 oz)] 68.7 kg (151 lb 7.3 oz) (08/13 1149)  Intake/Output from previous day: 08/12 0701 - 08/13 0700 In: 530 [P.O.:480; IV Piggyback:50] Out: -  Intake/Output this shift: Total I/O In: 1861.3 [P.O.:240; I.V.:1271.3; IV Piggyback:350] Out: 25 [Blood:25]  Physical exam the patient is alert and oriented x3. Her strength is normal. Her dressings are clean and dry.  Lab Results: No results found for this basename: WBC, HGB, HCT, PLT,  in the last 72 hours BMET No results found for this basename: NA, K, CL, CO2, GLUCOSE, BUN, CREATININE, CALCIUM,  in the last 72 hours  Studies/Results: No results found.  Assessment/Plan: The patient is doing well except for the corneal abrasion. This will resolve with time. I have answered all the patient's, and her husband's, questions.  LOS: 8 days     [name] 05/20/2014, 5:35 PM

## 2014-05-20 NOTE — Op Note (Signed)
Brief history: The patient is a 38 year old [place] who has had a shunt placed back in 2001. She had an infection and has had multiple revisions. The last revision was done by me in February 2015. She now has a right-sided ventriculopleural shunt. She has had a persistent cough and an enlarging pleural effusion. Patient has had a thoracocentesis and a shunt tap needed for which demonstrated a clear signs of infection. I discussed the situation with the patient and her husband. I have recommended that we replace her shunt with a ventriculoperitoneal shunt. I described the surgery to them. The patient has weighed the risks, benefits, and alternatives surgery and decided proceed with the operation.  Preop diagnosis: Hydrocephalus, ventriculopleural shunt malfunction, pleural effusion  Postop diagnosis: The same  Procedure: Placement of a right retroauricular ventriculoperitoneal shunt (Codman programmable valve initially set at 100 mmHg with a 9 cm ventricular catheter) with laparoscopic assistance, removal of ventriculopleural shunt  Surgeon: Dr. Delma OfficerJeff Parvin Stetzer and Dr. Larey Brickodd Rosenbauer  Anesthesia: Gen. endotracheal  Estimated blood loss: Minimal  Specimens: None  Drains: None  Complications: None  Description of procedure: The patient was brought to the operating room by the anesthesia team. General endotracheal anesthesia was induced. The patient remained in the supine position. A roll was placed under her right thorax. The patient's right Trover, region was shaved with clippers. This region, her neck, thorax and abdomen were prepared with Betadine scrub, Betadine solution, and ChloraPrep. Sterile drapes were applied.  I then made an incision over the patient's surgical scar in her right retroauricular region. I used Raney clips for wound edge hemostasis. I used electrocautery to expose the underlying shunt. We used a cerebellar retractor for exposure. I then pulled back is much  of the distal catheter as possible until it snapped. I don't think I got all the distal catheter. I then removed the shunt valve and the ventricular catheter. I inserted a new 9 cm ventricular catheter into the ventricle. There was good flow of cerebral spinal fluid under pressure. I connected this to the new Codman programmable valve which had been set to 100 mm of mercury. I secured the connection with a silk tie. There was good spontaneous flow of cerebral spinal fluid through the distal end of the peritoneal catheter. I used a shunt passer to pass the shunt from the retroauricular sensation to the right upper quadrant incision that Dr. Purnell Shoemakerosenbauer had created. Dr. Purnell Shoemakerosenbauer then inserted the perineal catheter into the peritoneum under laparoscopic guidance. I secured the shunt valve in place with a figure-of-eight 0 Vicryl suture. I then removed the cerebellar retractor. I then reapproximated patient's galea with interrupted 2-0 Vicryl suture. I reapproximated the skin with stainless steel staples. Dr. Purnell Shoemakerosenbauer closed the abdominal incisions. Sterile dressings were applied to the incisions. The drapes were removed. The patient was subsequently extubated by the anesthesia team and transported to the post anesthesia care unit in stable condition. By report all sponge instrument and needle counts were correct at the end this case.

## 2014-05-20 NOTE — Anesthesia Postprocedure Evaluation (Signed)
  Anesthesia Post-op Note  Patient: [name]  Procedure(s) Performed: Procedure(s): shunt removal and placement of laproscopic VP shunt with Dr Abbey Chattersosenbower (N/A) SHUNT INSERTION VENTRICULAR-PERITONEAL (Right) LAPAROSCOPIC REVISION VENTRICULAR-PERITONEAL (V-P) SHUNT (N/A)  Patient Location: PACU  Anesthesia Type:General  Level of Consciousness: awake and alert   Airway and Oxygen Therapy: Patient Spontanous Breathing  Post-op Pain: mild  Post-op Assessment: Post-op Vital signs reviewed  Post-op Vital Signs: stable  Last Vitals:  Filed Vitals:   05/20/14 1005  BP: 103/66  Pulse: 83  Temp:   Resp: 16    Complications: No apparent anesthesia complications

## 2014-05-20 NOTE — Progress Notes (Signed)
Pt complaining of right eye pain and saying it feels like "something is in it." Per pt's request, ice pack applied offering little relief.  Dr. Lovell SheehanJenkins notified and he thought it was a corneal abrasion.  No orders given.

## 2014-05-20 NOTE — H&P (Signed)
  Subjective:  The patient is alert and pleasant.  Objective: Vital signs in last 24 hours: Temp:  [98 F (36.7 C)-98.5 F (36.9 C)] 98.5 F (36.9 C) (08/13 0551) Pulse Rate:  [87-92] 87 (08/13 0551) Resp:  [18] 18 (08/13 0551) BP: (93-112)/(60-68) 93/60 mmHg (08/13 0551) SpO2:  [96 %-98 %] 96 % (08/13 0551)  Intake/Output from previous day: 08/12 0701 - 08/13 0700 In: 530 [P.O.:480; IV Piggyback:50] Out: -  Intake/Output this shift:    Physical exam the patient is alert and oriented x3. She is moving all 4 extremities well. His speech is normal.  Lab Results: No results found for this basename: WBC, HGB, HCT, PLT,  in the last 72 hours BMET No results found for this basename: NA, K, CL, CO2, GLUCOSE, BUN, CREATININE, CALCIUM,  in the last 72 hours  Studies/Results: No results found.  Assessment/Plan: Ventriculopleural shunt malfunction, pleural effusion: I have again discussed situation with the patient. I have answered all her questions regarding surgery. She wants to proceed with a revision/replacement of her for ventriculopleural shunt tube preferably a ventriculoperitoneal shunt but possibly a left-sided ventriculopleural shunt.  LOS: 8 days     Deddrick Saindon D 05/20/2014, 7:12 AM

## 2014-05-20 NOTE — Anesthesia Preprocedure Evaluation (Addendum)
Anesthesia Evaluation  Patient identified by MRN, date of birth, ID band Patient awake    Reviewed: Allergy & Precautions, H&P , NPO status , Patient's Chart, lab work & pertinent test results, reviewed documented beta blocker date and time   Airway Mallampati: II TM Distance: >3 FB Neck ROM: full    Dental  (+) Dental Advisory Given, Partial Upper, Partial Lower   Pulmonary neg pulmonary ROS,  breath sounds clear to auscultation        Cardiovascular Exercise Tolerance: Good negative cardio ROS  Rhythm:regular Rate:Normal     Neuro/Psych  Headaches, negative psych ROS   GI/Hepatic negative GI ROS, Neg liver ROS,   Endo/Other  negative endocrine ROS  Renal/GU      Musculoskeletal   Abdominal   Peds  Hematology negative hematology ROS (+)   Anesthesia Other Findings See surgeon's H&P   Reproductive/Obstetrics negative OB ROS                          Anesthesia Physical Anesthesia Plan  ASA: II  Anesthesia Plan: General ETT   Post-op Pain Management:    Induction: Intravenous  Airway Management Planned: Oral ETT  Additional Equipment:   Intra-op Plan:   Post-operative Plan: Extubation in OR  Informed Consent: I have reviewed the patients History and Physical, chart, labs and discussed the procedure including the risks, benefits and alternatives for the proposed anesthesia with the patient or authorized representative who has indicated his/her understanding and acceptance.     Plan Discussed with: CRNA and Surgeon  Anesthesia Plan Comments:        Anesthesia Quick Evaluation

## 2014-05-20 NOTE — Progress Notes (Signed)
PATIENT DETAILS Name: [name] Age: 38 y.o. 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 The patient underwent a revision performed by Dr. Tressie Stalker on 12/03/13. Since that period of time, the patient has complained of headache with intermittent nausea with nonproductive cough. She presented to the emergency department on 05/12/2014 with progressive headache, worsening cough and shortness of breath. Chest x-ray in the emergency department revealed her worsening right-sided pleural effusion. CT of the brain did not show any hydrocephalus. Pulmonology was consulted. The patient underwent initial thoracocentesis on 05/12/2014. Although the cultures from the thoracocentesis were negative, the fluid studies were consistent with an exudate. After the thoracocentesis, the patient's breathing and headache improved. Dr. Lovell Sheehan (neurosurgery) saw the patient and a shunt tap was performed on 05/14/2014. The CSF cultures were negative and cell count was only 1 WBC with normal protein and glucose. Infectious disease was consulted. Initially, they did not recommend starting the patient on antibiotics at this point as the patient is afebrile and clinically stable. Repeat thoracocentesis was performed on 05/15/2014. Once again, the fluid was consistent with an exudate although the cultures remained negative. Multiple studies were sent from the pleural fluid including cytology, adenosine deaminase, MTB PCR, bacterial, fungal, and AFB cultures. All consultants felt that there was some degree of VP shunt dysfunction. As a result, Dr. Lovell Sheehan plans to revise the shunt on 05/19/2014 or 05/20/2014. Because the repeat thoracocentesis showed a glucose less than 20, there was concern for a consumptive process  in the patient's pleural cavity. As a result, infectious disease started the patient on intravenous antibiotics on 05/16/14.   Subjective: No major issues  Assessment/Plan: Right Pleural Effusion with possible ventriculopleural shunt infection - Patient was admitted with concerns for ventriculopleural shunt malfunction and subsequent infection giving her clinical symptoms of low-grade fever and headache. Patient was admitted and monitored off antibiotics initially, underwent thoracocentesis 8/5 and 8/8, fluid consistent with exudative pathology, all cultures negative so far. Patient also underwent shunt tap which had a benign CSF findings.Patient was seen in consultation by pulmonary critical care, infectious disease and neurosurgery, current consensus is for possible VP shunt infection, as a result patient was empirically started on vancomycin, Rocephin and Flagyl on 8/9. Patient undeerwent shunt revision on 8/13.Will await ID eval to see if patient still needs to   Disposition: Remain inpatient  DVT Prophylaxis: SCD's  Code Status: Full code   Family Communication Husband at bedside  Procedures:  Thoraccentesis 8/5 and 8/9  VP shunt tap 8/7  CONSULTS:  pulmonary/intensive care, neurology and Neurosurgery   MEDICATIONS: Scheduled Meds: .  ceFAZolin (ANCEF) IV  2 g Intravenous Q8H  . cefTRIAXone (ROCEPHIN)  IV  2 g Intravenous Q12H  . docusate sodium  100 mg Oral BID  . HYDROmorphone      . metroNIDAZOLE  500 mg Oral 3 times per day  . oxyCODONE      . pantoprazole (PROTONIX) IV  40 mg Intravenous QHS  . vancomycin  1,250 mg Intravenous Q8H   Continuous Infusions: . 0.9 % NaCl with KCl 20 mEq / L 75 mL/hr at 05/20/14 1300   PRN Meds:.acetaminophen, acetaminophen, HYDROcodone-acetaminophen, labetalol, morphine injection, ondansetron (ZOFRAN) IV, ondansetron, promethazine  Antibiotics: Anti-infectives   Start     Dose/Rate Route Frequency Ordered  Stop   05/20/14 1500   vancomycin (VANCOCIN) 1,250 mg in sodium chloride 0.9 % 250 mL IVPB     1,250 mg 166.7 mL/hr over 90 Minutes Intravenous Every 8 hours 05/20/14 1315     05/20/14 1230  vancomycin (VANCOCIN) 1,250 mg in sodium chloride 0.9 % 250 mL IVPB  Status:  Discontinued     1,250 mg 166.7 mL/hr over 90 Minutes Intravenous Every 8 hours 05/20/14 1224 05/20/14 1315   05/20/14 1200  ceFAZolin (ANCEF) IVPB 2 g/50 mL premix     2 g 100 mL/hr over 30 Minutes Intravenous Every 8 hours 05/20/14 1148 05/21/14 0359   05/20/14 0837  bacitracin 50,000 Units in sodium chloride irrigation 0.9 % 500 mL irrigation  Status:  Discontinued       As needed 05/20/14 0837 05/20/14 0917   05/20/14 0600  ceFAZolin (ANCEF) IVPB 2 g/50 mL premix  Status:  Discontinued     2 g 100 mL/hr over 30 Minutes Intravenous On call to O.R. 05/19/14 1439 05/20/14 1148   05/18/14 1300  vancomycin (VANCOCIN) 1,250 mg in sodium chloride 0.9 % 250 mL IVPB  Status:  Discontinued     1,250 mg 166.7 mL/hr over 90 Minutes Intravenous Every 8 hours 05/18/14 1210 05/20/14 1148   05/16/14 2000  vancomycin (VANCOCIN) IVPB 750 mg/150 ml premix  Status:  Discontinued     750 mg 150 mL/hr over 60 Minutes Intravenous Every 8 hours 05/16/14 1754 05/18/14 1210   05/16/14 1830  cefTRIAXone (ROCEPHIN) 2 g in dextrose 5 % 50 mL IVPB     2 g 100 mL/hr over 30 Minutes Intravenous Every 12 hours 05/16/14 1721     05/16/14 1730  metroNIDAZOLE (FLAGYL) tablet 500 mg     500 mg Oral 3 times per day 05/16/14 1721         PHYSICAL EXAM: Vital signs in last 24 hours: Filed Vitals:   05/20/14 1130 05/20/14 1149 05/20/14 1200 05/20/14 1300  BP:  105/56 99/57 105/53  Pulse: 76 73 65 56  Temp: 97.6 F (36.4 C) 98.3 F (36.8 C)    TempSrc:  Oral    Resp: 19 16 15  0  Height:  5\' 3"  (1.6 m)    Weight:  68.7 kg (151 lb 7.3 oz)    SpO2: 94% 94% 95% 89%    Weight change:  Filed Weights   05/12/14 2052 05/20/14 1149  Weight: 65 kg (143 lb 4.8 oz) 68.7 kg  (151 lb 7.3 oz)   Body mass index is 26.84 kg/(m^2).   Gen Exam: Awake and alert with clear speech.   Neck: Supple, No JVD.   Chest: B/L Clear.   CVS: S1 S2 Regular, no murmurs.  Abdomen: soft, BS +, non tender, non distended.  Extremities: no edema, lower extremities warm to touch. Neurologic: Non Focal.   Skin: No Rash.   Wounds: N/A.   Intake/Output from previous day:  Intake/Output Summary (Last 24 hours) at 05/20/14 1419 Last data filed at 05/20/14 1308  Gross per 24 hour  Intake 1171.25 ml  Output     25 ml  Net 1146.25 ml     LAB RESULTS: CBC  Recent Labs Lab 05/14/14 0529 05/16/14 0526  WBC 10.6* 7.7  HGB 13.1 12.5  HCT 40.3 38.6  PLT 344 354  MCV 85.4 83.4  MCH 27.8 27.0  MCHC 32.5 32.4  RDW 13.4 13.5    Chemistries   Recent Labs Lab 05/16/14 0526  NA 141  K 4.0  CL 104  CO2 26  GLUCOSE 99  BUN 11  CREATININE 0.69  CALCIUM 8.8    CBG: No results found for this basename: GLUCAP,  in the last 168 hours  GFR Estimated Creatinine Clearance: 86.9 ml/min (by C-G formula based on Cr of 0.69).  Coagulation profile No results found for this basename: INR, PROTIME,  in the last 168 hours  Cardiac Enzymes No results found for this basename: CK, CKMB, TROPONINI, MYOGLOBIN,  in the last 168 hours  No components found with this basename: POCBNP,  No results found for this basename: DDIMER,  in the last 72 hours No results found for this basename: HGBA1C,  in the last 72 hours No results found for this basename: CHOL, HDL, LDLCALC, TRIG, CHOLHDL, LDLDIRECT,  in the last 72 hours No results found for this basename: TSH, T4TOTAL, FREET3, T3FREE, THYROIDAB,  in the last 72 hours No results found for this basename: VITAMINB12, FOLATE, FERRITIN, TIBC, IRON, RETICCTPCT,  in the last 72 hours No results found for this basename: LIPASE, AMYLASE,  in the last 72 hours  Urine Studies No results found for this basename: UACOL, UAPR, USPG, UPH, UTP, UGL,  UKET, UBIL, UHGB, UNIT, UROB, ULEU, UEPI, UWBC, URBC, UBAC, CAST, CRYS, UCOM, BILUA,  in the last 72 hours  MICROBIOLOGY: Recent Results (from the past 240 hour(s))  BODY FLUID CULTURE     Status: None   Collection Time    05/12/14  7:50 PM      Result Value Ref Range Status   Specimen Description PLEURAL FLUID RIGHT   Final   Special Requests NONE   Final   Gram Stain     Final   Value: RARE WBC PRESENT,BOTH PMN AND MONONUCLEAR     NO ORGANISMS SEEN     Performed at [facility]   Culture     Final   Value: NO GROWTH 3 DAYS     Performed at [facility]   Report Status 05/16/2014 FINAL   Final  AFB CULTURE WITH SMEAR     Status: None   Collection Time    05/12/14  7:50 PM      Result Value Ref Range Status   Specimen Description PLEURAL FLUID RIGHT   Final   Special Requests NONE   Final   Acid Fast Smear     Final   Value: NO ACID FAST BACILLI SEEN     Performed at [facility]   Culture     Final   Value: CULTURE WILL BE EXAMINED FOR 6 WEEKS BEFORE ISSUING A FINAL REPORT     Performed at [facility]   Report Status PENDING   Incomplete  FUNGUS CULTURE W SMEAR     Status: None   Collection Time    05/12/14  7:50 PM      Result Value Ref Range Status   Specimen Description PLEURAL FLUID RIGHT   Final   Special Requests NONE   Final   Fungal Smear     Final   Value: NO YEAST OR FUNGAL ELEMENTS SEEN     Performed at [facility]   Culture     Final   Value: CULTURE IN PROGRESS FOR FOUR WEEKS     Performed at [facility]   Report Status PENDING   Incomplete  CULTURE, BLOOD (ROUTINE X 2)     Status: None   Collection Time    05/12/14  8:15 PM      Result Value Ref Range Status   Specimen Description BLOOD RIGHT ARM   Final   Special Requests BOTTLES DRAWN AEROBIC AND ANAEROBIC [facility]   Final   Culture  Setup Time     Final   Value: 05/13/2014 00:24     Performed at [facility]   Culture     Final   Value: NO  GROWTH 5 DAYS     Performed at [facility]   Report Status 05/19/2014 FINAL   Final  CULTURE, BLOOD (ROUTINE X 2)     Status: None   Collection Time    05/12/14  8:20 PM      Result Value Ref Range Status   Specimen Description BLOOD RIGHT HAND   Final   Special Requests BOTTLES DRAWN AEROBIC ONLY 10CC   Final   Culture  Setup Time     Final   Value: 05/13/2014 00:23     Performed at [facility]   Culture     Final   Value: NO GROWTH 5 DAYS     Performed at [facility]   Report Status 05/19/2014 FINAL   Final  CSF CULTURE     Status: None   Collection Time    05/14/14  3:04 PM      Result Value Ref Range Status   Specimen Description CSF   Final   Special Requests NONE   Final   Gram Stain     Final   Value: WBC PRESENT, PREDOMINANTLY MONONUCLEAR     NO ORGANISMS SEEN     CYTOSPIN Performed at [facility]     Performed at [facility]   Culture     Final   Value: NO GROWTH 3 DAYS     Performed at [facility]   Report Status 05/18/2014 FINAL   Final  GRAM STAIN     Status: None   Collection Time    05/14/14  3:04 PM      Result Value Ref Range Status   Specimen Description CSF   Final   Special Requests NONE   Final   Gram Stain     Final   Value: CYTOSPIN PREP     WBC PRESENT, PREDOMINANTLY MONONUCLEAR     NO ORGANISMS SEEN   Report Status 05/14/2014 FINAL   Final  AFB CULTURE WITH SMEAR     Status: None   Collection Time    05/15/14  4:10 PM      Result Value Ref Range Status   Specimen Description PLEURAL RIGHT FLUID   Final   Special Requests NONE   Final   Acid Fast Smear     Final   Value: NO ACID FAST BACILLI SEEN     Performed at [facility]   Culture     Final   Value: CULTURE WILL BE EXAMINED FOR 6 WEEKS BEFORE ISSUING A FINAL REPORT     Performed at [facility]   Report Status PENDING   Incomplete  FUNGUS CULTURE W SMEAR     Status: None   Collection Time    05/15/14  4:10 PM       Result Value Ref Range Status   Specimen Description PLEURAL RIGHT   Final   Special Requests NONE   Final   Fungal Smear     Final   Value: NO YEAST OR FUNGAL ELEMENTS SEEN  Performed at [facility]     Final   Value: CULTURE IN PROGRESS FOR FOUR WEEKS     Performed at [facility]   Report Status PENDING   Incomplete  BODY FLUID CULTURE     Status: None   Collection Time    05/15/14  4:10 PM      Result Value Ref Range Status   Specimen Description PLEURAL RIGHT FLUID   Final   Special Requests NONE   Final   Gram Stain     Final   Value: MODERATE WBC PRESENT,BOTH PMN AND MONONUCLEAR     NO ORGANISMS SEEN     Performed at [facility]   Culture     Final   Value: NO GROWTH 3 DAYS     Performed at [facility]   Report Status 05/19/2014 FINAL   Final  SURGICAL PCR SCREEN     Status: None   Collection Time    05/19/14 11:34 PM      Result Value Ref Range Status   MRSA, PCR NEGATIVE  NEGATIVE Final   Staphylococcus aureus NEGATIVE  NEGATIVE Final   Comment:            The Xpert SA Assay (FDA     approved for NASAL specimens     in patients over 54 years of age),     is one component of     a comprehensive surveillance     program.  Test performance has     been validated by [employer] for patients greater     than or equal to 17 year old.     It is not intended     to diagnose infection nor to     guide or monitor treatment.    RADIOLOGY STUDIES/RESULTS: Dg Chest 1 View  05/15/2014   CLINICAL DATA:  Post right thoracentesis  EXAM: CHEST - 1 VIEW  COMPARISON:  05/14/2014  FINDINGS: No pneumothorax status post right thoracentesis.  Moderate right pleural effusion. Right pleural drain. Associated right lower lobe opacity, likely compressive atelectasis.  Left lung is clear.  The heart is normal in size.  IMPRESSION: No pneumothorax status post right thoracentesis.  Moderate right pleural effusion with right pleural drain.    Electronically Signed   By: Charline Bills M.D.   On: 05/15/2014 16:36   Dg Chest 2 View  05/14/2014   CLINICAL DATA:  38 year old female with cough and pleural effusion. Ventriculoperitoneal shunt.  EXAM: CHEST  2 VIEW  COMPARISON:  05/12/2014 and prior radiographs and CTs  FINDINGS: A moderate right pleural effusion is unchanged with right lower lung atelectasis.  The left lung is clear.  The cardiomediastinal silhouette is stable.  A ventriculoperitoneal shunt is again identified overlying the right chest with tip overlying the lower right hemithorax. There is no evidence of pneumothorax.  IMPRESSION: Unchanged moderate right pleural effusion with right basilar atelectasis. Unchanged position of ventriculoperitoneal shunt.   Electronically Signed   By: Laveda Abbe M.D.   On: 05/14/2014 20:22   Dg Chest 2 View  05/12/2014   CLINICAL DATA:  Cough and congestion.  EXAM: CHEST  2 VIEW  COMPARISON:  05/11/2014  FINDINGS: Two views of this chest demonstrate a right ventricular-pleural shunt catheter. There appears to be slightly increased right pleural fluid with compressive atelectasis. The right pleural effusion is moderate-to-large in size. The left lung remains clear. Trachea is midline.  Heart size is stable.  IMPRESSION: There is a moderate-to-large sized right pleural effusion which has slightly enlarged since 05/11/2014.  Right ventricular-pleural shunt.   Electronically Signed   By: Richarda Overlie M.D.   On: 05/12/2014 17:14   Dg Chest 2 View  05/11/2014   CLINICAL DATA:  Cough and decreased breath sounds on the right  EXAM: CHEST  2 VIEW  COMPARISON:  Chest radiograph December 30, 2013 and chest CT December 30, 2013  FINDINGS: There is a persistent pleural effusion on the right which may be slightly increased. There is atelectasis and possible consolidation in the right middle and lower lobes. The left lung is clear. Heart size and pulmonary vascularity are normal. No adenopathy. There is a shunt catheter on the  right which appears coiled in the right hemi thorax.  IMPRESSION: Shunt catheter appears coiled in the right hemithorax. The sizable effusion on the right may at least in part be due to fluid collecting in the right chest from the shunt catheter. There is probable compressive atelectasis in the right middle and lower lobes. Left lung is clear.  These results will be called to the ordering clinician or representative by the Radiologist Assistant, and communication documented in the PACS or zVision Dashboard.   Electronically Signed   By: Bretta Bang M.D.   On: 05/11/2014 16:38   Dg Abd 1 View  05/12/2014   CLINICAL DATA:  Cough and congestion, fever, VP shunt  EXAM: ABDOMEN - 1 VIEW  COMPARISON:  None.  FINDINGS: The bowel gas pattern is normal. No radio-opaque calculi or other significant radiographic abnormality are seen. Mild stool volume. No VP shunt tubing is identified. IUD in place over the pelvis.  IMPRESSION: Negative.   Electronically Signed   By: Christiana Pellant M.D.   On: 05/12/2014 17:12   Ct Head Wo Contrast  05/12/2014   CLINICAL DATA:  Headache.  History of VP shunt.  EXAM: CT HEAD WITHOUT CONTRAST  TECHNIQUE: Contiguous axial images were obtained from the base of the skull through the vertex without intravenous contrast.  COMPARISON:  12/04/2013  FINDINGS: Stable position of the ventriculostomy catheter. Maximal Min ventricular diameter is stable at 4.2 cm. Stable prominent temporal horns. No extra-axial fluid collections are identified. The gray-white differentiation is maintained. No findings for hemispheric infarction an or intracranial hemorrhage. The brainstem and cerebellum are grossly normal and stable.  No significant bony findings.  IMPRESSION: Stable ventriculostomy shunt catheter.  Stable appearance and size of the ventricles. No mass effect or shift.  No acute intracranial findings.   Electronically Signed   By: Loralie Champagne M.D.   On: 05/12/2014 17:01   Ct Chest W  Contrast  05/12/2014   CLINICAL DATA:  Progressive right pleural effusion. Ventricular shunt  EXAM: CT CHEST WITH CONTRAST  TECHNIQUE: Multidetector CT imaging of the chest was performed during intravenous contrast administration.  CONTRAST:  75mL OMNIPAQUE IOHEXOL 300 MG/ML  SOLN  COMPARISON:  CT chest 12/30/2013  FINDINGS: Ventricular pleural shunt is present with the shunt tubing terminating in the pleural space on the right posteriorly, unchanged from the prior study. Moderate right pleural effusion has progressed. There is increased loculated fluid anteriorly in the base on the right. Progression of right lower lobe atelectasis.  Negative for pleural effusion on the left. No mass or adenopathy is detected.  The aortic arch is normal.  Pulmonary arteries opacified normally.  IMPRESSION: Mild increase in right pleural effusion with a loculated component anteriorly. Increase  in right lower lobe compressive atelectasis.  Left lung remains clear.   Electronically Signed   By: Marlan Palau M.D.   On: 05/12/2014 21:02   Dg Chest Port 1 View  05/12/2014   CLINICAL DATA:  Post thoracentesis  EXAM: PORTABLE CHEST - 1 VIEW  COMPARISON:  05/12/2014  FINDINGS: There is small to moderate right pleural effusion with right basilar atelectasis or infiltrate. No pulmonary edema. No pneumothorax. Right ventricular pleural catheter is stable in position.  IMPRESSION: Small to moderate right pleural effusion with right basilar atelectasis or infiltrate. No pulmonary edema. No pneumothorax.   Electronically Signed   By: Natasha Mead M.D.   On: 05/12/2014 20:01   US Thoracentesis Asp Pleural Space W/img Guide  05/15/2014   CLINICAL DATA:  Right pleural effusion. Right ventricular pleural shunt.  EXAM: ULTRASOUND GUIDED RIGHT THORACENTESIS  COMPARISON:  CT 05/12/2014  PROCEDURE: An ultrasound guided thoracentesis was thoroughly discussed with the patient and questions answered. The benefits, risks, alternatives and complications  were also discussed. The patient understands and wishes to proceed with the procedure. Written consent was obtained.  Ultrasound was performed to localize and mark an adequate pocket of fluid in the right posterior chest. The area was then prepped and draped in the normal sterile fashion. 1% Lidocaine was used for local anesthesia. Under ultrasound guidance a 19 gauge Yueh catheter was introduced. Thoracentesis was performed. The catheter was removed and a dressing applied.  Complications:  None.  FINDINGS: A total of approximately 600 mL of yellow fluid was removed. A fluid sample wassent for laboratory analysis. The right pleural fluid is complex with septations.  IMPRESSION: Successful ultrasound guided right thoracentesis yielding 600 ml of pleural fluid.   Electronically Signed   By: Richarda Overlie M.D.   On: 05/15/2014 16:30    Jeoffrey Massed, MD  [facility] Pager:[phone]  If 7PM-7AM, please contact night-coverage [email or web address] Password [email or web address] 05/20/2014, 2:19 PM   LOS: 8 days   **Disclaimer: This note may have been dictated with voice recognition software. Similar sounding words can inadvertently be transcribed and this note may contain transcription errors which may not have been corrected upon publication of note.**

## 2014-05-20 NOTE — Op Note (Signed)
05/20/2014  Operative Note:  Preoperative Diagnosis:  Hydrocephalus with malfunctioning vp shunt  Postoperative Diagnosis:  Same  Procedure:  Laparoscopic assisted ventriculoperitoneal shunt placement.  Surgeon:  Avel Peaceodd Alijah Akram M.D.  Cosurgeon:  Delma OfficerJeff Jenkins, M.D.  Anesthesia:  General  Indication:  This is a 38 year old female with hydrocephalus who has a malfunctioning ventriculopleural shunt.  She now present for the above procedure.  Procedure Detail:  She was brought to the operating room placed supine on the operating table and general anesthetic was minister.  Proper positioning was done.. Hair was clipped in the appropriate areas. The neck, chest, and abdominal wall were sterilely prepped and draped.  A small incision was made in the left subcostal area. Using a 5 mm Opti-Vu trocar and laparoscope access was gained into the peritoneal cavity. The area underneath the trocar was inspected and there was no evidence of organ injury or bleeding. Under direct vision a 5 mm trocar was placed in the left midabdomen. An area was chosen in the epigastrium.  Dr. Lovell SheehanJenkins passed the tunneler  from the neck area down to the epigastrium. A small incision was made over the tip of the tunneler.  The shunt catheter was then passed from the neck through the tunneler and pulled out onto the abdominal wall. Fluid was dripping from the tip of the catheter.  The 5 mm trocar introducer was then placed through the epigastric incision into the abdominal cavity under direct vision and the track was dilated using this. A dilator introducer sheath device was then placed into the peritoneal cavity. The dilator was removed. The shunt catheter was threaded through the peel-away introducer and it was positioned in the right gutter and pelvis. The peel-away introducer was then removed.  A 4 quadrant inspection was then performed and there was no evidence of organ injury or bleeding. The trocars were removed the  pneumoperitoneum was released.  The skin incisions were closed with 4-0 Monocryl subcuticular stitches followed by Steri-Strips and sterile dressings. She tolerated the procedure without apparent complications and was taken to recovery room   in satisfactory condition.

## 2014-05-20 NOTE — Progress Notes (Signed)
UR completed.  Amaira Safley, RN BSN MHA CCM Trauma/Neuro ICU Case Manager [phone]  

## 2014-05-20 NOTE — Addendum Note (Signed)
Addendum created 05/20/14 1344 by Armandina GemmaAngela Mar Walmer   Modules edited: Anesthesia Flowsheet

## 2014-05-20 NOTE — Anesthesia Procedure Notes (Signed)
Procedure Name: Intubation Date/Time: 05/20/2014 7:47 AM Performed by: Armandina GemmaMIRARCHI, Charity Tessier Pre-anesthesia Checklist: Patient identified, Timeout performed, Emergency Drugs available, Patient being monitored and Suction available Patient Re-evaluated:Patient Re-evaluated prior to inductionOxygen Delivery Method: Circle system utilized Preoxygenation: Pre-oxygenation with 100% oxygen Intubation Type: IV induction Ventilation: Mask ventilation without difficulty Laryngoscope Size: Miller and 2 Grade View: Grade I Tube type: Oral Tube size: 7.0 mm Number of attempts: 1 Airway Equipment and Method: Stylet Placement Confirmation: ETT inserted through vocal cords under direct vision,  positive ETCO2 and breath sounds checked- equal and bilateral Secured at: 22 cm Tube secured with: Tape Dental Injury: Teeth and Oropharynx as per pre-operative assessment  Comments: IV induction [place]- intubation AM CRNA- atraumatic teeth and mouth as preop- + ETCO2 + BS Bilat- [place]

## 2014-05-20 NOTE — Progress Notes (Signed)
Patient's bedside belongings given to husband while patient in Neuro OR.

## 2014-05-21 ENCOUNTER — Inpatient Hospital Stay (HOSPITAL_COMMUNITY): Payer: Medicaid Other

## 2014-05-21 ENCOUNTER — Encounter (HOSPITAL_COMMUNITY): Payer: Self-pay | Admitting: Neurosurgery

## 2014-05-21 LAB — BASIC METABOLIC PANEL
ANION GAP: 11 (ref 5–15)
BUN: 9 mg/dL (ref 6–23)
CALCIUM: 8.6 mg/dL (ref 8.4–10.5)
CO2: 27 mEq/L (ref 19–32)
Chloride: 103 mEq/L (ref 96–112)
Creatinine, Ser: 0.57 mg/dL (ref 0.50–1.10)
GFR calc Af Amer: >90 mL/min (ref >90)
GFR calc non Af Amer: >90 mL/min (ref >90)
Glucose, Bld: 111 mg/dL — ABNORMAL HIGH (ref 70–99)
Potassium: 4.2 mEq/L (ref 3.7–5.3)
SODIUM: 141 meq/L (ref 137–147)

## 2014-05-21 LAB — M. TUBERCULOSIS COMPLEX BY PCR: M TUBERCULOSISDIRECT: NOT DETECTED

## 2014-05-21 LAB — CBC
HCT: 38.3 % (ref 36.0–46.0)
Hemoglobin: 12.4 g/dL (ref 12.0–15.0)
MCH: 27.4 pg (ref 26.0–34.0)
MCHC: 32.4 g/dL (ref 30.0–36.0)
MCV: 84.5 fL (ref 78.0–100.0)
PLATELETS: 340 10*3/uL (ref 150–400)
RBC: 4.53 MIL/uL (ref 3.87–5.11)
RDW: 13.5 % (ref 11.5–15.5)
WBC: 16.2 10*3/uL — ABNORMAL HIGH (ref 4.0–10.5)

## 2014-05-21 LAB — VANCOMYCIN, TROUGH: VANCOMYCIN TR: 15.5 ug/mL (ref 10.0–20.0)

## 2014-05-21 MED ORDER — VANCOMYCIN HCL 10 G IV SOLR: 1250.0000 mg | Freq: Three times a day (TID) | INTRAVENOUS | Status: DC

## 2014-05-21 MED ORDER — SODIUM CHLORIDE 0.9 % IJ SOLN
10.0000 mL | INTRAMUSCULAR | Status: DC | PRN
  Administered 2014-05-22: 10 mL

## 2014-05-21 MED ORDER — SODIUM CHLORIDE 0.9 % IJ SOLN: 10.0000 mL | Freq: Two times a day (BID) | INTRAMUSCULAR | Status: DC

## 2014-05-21 NOTE — Progress Notes (Signed)
Patient ID: Jed LimerickOSANNA Ogas, female   DOB: 08/27/1976, 38 y.o.   MRN: [id number] Subjective:  The patient is alert and pleasant. She denies headache. Her cough has resolved.  Objective: Vital signs in last 24 hours: Temp:  [97.6 F (36.4 C)-98.3 F (36.8 C)] 98 F (36.7 C) (08/14 0307) Pulse Rate:  [56-83] 66 (08/14 0600) Resp:  [0-23] 23 (08/14 0600) BP: (86-111)/(46-66) 106/57 mmHg (08/14 0600) SpO2:  [88 %-100 %] 100 % (08/14 0600) Weight:  [68.7 kg (151 lb 7.3 oz)] 68.7 kg (151 lb 7.3 oz) (08/13 1149)  Intake/Output from previous day: 08/13 0701 - 08/14 0700 In: 2976.3 [P.O.:480; I.V.:1796.3; IV Piggyback:700] Out: 25 [Blood:25] Intake/Output this shift:    Physical exam the patient is alert and oriented. Her strength is normal. Her dressings are clean and dry.  Lab Results:  Recent Labs  05/21/14 0308  WBC 16.2*  HGB 12.4  HCT 38.3  PLT 340   BMET  Recent Labs  05/21/14 0308  NA 141  K 4.2  CL 103  CO2 27  GLUCOSE 111*  BUN 9  CREATININE 0.57  CALCIUM 8.6    Studies/Results: Ct Head Wo Contrast  05/21/2014   CLINICAL DATA:  Status post shunt revision.  EXAM: CT HEAD WITHOUT CONTRAST  TECHNIQUE: Contiguous axial images were obtained from the base of the skull through the vertex without intravenous contrast.  COMPARISON:  Prior CT from 05/12/2014.  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 The VP shunt catheter is again seen extending via a right parietal approach. The catheter crosses the lateral ventricles with tip located in the periventricular white matter of the left frontal lobe. No hemorrhage seen along the ventricular tract. Ventricular size is decreased as compared to the prior exam with no hydrocephalus. There remains slight asymmetric enlargement of the right lateral ventricle as compared to the left. Third ventricular dilatation is improved. Fourth ventricle is stable in size.  Remote shunt tract noted within the  periventricular white matter of the right frontal lobe. No acute intracranial hemorrhage or large vessel territory infarct. No extra-axial fluid collection. No mass or midline shift.  Calvarium is intact. Remote burr hole present within the right frontal calvarium.  Skin staples overlie the shunt reservoir in the right parietal scalp. There is a few small foci of soft tissue emphysema.  No acute abnormality seen about the orbits. Paranasal sinuses and mastoid air cells are clear.  IMPRESSION: Sequelae of interval shunt revision with tip of right parietal approach shunt catheter in the periventricular white matter of the left frontal lobe. Ventriculomegaly involving the lateral and third ventricles is improved as compared to prior study.   Electronically Signed   By: Rise MuBenjamin  McClintock M.D.   On: 05/21/2014 07:10    Assessment/Plan: Postop day 1: The patient is doing very well. She can be discharged from my point of view. I gave her her discharge instructions. She is to followup with me in about 10 days to remove the staples. Her followup head CT demonstrates her ventricles are smaller and the catheter is in good position.  Pleural effusion: I'll defer the length of antibiotic treatment to the infectious disease doctors.  LOS: 9 days     Willadeen Colantuono D 05/21/2014, 7:21 AM

## 2014-05-21 NOTE — Discharge Instructions (Signed)
Remove bandages on abdominal wall Sunday, August 16.

## 2014-05-21 NOTE — Progress Notes (Signed)
Patient arrived via wheelchair to [facility].  Patient ambulates without difficulty and was oriented to her room and [facility] policies.  Patient checked off independent and was told how to call the RN or NT directly.  Will continue to monitor.  Lance BoschAnna Clementina Mareno, RN

## 2014-05-21 NOTE — Progress Notes (Signed)
Peripherally Inserted Central Catheter/Midline Placement  The IV Nurse has discussed with the patient and/or persons authorized to consent for the patient, the purpose of this procedure and the potential benefits and risks involved with this procedure.  The benefits include less needle sticks, lab draws from the catheter and patient may be discharged home with the catheter.  Risks include, but not limited to, infection, bleeding, blood clot (thrombus formation), and puncture of an artery; nerve damage and irregular heat beat.  Alternatives to this procedure were also discussed.  PICC/Midline Placement Documentation  PICC / Midline Single Lumen 05/21/14 PICC Right Basilic 37 cm 2 cm (Active)  Indication for Insertion or Continuance of Line Home intravenous therapies (PICC only) 05/21/2014  5:46 PM  Exposed Catheter (cm) 2 cm 05/21/2014  5:46 PM  Line Status Flushed;Saline locked;Blood return noted 05/21/2014  5:46 PM  Dressing Change Due 05/28/14 05/21/2014  5:46 PM       Ethelda ChickCurrie, Donat Humble Robert 05/21/2014, 5:46 PM

## 2014-05-21 NOTE — Progress Notes (Signed)
PATIENT DETAILS Name: [name] Age: 38 y.o. 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 The patient underwent a revision performed by Dr. Tressie Stalker on 12/03/13. Since that period of time, the patient has complained of headache with intermittent nausea with nonproductive cough. She presented to the emergency department on 05/12/2014 with progressive headache, worsening cough and shortness of breath. Chest x-ray in the emergency department revealed her worsening right-sided pleural effusion. CT of the brain did not show any hydrocephalus. Pulmonology was consulted. The patient underwent initial thoracocentesis on 05/12/2014. Although the cultures from the thoracocentesis were negative, the fluid studies were consistent with an exudate. After the thoracocentesis, the patient's breathing and headache improved. Dr. Lovell Sheehan (neurosurgery) saw the patient and a shunt tap was performed on 05/14/2014. The CSF cultures were negative and cell count was only 1 WBC with normal protein and glucose. Infectious disease was consulted. Initially, they did not recommend starting the patient on antibiotics at this point as the patient is afebrile and clinically stable. Repeat thoracocentesis was performed on 05/15/2014. Once again, the fluid was consistent with an exudate although the cultures remained negative. Multiple studies were sent from the pleural fluid including cytology, adenosine deaminase, MTB PCR, bacterial, fungal, and AFB cultures. All consultants felt that there was some degree of VP shunt dysfunction. As a result, Dr. Lovell Sheehan plans to revise the shunt on 05/19/2014 or 05/20/2014. Because the repeat thoracocentesis showed a glucose less than 20, there was concern for a consumptive process  in the patient's pleural cavity. As a result, infectious disease started the patient on intravenous antibiotics on 05/16/14.   Subjective: No major issues  Assessment/Plan: Right Pleural Effusion with possible ventriculopleural shunt infection - Patient was admitted with concerns for ventriculopleural shunt malfunction and subsequent infection giving her clinical symptoms of low-grade fever and headache. Patient was admitted and monitored off antibiotics initially, underwent thoracocentesis 8/5 and 8/8, fluid consistent with exudative pathology, all cultures negative so far. Patient also underwent shunt tap which had a benign CSF findings.Patient was seen in consultation by pulmonary critical care, infectious disease and neurosurgery, current consensus is for possible VP shunt infection, as a result patient was empirically started on vancomycin, Rocephin and Flagyl on 8/9. Patient underwent shunt revision on 8/13, doing well post operative, ID recommending to stop Rocephin and Flagyl, and to continue with IV Vanco for one more week. Will place PICC line. Suspect discharge on 05/22/13.  Disposition: Remain inpatient-home 8/15  DVT Prophylaxis: SCD's  Code Status: Full code   Family Communication Husband at bedside  Procedures:  Thoraccentesis 8/5 and 8/9  VP shunt tap 8/7  CONSULTS:  pulmonary/intensive care, neurology and Neurosurgery   MEDICATIONS: Scheduled Meds: . cefTRIAXone (ROCEPHIN)  IV  2 g Intravenous Q12H  . docusate sodium  100 mg Oral BID  . metroNIDAZOLE  500 mg Oral 3 times per day  . pantoprazole (PROTONIX) IV  40 mg Intravenous QHS  . vancomycin  1,250 mg Intravenous Q8H   Continuous Infusions: . 0.9 % NaCl with KCl 20 mEq / L Stopped (05/21/14 0800)   PRN Meds:.acetaminophen, acetaminophen, HYDROcodone-acetaminophen, labetalol, morphine injection, ondansetron (ZOFRAN) IV, ondansetron, promethazine  Antibiotics: Anti-infectives   Start     Dose/Rate Route  Frequency Ordered Stop   05/20/14 1500  vancomycin (  VANCOCIN) 1,250 mg in sodium chloride 0.9 % 250 mL IVPB     1,250 mg 166.7 mL/hr over 90 Minutes Intravenous Every 8 hours 05/20/14 1315     05/20/14 1230  vancomycin (VANCOCIN) 1,250 mg in sodium chloride 0.9 % 250 mL IVPB  Status:  Discontinued     1,250 mg 166.7 mL/hr over 90 Minutes Intravenous Every 8 hours 05/20/14 1224 05/20/14 1315   05/20/14 1200  ceFAZolin (ANCEF) IVPB 2 g/50 mL premix     2 g 100 mL/hr over 30 Minutes Intravenous Every 8 hours 05/20/14 1148 05/20/14 2015   05/20/14 0837  bacitracin 50,000 Units in sodium chloride irrigation 0.9 % 500 mL irrigation  Status:  Discontinued       As needed 05/20/14 0837 05/20/14 0917   05/20/14 0600  ceFAZolin (ANCEF) IVPB 2 g/50 mL premix  Status:  Discontinued     2 g 100 mL/hr over 30 Minutes Intravenous On call to O.R. 05/19/14 1439 05/20/14 1148   05/18/14 1300  vancomycin (VANCOCIN) 1,250 mg in sodium chloride 0.9 % 250 mL IVPB  Status:  Discontinued     1,250 mg 166.7 mL/hr over 90 Minutes Intravenous Every 8 hours 05/18/14 1210 05/20/14 1148   05/16/14 2000  vancomycin (VANCOCIN) IVPB 750 mg/150 ml premix  Status:  Discontinued     750 mg 150 mL/hr over 60 Minutes Intravenous Every 8 hours 05/16/14 1754 05/18/14 1210   05/16/14 1830  cefTRIAXone (ROCEPHIN) 2 g in dextrose 5 % 50 mL IVPB     2 g 100 mL/hr over 30 Minutes Intravenous Every 12 hours 05/16/14 1721     05/16/14 1730  metroNIDAZOLE (FLAGYL) tablet 500 mg     500 mg Oral 3 times per day 05/16/14 1721         PHYSICAL EXAM: Vital signs in last 24 hours: Filed Vitals:   05/21/14 0600 05/21/14 0751 05/21/14 0800 05/21/14 1030  BP: 106/57  95/55 77/50  Pulse: 66  70 87  Temp:  97.5 F (36.4 C)  98.2 F (36.8 C)  TempSrc:  Oral  Oral  Resp: 23  19 18   Height:      Weight:      SpO2: 100%  94% 97%    Weight change:  Filed Weights   05/12/14 2052 05/20/14 1149  Weight: 65 kg (143 lb 4.8 oz) 68.7 kg  (151 lb 7.3 oz)   Body mass index is 26.84 kg/(m^2).   Gen Exam: Awake and alert with clear speech.   Neck: Supple, No JVD.   Chest: B/L Clear.   CVS: S1 S2 Regular, no murmurs.  Abdomen: soft, BS +, non tender, non distended.  Extremities: no edema, lower extremities warm to touch. Neurologic: Non Focal.   Skin: No Rash.   Wounds: N/A.   Intake/Output from previous day:  Intake/Output Summary (Last 24 hours) at 05/21/14 1347 Last data filed at 05/21/14 0100  Gross per 24 hour  Intake   1515 ml  Output      0 ml  Net   1515 ml     LAB RESULTS: CBC  Recent Labs Lab 05/16/14 0526 05/21/14 0308  WBC 7.7 16.2*  HGB 12.5 12.4  HCT 38.6 38.3  PLT 354 340  MCV 83.4 84.5  MCH 27.0 27.4  MCHC 32.4 32.4  RDW 13.5 13.5    Chemistries   Recent Labs Lab 05/16/14 0526 05/21/14 0308  NA 141 141  K 4.0 4.2  CL 104 103  CO2 26 27  GLUCOSE 99 111*  BUN 11 9  CREATININE 0.69 0.57  CALCIUM 8.8 8.6    CBG: No results found for this basename: GLUCAP,  in the last 168 hours  GFR Estimated Creatinine Clearance: 86.9 ml/min (by C-G formula based on Cr of 0.57).  Coagulation profile No results found for this basename: INR, PROTIME,  in the last 168 hours  Cardiac Enzymes No results found for this basename: CK, CKMB, TROPONINI, MYOGLOBIN,  in the last 168 hours  No components found with this basename: POCBNP,  No results found for this basename: DDIMER,  in the last 72 hours No results found for this basename: HGBA1C,  in the last 72 hours No results found for this basename: CHOL, HDL, LDLCALC, TRIG, CHOLHDL, LDLDIRECT,  in the last 72 hours No results found for this basename: TSH, T4TOTAL, FREET3, T3FREE, THYROIDAB,  in the last 72 hours No results found for this basename: VITAMINB12, FOLATE, FERRITIN, TIBC, IRON, RETICCTPCT,  in the last 72 hours No results found for this basename: LIPASE, AMYLASE,  in the last 72 hours  Urine Studies No results found for this  basename: UACOL, UAPR, USPG, UPH, UTP, UGL, UKET, UBIL, UHGB, UNIT, UROB, ULEU, UEPI, UWBC, URBC, UBAC, CAST, CRYS, UCOM, BILUA,  in the last 72 hours  MICROBIOLOGY: Recent Results (from the past 240 hour(s))  BODY FLUID CULTURE     Status: None   Collection Time    05/12/14  7:50 PM      Result Value Ref Range Status   Specimen Description PLEURAL FLUID RIGHT   Final   Special Requests NONE   Final   Gram Stain     Final   Value: RARE WBC PRESENT,BOTH PMN AND MONONUCLEAR     NO ORGANISMS SEEN     Performed at [facility]   Culture     Final   Value: NO GROWTH 3 DAYS     Performed at [facility]   Report Status 05/16/2014 FINAL   Final  AFB CULTURE WITH SMEAR     Status: None   Collection Time    05/12/14  7:50 PM      Result Value Ref Range Status   Specimen Description PLEURAL FLUID RIGHT   Final   Special Requests NONE   Final   Acid Fast Smear     Final   Value: NO ACID FAST BACILLI SEEN     Performed at [facility]   Culture     Final   Value: CULTURE WILL BE EXAMINED FOR 6 WEEKS BEFORE ISSUING A FINAL REPORT     Performed at [facility]   Report Status PENDING   Incomplete  FUNGUS CULTURE W SMEAR     Status: None   Collection Time    05/12/14  7:50 PM      Result Value Ref Range Status   Specimen Description PLEURAL FLUID RIGHT   Final   Special Requests NONE   Final   Fungal Smear     Final   Value: NO YEAST OR FUNGAL ELEMENTS SEEN     Performed at [facility]   Culture     Final   Value: CULTURE IN PROGRESS FOR FOUR WEEKS     Performed at [facility]   Report Status PENDING   Incomplete  CULTURE, BLOOD (ROUTINE X 2)     Status: None   Collection Time    05/12/14  8:15  PM      Result Value Ref Range Status   Specimen Description BLOOD RIGHT ARM   Final   Special Requests BOTTLES DRAWN AEROBIC AND ANAEROBIC [facility]   Final   Culture  Setup Time     Final   Value: 05/13/2014 00:24     Performed at [facility]   Culture     Final   Value: NO GROWTH 5 DAYS     Performed at [facility]   Report Status 05/19/2014 FINAL   Final  CULTURE, BLOOD (ROUTINE X 2)     Status: None   Collection Time    05/12/14  8:20 PM      Result Value Ref Range Status   Specimen Description BLOOD RIGHT HAND   Final   Special Requests BOTTLES DRAWN AEROBIC ONLY 10CC   Final   Culture  Setup Time     Final   Value: 05/13/2014 00:23     Performed at [facility]   Culture     Final   Value: NO GROWTH 5 DAYS     Performed at [facility]   Report Status 05/19/2014 FINAL   Final  CSF CULTURE     Status: None   Collection Time    05/14/14  3:04 PM      Result Value Ref Range Status   Specimen Description CSF   Final   Special Requests NONE   Final   Gram Stain     Final   Value: WBC PRESENT, PREDOMINANTLY MONONUCLEAR     NO ORGANISMS SEEN     CYTOSPIN Performed at [facility]     Performed at [facility]   Culture     Final   Value: NO GROWTH 3 DAYS     Performed at [facility]   Report Status 05/18/2014 FINAL   Final  GRAM STAIN     Status: None   Collection Time    05/14/14  3:04 PM      Result Value Ref Range Status   Specimen Description CSF   Final   Special Requests NONE   Final   Gram Stain     Final   Value: CYTOSPIN PREP     WBC PRESENT, PREDOMINANTLY MONONUCLEAR     NO ORGANISMS SEEN   Report Status 05/14/2014 FINAL   Final  M. TUBERCULOSIS COMPLEX BY PCR     Status: None   Collection Time    05/15/14  4:10 PM      Result Value Ref Range Status   M. tuberculosis, Direct Not detected  Not detected Final   Comment: (NOTE)     This test(s) was developed and its performance characteristics      have been determined by [facility],      [place], [place]. Performance characteristics refer to the analytical      performance of the test.   Source (MTBPCR) Right Pleural Fluid   Corrected   Comment: Performed at [facility]     CORRECTED ON 08/14 AT 0420: PREVIOUSLY REPORTED AS PLEURAL RIGHT FLUID  AFB CULTURE WITH SMEAR     Status: None   Collection Time    05/15/14  4:10 PM      Result Value Ref Range Status   Specimen Description PLEURAL RIGHT FLUID   Final   Special Requests NONE   Final   Acid Fast Smear     Final  Value: NO ACID FAST BACILLI SEEN     Performed at [facility]   Culture     Final   Value: CULTURE WILL BE EXAMINED FOR 6 WEEKS BEFORE ISSUING A FINAL REPORT     Performed at [facility]   Report Status PENDING   Incomplete  FUNGUS CULTURE W SMEAR     Status: None   Collection Time    05/15/14  4:10 PM      Result Value Ref Range Status   Specimen Description PLEURAL RIGHT   Final   Special Requests NONE   Final   Fungal Smear     Final   Value: NO YEAST OR FUNGAL ELEMENTS SEEN     Performed at [facility]   Culture     Final   Value: CULTURE IN PROGRESS FOR FOUR WEEKS     Performed at [facility]   Report Status PENDING   Incomplete  BODY FLUID CULTURE     Status: None   Collection Time    05/15/14  4:10 PM      Result Value Ref Range Status   Specimen Description PLEURAL RIGHT FLUID   Final   Special Requests NONE   Final   Gram Stain     Final   Value: MODERATE WBC PRESENT,BOTH PMN AND MONONUCLEAR     NO ORGANISMS SEEN     Performed at [facility]   Culture     Final   Value: NO GROWTH 3 DAYS     Performed at [facility]   Report Status 05/19/2014 FINAL   Final  SURGICAL PCR SCREEN     Status: None   Collection Time    05/19/14 11:34 PM      Result Value Ref Range Status   MRSA, PCR NEGATIVE  NEGATIVE Final   Staphylococcus aureus NEGATIVE  NEGATIVE Final   Comment:            The Xpert SA Assay (FDA     approved for NASAL specimens     in patients over 17 years of age),     is one component of     a comprehensive surveillance     program.  Test performance has     been validated by [employer] for patients greater     than or equal to 60 year old.     It is not intended     to diagnose infection nor to     guide or monitor treatment.    RADIOLOGY STUDIES/RESULTS: Dg Chest 1 View  05/15/2014   CLINICAL DATA:  Post right thoracentesis  EXAM: CHEST - 1 VIEW  COMPARISON:  05/14/2014  FINDINGS: No pneumothorax status post right thoracentesis.  Moderate right pleural effusion. Right pleural drain. Associated right lower lobe opacity, likely compressive atelectasis.  Left lung is clear.  The heart is normal in size.  IMPRESSION: No pneumothorax status post right thoracentesis.  Moderate right pleural effusion with right pleural drain.   Electronically Signed   By: Charline Bills M.D.   On: 05/15/2014 16:36   Dg Chest 2 View  05/14/2014   CLINICAL DATA:  38 year old female with cough and pleural effusion. Ventriculoperitoneal shunt.  EXAM: CHEST  2 VIEW  COMPARISON:  05/12/2014 and prior radiographs and CTs  FINDINGS: A moderate right pleural effusion is unchanged with right lower lung atelectasis.  The left lung is clear.  The cardiomediastinal  silhouette is stable.  A ventriculoperitoneal shunt is again identified overlying the right chest with tip overlying the lower right hemithorax. There is no evidence of pneumothorax.  IMPRESSION: Unchanged moderate right pleural effusion with right basilar atelectasis. Unchanged position of ventriculoperitoneal shunt.   Electronically Signed   By: Laveda Abbe M.D.   On: 05/14/2014 20:22   Dg Chest 2 View  05/12/2014   CLINICAL DATA:  Cough and congestion.  EXAM: CHEST  2 VIEW  COMPARISON:  05/11/2014  FINDINGS: Two views of this chest demonstrate a right ventricular-pleural shunt catheter. There appears to be slightly increased right pleural fluid with compressive atelectasis. The right pleural effusion is moderate-to-large in size. The left lung remains clear. Trachea is midline. Heart size is stable.  IMPRESSION: There is a moderate-to-large sized right  pleural effusion which has slightly enlarged since 05/11/2014.  Right ventricular-pleural shunt.   Electronically Signed   By: Richarda Overlie M.D.   On: 05/12/2014 17:14   Dg Chest 2 View  05/11/2014   CLINICAL DATA:  Cough and decreased breath sounds on the right  EXAM: CHEST  2 VIEW  COMPARISON:  Chest radiograph December 30, 2013 and chest CT December 30, 2013  FINDINGS: There is a persistent pleural effusion on the right which may be slightly increased. There is atelectasis and possible consolidation in the right middle and lower lobes. The left lung is clear. Heart size and pulmonary vascularity are normal. No adenopathy. There is a shunt catheter on the right which appears coiled in the right hemi thorax.  IMPRESSION: Shunt catheter appears coiled in the right hemithorax. The sizable effusion on the right may at least in part be due to fluid collecting in the right chest from the shunt catheter. There is probable compressive atelectasis in the right middle and lower lobes. Left lung is clear.  These results will be called to the ordering clinician or representative by the Radiologist Assistant, and communication documented in the PACS or zVision Dashboard.   Electronically Signed   By: Bretta Bang M.D.   On: 05/11/2014 16:38   Dg Abd 1 View  05/12/2014   CLINICAL DATA:  Cough and congestion, fever, VP shunt  EXAM: ABDOMEN - 1 VIEW  COMPARISON:  None.  FINDINGS: The bowel gas pattern is normal. No radio-opaque calculi or other significant radiographic abnormality are seen. Mild stool volume. No VP shunt tubing is identified. IUD in place over the pelvis.  IMPRESSION: Negative.   Electronically Signed   By: Christiana Pellant M.D.   On: 05/12/2014 17:12   Ct Head Wo Contrast  05/12/2014   CLINICAL DATA:  Headache.  History of VP shunt.  EXAM: CT HEAD WITHOUT CONTRAST  TECHNIQUE: Contiguous axial images were obtained from the base of the skull through the vertex without intravenous contrast.  COMPARISON:   12/04/2013  FINDINGS: Stable position of the ventriculostomy catheter. Maximal Min ventricular diameter is stable at 4.2 cm. Stable prominent temporal horns. No extra-axial fluid collections are identified. The gray-white differentiation is maintained. No findings for hemispheric infarction an or intracranial hemorrhage. The brainstem and cerebellum are grossly normal and stable.  No significant bony findings.  IMPRESSION: Stable ventriculostomy shunt catheter.  Stable appearance and size of the ventricles. No mass effect or shift.  No acute intracranial findings.   Electronically Signed   By: Loralie Champagne M.D.   On: 05/12/2014 17:01   Ct Chest W Contrast  05/12/2014   CLINICAL DATA:  Progressive right pleural effusion.  Ventricular shunt  EXAM: CT CHEST WITH CONTRAST  TECHNIQUE: Multidetector CT imaging of the chest was performed during intravenous contrast administration.  CONTRAST:  75mL OMNIPAQUE IOHEXOL 300 MG/ML  SOLN  COMPARISON:  CT chest 12/30/2013  FINDINGS: Ventricular pleural shunt is present with the shunt tubing terminating in the pleural space on the right posteriorly, unchanged from the prior study. Moderate right pleural effusion has progressed. There is increased loculated fluid anteriorly in the base on the right. Progression of right lower lobe atelectasis.  Negative for pleural effusion on the left. No mass or adenopathy is detected.  The aortic arch is normal.  Pulmonary arteries opacified normally.  IMPRESSION: Mild increase in right pleural effusion with a loculated component anteriorly. Increase in right lower lobe compressive atelectasis.  Left lung remains clear.   Electronically Signed   By: Marlan Palauharles  Clark M.D.   On: 05/12/2014 21:02   Dg Chest Port 1 View  05/12/2014   CLINICAL DATA:  Post thoracentesis  EXAM: PORTABLE CHEST - 1 VIEW  COMPARISON:  05/12/2014  FINDINGS: There is small to moderate right pleural effusion with right basilar atelectasis or infiltrate. No pulmonary  edema. No pneumothorax. Right ventricular pleural catheter is stable in position.  IMPRESSION: Small to moderate right pleural effusion with right basilar atelectasis or infiltrate. No pulmonary edema. No pneumothorax.   Electronically Signed   By: Natasha MeadLiviu  Pop M.D.   On: 05/12/2014 20:01   [place] Thoracentesis Asp Pleural Space W/img Guide  05/15/2014   CLINICAL DATA:  Right pleural effusion. Right ventricular pleural shunt.  EXAM: ULTRASOUND GUIDED RIGHT THORACENTESIS  COMPARISON:  CT 05/12/2014  PROCEDURE: An ultrasound guided thoracentesis was thoroughly discussed with the patient and questions answered. The benefits, risks, alternatives and complications were also discussed. The patient understands and wishes to proceed with the procedure. Written consent was obtained.  Ultrasound was performed to localize and mark an adequate pocket of fluid in the right posterior chest. The area was then prepped and draped in the normal sterile fashion. 1% Lidocaine was used for local anesthesia. Under ultrasound guidance a 19 gauge Yueh catheter was introduced. Thoracentesis was performed. The catheter was removed and a dressing applied.  Complications:  None.  FINDINGS: A total of approximately 600 mL of yellow fluid was removed. A fluid sample wassent for laboratory analysis. The right pleural fluid is complex with septations.  IMPRESSION: Successful ultrasound guided right thoracentesis yielding 600 ml of pleural fluid.   Electronically Signed   By: Richarda OverlieAdam  Henn M.D.   On: 05/15/2014 16:30    Jeoffrey MassedGHIMIRE,SHANKER, MD  [facility] Pager:[phone]  If 7PM-7AM, please contact night-coverage [email or web address] Password [email or web address] 05/21/2014, 1:47 PM   LOS: 9 days   **Disclaimer: This note may have been dictated with voice recognition software. Similar sounding words can inadvertently be transcribed and this note may contain transcription errors which may not have been corrected upon publication of note.**

## 2014-05-21 NOTE — Progress Notes (Signed)
Patient is to go home on IV antibiotics; HHC choice offered to patient, [facility] for [facility]; Alexis GoodellB Militza Devery RN,BSN,MHA [phone]

## 2014-05-21 NOTE — Evaluation (Signed)
Physical Therapy Evaluation/ Discharge Patient Details Name: [name] MRN: [id number] DOB: [birth year] Today's Date: 05/21/2014   History of Present Illness  Pt admitted with HA and SOB found to have right pleural effusion with hx of hydrocephalus s/p VP shunt 2001 with multiple replacement surgeries, shunt tap 8/7 and replacement 8/13. Thoracentesis 8/5  Clinical Impression  Pt moving very well without SOB, LOB or difficulty with transfers or gait. 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 Pt very pleasant and does not currently demonstrate any deficits. Will discontinue therapy with pt agreement.     Follow Up Recommendations No PT follow up    Equipment Recommendations  None recommended by PT    Recommendations for Other Services       Precautions / Restrictions Precautions Precautions: None      Mobility  Bed Mobility Overal bed mobility: Independent                Transfers Overall transfer level: Independent                  Ambulation/Gait Ambulation/Gait assistance: Independent Ambulation Distance (Feet): 500 Feet Assistive device: None Gait Pattern/deviations: WFL(Within Functional Limits)        Stairs Stairs: Yes Stairs assistance: Modified independent (Device/Increase time) Stair Management: One rail Right;Forwards;Alternating pattern Number of Stairs: 11    Wheelchair Mobility    Modified Rankin (Stroke Patients Only)       Balance Overall balance assessment: No apparent balance deficits (not formally assessed)                                           Pertinent Vitals/Pain Pain Assessment: No/denies pain    Home Living Family/patient expects to be discharged to:: Private residence Living Arrangements: Spouse/significant other;Children Available Help at Discharge: Family;Available 24 hours/day Type of Home: House Home Access: Stairs  to enter   [employer] of Steps: 3 Home Layout: Two level;Able to live on main level with bedroom/bathroom Home Equipment: None      Prior Function Level of Independence: Independent               Hand Dominance        Extremity/Trunk Assessment   Upper Extremity Assessment: Overall WFL for tasks assessed           Lower Extremity Assessment: Overall WFL for tasks assessed      Cervical / Trunk Assessment: Normal  Communication      Cognition Arousal/Alertness: Awake/alert Behavior During Therapy: WFL for tasks assessed/performed Overall Cognitive Status: Within Functional Limits for tasks assessed                      General Comments      Exercises        Assessment/Plan    PT Assessment Patent does not need any further PT services  PT Diagnosis     PT Problem List    PT Treatment Interventions     PT Goals (Current goals can be found in the Care Plan section) Acute Rehab PT Goals PT Goal Formulation: No goals set, d/c therapy    Frequency     Barriers to discharge        Co-evaluation  End of Session   Activity Tolerance: Patient tolerated treatment well Patient left: in bed;with call bell/phone within reach           Time: 1315-1325 PT Time Calculation (min): 10 min   Charges:   PT Evaluation $Initial PT Evaluation Tier I: 1 Procedure     PT G Codes:          Tabor, Winslow Ederer BDelorse Leketh 05/21/2014, 1:30 PM Delaney MeigsMaija Tabor Ercole Georg, PT [phone]

## 2014-05-21 NOTE — Progress Notes (Signed)
1 Day Post-Op  Subjective: Comfortable.  Objective: Vital signs in last 24 hours: Temp:  [97.5 F (36.4 C)-98.2 F (36.8 C)] 98.2 F (36.8 C) (08/14 1030) Pulse Rate:  [59-87] 87 (08/14 1030) Resp:  [12-23] 18 (08/14 1030) BP: (77-111)/(46-66) 77/50 mmHg (08/14 1030) SpO2:  [88 %-100 %] 97 % (08/14 1030) Last BM Date: 05/19/14  Intake/Output from previous day: 08/13 0701 - 08/14 0700 In: 2976.3 [P.O.:480; I.V.:1796.3; IV Piggyback:700] Out: 25 [Blood:25] Intake/Output this shift:    PE: General- In NAD Abdomen-soft, dressings dry  Lab Results:   Recent Labs  05/21/14 0308  WBC 16.2*  HGB 12.4  HCT 38.3  PLT 340   BMET  Recent Labs  05/21/14 0308  NA 141  K 4.2  CL 103  CO2 27  GLUCOSE 111*  BUN 9  CREATININE 0.57  CALCIUM 8.6   PT/INR No results found for this basename: LABPROT, INR,  in the last 72 hours Comprehensive Metabolic Panel:    Component Value Date/Time   NA 141 05/21/2014 0308   NA 141 05/16/2014 0526   K 4.2 05/21/2014 0308   K 4.0 05/16/2014 0526   CL 103 05/21/2014 0308   CL 104 05/16/2014 0526   CO2 27 05/21/2014 0308   CO2 26 05/16/2014 0526   BUN 9 05/21/2014 0308   BUN 11 05/16/2014 0526   CREATININE 0.57 05/21/2014 0308   CREATININE 0.69 05/16/2014 0526   CREATININE 0.69 12/24/2013 0813   CREATININE 0.81 03/17/2012 0852   GLUCOSE 111* 05/21/2014 0308   GLUCOSE 99 05/16/2014 0526   CALCIUM 8.6 05/21/2014 0308   CALCIUM 8.8 05/16/2014 0526   AST 17 05/15/2014 1835   AST 13 05/13/2014 0753   ALT 19 05/15/2014 1835   ALT 14 05/13/2014 0753   ALKPHOS 84 05/15/2014 1835   ALKPHOS 79 05/13/2014 0753   BILITOT 0.3 05/15/2014 1835   BILITOT 0.9 05/13/2014 0753   PROT 7.3 05/15/2014 1835   PROT 6.7 05/13/2014 0753   ALBUMIN 2.9* 05/15/2014 1835   ALBUMIN 2.7* 05/13/2014 0753     Studies/Results: Ct Head Wo Contrast  05/21/2014   CLINICAL DATA:  Status post shunt revision.  EXAM: CT HEAD WITHOUT CONTRAST  TECHNIQUE: Contiguous axial images were obtained from the base  of the skull through the vertex without intravenous contrast.  COMPARISON:  Prior CT from 05/12/2014.  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 The VP shunt catheter is again seen extending via a right parietal approach. The catheter crosses the lateral ventricles with tip located in the periventricular white matter of the left frontal lobe. No hemorrhage seen along the ventricular tract. Ventricular size is decreased as compared to the prior exam with no hydrocephalus. There remains slight asymmetric enlargement of the right lateral ventricle as compared to the left. Third ventricular dilatation is improved. Fourth ventricle is stable in size.  Remote shunt tract noted within the periventricular white matter of the right frontal lobe. No acute intracranial hemorrhage or large vessel territory infarct. No extra-axial fluid collection. No mass or midline shift.  Calvarium is intact. Remote burr hole present within the right frontal calvarium.  Skin staples overlie the shunt reservoir in the right parietal scalp. There is a few small foci of soft tissue emphysema.  No acute abnormality seen about the orbits. Paranasal sinuses and mastoid air cells are clear.  IMPRESSION: Sequelae of interval shunt revision with tip of right parietal approach shunt catheter in the periventricular white  matter of the left frontal lobe. Ventriculomegaly involving the lateral and third ventricles is improved as compared to prior study.   Electronically Signed   By: Rise MuBenjamin  McClintock M.D.   On: 05/21/2014 07:10   Dg Chest Port 1 View  05/21/2014   CLINICAL DATA:  Followup pleural effusion  EXAM: PORTABLE CHEST - 1 VIEW  COMPARISON:  05/15/2014  FINDINGS: Cardiac shadow remains enlarged. The left lung is clear. A right-sided pleural effusion is seen which is stable from the prior study. It appeared somewhat loculated on recent thoracentesis ultrasound. Shunt catheter is again noted.  IMPRESSION:  Stable right-sided pleural effusion.   Electronically Signed   By: Alcide CleverMark  Lukens M.D.   On: 05/21/2014 10:06    Anti-infectives: Anti-infectives   Start     Dose/Rate Route Frequency Ordered Stop   05/21/14 0000  vancomycin 1,250 mg in sodium chloride 0.9 % 250 mL  Status:  Discontinued     1,250 mg 166.7 mL/hr over 90 Minutes Intravenous Every 8 hours 05/21/14 1351 05/21/14    05/21/14 0000  vancomycin 1,250 mg in sodium chloride 0.9 % 250 mL     1,250 mg 166.7 mL/hr over 90 Minutes Intravenous Every 8 hours 05/21/14 1355     05/20/14 1500  vancomycin (VANCOCIN) 1,250 mg in sodium chloride 0.9 % 250 mL IVPB     1,250 mg 166.7 mL/hr over 90 Minutes Intravenous Every 8 hours 05/20/14 1315     05/20/14 1230  vancomycin (VANCOCIN) 1,250 mg in sodium chloride 0.9 % 250 mL IVPB  Status:  Discontinued     1,250 mg 166.7 mL/hr over 90 Minutes Intravenous Every 8 hours 05/20/14 1224 05/20/14 1315   05/20/14 1200  ceFAZolin (ANCEF) IVPB 2 g/50 mL premix     2 g 100 mL/hr over 30 Minutes Intravenous Every 8 hours 05/20/14 1148 05/20/14 2015   05/20/14 0837  bacitracin 50,000 Units in sodium chloride irrigation 0.9 % 500 mL irrigation  Status:  Discontinued       As needed 05/20/14 0837 05/20/14 0917   05/20/14 0600  ceFAZolin (ANCEF) IVPB 2 g/50 mL premix  Status:  Discontinued     2 g 100 mL/hr over 30 Minutes Intravenous On call to O.R. 05/19/14 1439 05/20/14 1148   05/18/14 1300  vancomycin (VANCOCIN) 1,250 mg in sodium chloride 0.9 % 250 mL IVPB  Status:  Discontinued     1,250 mg 166.7 mL/hr over 90 Minutes Intravenous Every 8 hours 05/18/14 1210 05/20/14 1148   05/16/14 2000  vancomycin (VANCOCIN) IVPB 750 mg/150 ml premix  Status:  Discontinued     750 mg 150 mL/hr over 60 Minutes Intravenous Every 8 hours 05/16/14 1754 05/18/14 1210   05/16/14 1830  cefTRIAXone (ROCEPHIN) 2 g in dextrose 5 % 50 mL IVPB  Status:  Discontinued     2 g 100 mL/hr over 30 Minutes Intravenous Every 12 hours  05/16/14 1721 05/21/14 1350   05/16/14 1730  metroNIDAZOLE (FLAGYL) tablet 500 mg  Status:  Discontinued     500 mg Oral 3 times per day 05/16/14 1721 05/21/14 1350      Assessment Hydrocephalus with  Nonfunctioning ventriculoperitoneal shunt-s/p laparoscopic assisted placement of new vp shunt-doing well    LOS: 9 days   Plan: May remove bandages on abdominal wall in 2 days.  Follow up with me prn.   Caroly Purewal J 05/21/2014

## 2014-05-21 NOTE — Progress Notes (Signed)
[facility]    Date of Admission:  05/12/2014    Total days of antibiotics 6        Day 6 Rocephin        Day 6 Flagyl        Day 6 Vancomycin         Active Problems:   Pleural effusion, right   Fever   Headache   Nonfunctioning ventriculoperitoneal shunt   . cefTRIAXone (ROCEPHIN)  IV  2 g Intravenous Q12H  . docusate sodium  100 mg Oral BID  . metroNIDAZOLE  500 mg Oral 3 times per day  . pantoprazole (PROTONIX) IV  40 mg Intravenous QHS  . vancomycin  1,250 mg Intravenous Q8H    Subjective: Still w/ cough, although claims it's only when she moves or feels anxious. No fevers overnight, BP low this morning. Denies headache, nausea, vomiting.   Past Medical History  Diagnosis Date  . VP (ventriculoperitoneal) shunt status   . Chronic headaches   . Chronic cough   . 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 Ventriculopleural shunt status     History  Substance Use Topics  . Smoking status: Never Smoker   . Smokeless tobacco: Never Used  . Alcohol Use: No    Family History  Problem Relation Age of Onset  . Diabetes Father     No Known Allergies  Objective: Temp:  [97.5 F (36.4 C)-98.3 F (36.8 C)] 97.5 F (36.4 C) (08/14 0751) Pulse Rate:  [56-83] 70 (08/14 0800) Resp:  [0-23] 19 (08/14 0800) BP: (86-111)/(46-66) 95/55 mmHg (08/14 0800) SpO2:  [88 %-100 %] 94 % (08/14 0800) Weight:  [151 lb 7.3 oz (68.7 kg)] 151 lb 7.3 oz (68.7 kg) (08/13 1149)  Physical Exam: General: Alert, cooperative, no acute distress. HEENT: PERRL. Right scalp w/ large bandage, no obvious drainage. Appears clean and dry. Small area of dried blood on left/central occiput.  Neck: Full range of motion without pain, supple. Lungs: Decreased breath sounds on right base. Otherwise no wheezes or rales. Cough w/ deep inspiration.  Heart: Regular rate and rhythm, no murmurs. Abdomen: Soft, non-tender, non-distended, bowel sounds  present.  Extremities: No cyanosis, clubbing, or edema Neurologic: Alert & oriented X3, cranial nerves II-XII intact, strength grossly intact, sensation intact to light touch   Lab Results Lab Results  Component Value Date   WBC 16.2* 05/21/2014   HGB 12.4 05/21/2014   HCT 38.3 05/21/2014   MCV 84.5 05/21/2014   PLT 340 05/21/2014    Lab Results  Component Value Date   CREATININE 0.57 05/21/2014   BUN 9 05/21/2014   NA 141 05/21/2014   K 4.2 05/21/2014   CL 103 05/21/2014   CO2 27 05/21/2014    Lab Results  Component Value Date   ALT 19 05/15/2014   AST 17 05/15/2014   ALKPHOS 84 05/15/2014   BILITOT 0.3 05/15/2014      Microbiology: Recent Results (from the past 240 hour(s))  BODY FLUID CULTURE     Status: None   Collection Time    05/12/14  7:50 PM      Result Value Ref Range Status   Specimen Description PLEURAL FLUID RIGHT   Final   Special Requests NONE   Final   Gram Stain     Final   Value: RARE WBC PRESENT,BOTH PMN AND MONONUCLEAR  NO ORGANISMS SEEN     Performed at [facility]   Culture     Final   Value: NO GROWTH 3 DAYS     Performed at [facility]   Report Status 05/16/2014 FINAL   Final  AFB CULTURE WITH SMEAR     Status: None   Collection Time    05/12/14  7:50 PM      Result Value Ref Range Status   Specimen Description PLEURAL FLUID RIGHT   Final   Special Requests NONE   Final   Acid Fast Smear     Final   Value: NO ACID FAST BACILLI SEEN     Performed at [facility]   Culture     Final   Value: CULTURE WILL BE EXAMINED FOR 6 WEEKS BEFORE ISSUING A FINAL REPORT     Performed at [facility]   Report Status PENDING   Incomplete  FUNGUS CULTURE W SMEAR     Status: None   Collection Time    05/12/14  7:50 PM      Result Value Ref Range Status   Specimen Description PLEURAL FLUID RIGHT   Final   Special Requests NONE   Final   Fungal Smear     Final   Value: NO YEAST OR FUNGAL ELEMENTS SEEN     Performed at [facility]   Culture     Final   Value: CULTURE IN PROGRESS FOR FOUR WEEKS     Performed at [facility]   Report Status PENDING   Incomplete  CULTURE, BLOOD (ROUTINE X 2)     Status: None   Collection Time    05/12/14  8:15 PM      Result Value Ref Range Status   Specimen Description BLOOD RIGHT ARM   Final   Special Requests BOTTLES DRAWN AEROBIC AND ANAEROBIC 6CC   Final   Culture  Setup Time     Final   Value: 05/13/2014 00:24     Performed at [facility]   Culture     Final   Value: NO GROWTH 5 DAYS     Performed at [facility]   Report Status 05/19/2014 FINAL   Final  CULTURE, BLOOD (ROUTINE X 2)     Status: None   Collection Time    05/12/14  8:20 PM      Result Value Ref Range Status   Specimen Description BLOOD RIGHT HAND   Final   Special Requests BOTTLES DRAWN AEROBIC ONLY 10CC   Final   Culture  Setup Time     Final   Value: 05/13/2014 00:23     Performed at [facility]   Culture     Final   Value: NO GROWTH 5 DAYS     Performed at [facility]   Report Status 05/19/2014 FINAL   Final  CSF CULTURE     Status: None   Collection Time    05/14/14  3:04 PM      Result Value Ref Range Status   Specimen Description CSF   Final   Special Requests NONE   Final   Gram Stain     Final   Value: WBC PRESENT, PREDOMINANTLY MONONUCLEAR     NO ORGANISMS SEEN     CYTOSPIN Performed at [facility]     Performed at [facility]   Culture     Final   Value:  NO GROWTH 3 DAYS     Performed at [facility]   Report Status 05/18/2014 FINAL   Final  GRAM STAIN     Status: None   Collection Time    05/14/14  3:04 PM      Result Value Ref Range Status   Specimen Description CSF   Final   Special Requests NONE   Final   Gram Stain     Final   Value: CYTOSPIN PREP     WBC PRESENT, PREDOMINANTLY MONONUCLEAR     NO ORGANISMS SEEN   Report Status 05/14/2014 FINAL   Final  M. TUBERCULOSIS COMPLEX BY PCR      Status: None   Collection Time    05/15/14  4:10 PM      Result Value Ref Range Status   M. tuberculosis, Direct Not detected  Not detected Final   Comment: (NOTE)     This test(s) was developed and its performance characteristics      have been determined by [facility],      [place], [place]. Performance characteristics refer to the analytical      performance of the test.   Source (MTBPCR) Right Pleural Fluid   Corrected   Comment: Performed at [facility]     CORRECTED ON 08/14 AT 0420: PREVIOUSLY REPORTED AS PLEURAL RIGHT FLUID  AFB CULTURE WITH SMEAR     Status: None   Collection Time    05/15/14  4:10 PM      Result Value Ref Range Status   Specimen Description PLEURAL RIGHT FLUID   Final   Special Requests NONE   Final   Acid Fast Smear     Final   Value: NO ACID FAST BACILLI SEEN     Performed at [facility]   Culture     Final   Value: CULTURE WILL BE EXAMINED FOR 6 WEEKS BEFORE ISSUING A FINAL REPORT     Performed at [facility]   Report Status PENDING   Incomplete  FUNGUS CULTURE W SMEAR     Status: None   Collection Time    05/15/14  4:10 PM      Result Value Ref Range Status   Specimen Description PLEURAL RIGHT   Final   Special Requests NONE   Final   Fungal Smear     Final   Value: NO YEAST OR FUNGAL ELEMENTS SEEN     Performed at [facility]   Culture     Final   Value: CULTURE IN PROGRESS FOR FOUR WEEKS     Performed at [facility]   Report Status PENDING   Incomplete  BODY FLUID CULTURE     Status: None   Collection Time    05/15/14  4:10 PM      Result Value Ref Range Status   Specimen Description PLEURAL RIGHT FLUID   Final   Special Requests NONE   Final   Gram Stain     Final   Value: MODERATE WBC PRESENT,BOTH PMN AND MONONUCLEAR     NO ORGANISMS SEEN     Performed at [facility]   Culture     Final   Value: NO GROWTH 3 DAYS     Performed at [facility]    Report Status 05/19/2014 FINAL   Final  SURGICAL PCR SCREEN     Status: None   Collection Time    05/19/14 11:34 PM  Result Value Ref Range Status   MRSA, PCR NEGATIVE  NEGATIVE Final   Staphylococcus aureus NEGATIVE  NEGATIVE Final   Comment:            The Xpert SA Assay (FDA     approved for NASAL specimens     in patients over 60 years of age),     is one component of     a comprehensive surveillance     program.  Test performance has     been validated by [employer] for patients greater     than or equal to 7 year old.     It is not intended     to diagnose infection nor to     guide or monitor treatment.    Assessment: 38 y/o F w/ h/o hydrocephalus w/ ventriculopleural shunt, admitted on 05/12/14 for worsened right-sided pleural effusion w/ increased cough and headache. Effusion found to be exudative on 2 occasions, started on Rocephin, Flagyl, and Vancomycin for concern of indolent shunt infection. Yesterday, patient underwent revision/replecement of ventriculopleural shunt, which was changed to a ventriculoperitoneal shunt. The distal portion of the shunt still remains according to op note. CXR repeated today shows right pleural effusion w/ distal portion of previous shunt still present, in addition to newly revised VP shunt. WBC's 16.2 this morning. Patient hypotensive to 77/50, no fever or chills, not tachycardic. CT head performed also shows improved ventriculomegaly. Still some concern for smoldering pleural infection. Given taht the patient is not acutely ill, suspect if any infection, most likely coagulase negative staph. Will discontinue Flagyl and Ceftriaxone at this time as do not suspect anaerobic or gram negative infection. All cultures continue to be negative.   Plan: 1. Continue Vancomycin for 1 more week (end date 05/28/14).  2. Discontinue Flagyl + Rocephin 3. Insert midline vs PICC for continued antibiotic therapy  Lars Masson, MD PGY-2, Internal  Medicine Pager: [phone] 05/21/2014, 10:08 AM  Attending addendum: She is feeling better after shunt replacement yesterday. Dr. Lovell Sheehan was unable to remove the distal, pleural portion of her old shunt silhouette remains in place. Her chest x-ray shows no change in the right pleural effusion. All pleural fluid and CSF cultures remain negative. I cannot rule out smoldering, low-grade infection. This would most commonly occur with coagulase negative staph. I will give her one week of IV vancomycin after shunt replacement then stop and observe how she does. Please call Dr. Merceda Elks [phone] for any infectious disease questions this weekend  Cliffton Asters, MD [facility] for Infectious Disease [facility] [phone] pager   [phone] cell 05/21/2014, 2:23 PM

## 2014-05-22 DIAGNOSIS — T889XXS Complication of surgical and medical care, unspecified, sequela: Secondary | ICD-10-CM

## 2014-05-22 MED ORDER — ACETAMINOPHEN 325 MG PO TABS: 650.0000 mg | ORAL_TABLET | ORAL | Status: DC | PRN

## 2014-05-22 MED ORDER — VANCOMYCIN HCL 10 G IV SOLR
1250.0000 mg | Freq: Three times a day (TID) | INTRAVENOUS | Status: AC
Start: 2014-05-22 — End: 2014-05-29

## 2014-05-22 MED ORDER — HEPARIN SOD (PORK) LOCK FLUSH 100 UNIT/ML IV SOLN
250.0000 [IU] | INTRAVENOUS | Status: AC | PRN
  Administered 2014-05-22: 250 [IU]

## 2014-05-22 NOTE — Discharge Summary (Signed)
PATIENT DETAILS Name: [name] Age: 38 y.o. Sex: female Date of Birth: 1975-12-01 MRN: [id number]. Admit Date: 05/12/2014 Admitting Physician: Maretta Bees, MD PCP:Pcp Not In System  Recommendations for Outpatient Follow-up:  1. Vancomycin for one more week from 05/22/14. Please monitor BMET/Trough level closely 2. Please follow pleural fluid cultures till negative 3. Please repeat CXR in 4-6 weeks.  PRIMARY DISCHARGE DIAGNOSIS:  Active Problems:   Pleural effusion, right   Fever   Headache   Nonfunctioning ventriculoperitoneal shunt      PAST MEDICAL HISTORY: Past Medical History  Diagnosis Date  . VP (ventriculoperitoneal) shunt status   . Chronic headaches   . Chronic cough   . 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 Ventriculopleural shunt status     DISCHARGE MEDICATIONS:   Medication List    STOP taking these medications       benzonatate 100 MG capsule  Commonly known as:  TESSALON     cefdinir 300 MG capsule  Commonly known as:  OMNICEF     HYDROmorphone 2 MG tablet  Commonly known as:  DILAUDID     naproxen sodium 220 MG tablet  Commonly known as:  ANAPROX     promethazine-dextromethorphan 6.25-15 MG/5ML syrup  Commonly known as:  PROMETHAZINE-DM      TAKE these medications       acetaminophen 325 MG tablet  Commonly known as:  TYLENOL  Take 2 tablets (650 mg total) by mouth every 4 (four) hours as needed for mild pain (temp > 100.5).     vancomycin 1,250 mg in sodium chloride 0.9 % 250 mL  Inject 1,250 mg into the vein every 8 (eight) hours. 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 The patient underwent a revision performed by Dr. Tressie Stalker on 12/03/13. Since that period of time, the patient has  complained of headache with intermittent nausea with nonproductive cough. She presented to the emergency department on 05/12/2014 with progressive headache, worsening cough and shortness of breath. Chest x-ray in the emergency department revealed her worsening right-sided pleural effusion.  CONSULTATIONS:   ID, general surgery and Neurosurgery  PERTINENT RADIOLOGIC STUDIES: Dg Chest 1 View  05/15/2014   CLINICAL DATA:  Post right thoracentesis  EXAM: CHEST - 1 VIEW  COMPARISON:  05/14/2014  FINDINGS: No pneumothorax status post right thoracentesis.  Moderate right pleural effusion. Right pleural drain. Associated right lower lobe opacity, likely compressive atelectasis.  Left lung is clear.  The heart is normal in size.  IMPRESSION: No pneumothorax status post right thoracentesis.  Moderate right pleural effusion with right pleural drain.   Electronically Signed   By: Charline Bills M.D.   On: 05/15/2014 16:36   Dg Chest 2 View  05/14/2014   CLINICAL DATA:  38 year old female with cough and pleural effusion. Ventriculoperitoneal shunt.  EXAM: CHEST  2 VIEW  COMPARISON:  05/12/2014 and prior radiographs and CTs  FINDINGS: A moderate right pleural effusion is unchanged with right lower lung atelectasis.  The left lung is clear.  The cardiomediastinal silhouette is stable.  A ventriculoperitoneal shunt is again identified overlying the right chest with tip overlying the lower right hemithorax. There is no evidence of pneumothorax.  IMPRESSION: Unchanged  moderate right pleural effusion with right basilar atelectasis. Unchanged position of ventriculoperitoneal shunt.   Electronically Signed   By: Laveda Abbe M.D.   On: 05/14/2014 20:22   Dg Chest 2 View  05/12/2014   CLINICAL DATA:  Cough and congestion.  EXAM: CHEST  2 VIEW  COMPARISON:  05/11/2014  FINDINGS: Two views of this chest demonstrate a right ventricular-pleural shunt catheter. There appears to be slightly increased right pleural fluid with  compressive atelectasis. The right pleural effusion is moderate-to-large in size. The left lung remains clear. Trachea is midline. Heart size is stable.  IMPRESSION: There is a moderate-to-large sized right pleural effusion which has slightly enlarged since 05/11/2014.  Right ventricular-pleural shunt.   Electronically Signed   By: Richarda Overlie M.D.   On: 05/12/2014 17:14   Dg Chest 2 View  05/11/2014   CLINICAL DATA:  Cough and decreased breath sounds on the right  EXAM: CHEST  2 VIEW  COMPARISON:  Chest radiograph December 30, 2013 and chest CT December 30, 2013  FINDINGS: There is a persistent pleural effusion on the right which may be slightly increased. There is atelectasis and possible consolidation in the right middle and lower lobes. The left lung is clear. Heart size and pulmonary vascularity are normal. No adenopathy. There is a shunt catheter on the right which appears coiled in the right hemi thorax.  IMPRESSION: Shunt catheter appears coiled in the right hemithorax. The sizable effusion on the right may at least in part be due to fluid collecting in the right chest from the shunt catheter. There is probable compressive atelectasis in the right middle and lower lobes. Left lung is clear.  These results will be called to the ordering clinician or representative by the Radiologist Assistant, and communication documented in the PACS or zVision Dashboard.   Electronically Signed   By: Bretta Bang M.D.   On: 05/11/2014 16:38   Dg Abd 1 View  05/12/2014   CLINICAL DATA:  Cough and congestion, fever, VP shunt  EXAM: ABDOMEN - 1 VIEW  COMPARISON:  None.  FINDINGS: The bowel gas pattern is normal. No radio-opaque calculi or other significant radiographic abnormality are seen. Mild stool volume. No VP shunt tubing is identified. IUD in place over the pelvis.  IMPRESSION: Negative.   Electronically Signed   By: Christiana Pellant M.D.   On: 05/12/2014 17:12   Ct Head Wo Contrast  05/21/2014   CLINICAL DATA:   Status post shunt revision.  EXAM: CT HEAD WITHOUT CONTRAST  TECHNIQUE: Contiguous axial images were obtained from the base of the skull through the vertex without intravenous contrast.  COMPARISON:  Prior CT from 05/12/2014.  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 The VP shunt catheter is again seen extending via a right parietal approach. The catheter crosses the lateral ventricles with tip located in the periventricular white matter of the left frontal lobe. No hemorrhage seen along the ventricular tract. Ventricular size is decreased as compared to the prior exam with no hydrocephalus. There remains slight asymmetric enlargement of the right lateral ventricle as compared to the left. Third ventricular dilatation is improved. Fourth ventricle is stable in size.  Remote shunt tract noted within the periventricular white matter of the right frontal lobe. No acute intracranial hemorrhage or large vessel territory infarct. No extra-axial fluid collection. No mass or midline shift.  Calvarium is intact. Remote burr hole present within the right frontal calvarium.  Skin staples overlie the  shunt reservoir in the right parietal scalp. There is a few small foci of soft tissue emphysema.  No acute abnormality seen about the orbits. Paranasal sinuses and mastoid air cells are clear.  IMPRESSION: Sequelae of interval shunt revision with tip of right parietal approach shunt catheter in the periventricular white matter of the left frontal lobe. Ventriculomegaly involving the lateral and third ventricles is improved as compared to prior study.   Electronically Signed   By: Rise Mu M.D.   On: 05/21/2014 07:10   Ct Head Wo Contrast  05/12/2014   CLINICAL DATA:  Headache.  History of VP shunt.  EXAM: CT HEAD WITHOUT CONTRAST  TECHNIQUE: Contiguous axial images were obtained from the base of the skull through the vertex without intravenous contrast.  COMPARISON:  12/04/2013   FINDINGS: Stable position of the ventriculostomy catheter. Maximal Min ventricular diameter is stable at 4.2 cm. Stable prominent temporal horns. No extra-axial fluid collections are identified. The gray-white differentiation is maintained. No findings for hemispheric infarction an or intracranial hemorrhage. The brainstem and cerebellum are grossly normal and stable.  No significant bony findings.  IMPRESSION: Stable ventriculostomy shunt catheter.  Stable appearance and size of the ventricles. No mass effect or shift.  No acute intracranial findings.   Electronically Signed   By: Loralie Champagne M.D.   On: 05/12/2014 17:01   Ct Chest W Contrast  05/12/2014   CLINICAL DATA:  Progressive right pleural effusion. Ventricular shunt  EXAM: CT CHEST WITH CONTRAST  TECHNIQUE: Multidetector CT imaging of the chest was performed during intravenous contrast administration.  CONTRAST:  75mL OMNIPAQUE IOHEXOL 300 MG/ML  SOLN  COMPARISON:  CT chest 12/30/2013  FINDINGS: Ventricular pleural shunt is present with the shunt tubing terminating in the pleural space on the right posteriorly, unchanged from the prior study. Moderate right pleural effusion has progressed. There is increased loculated fluid anteriorly in the base on the right. Progression of right lower lobe atelectasis.  Negative for pleural effusion on the left. No mass or adenopathy is detected.  The aortic arch is normal.  Pulmonary arteries opacified normally.  IMPRESSION: Mild increase in right pleural effusion with a loculated component anteriorly. Increase in right lower lobe compressive atelectasis.  Left lung remains clear.   Electronically Signed   By: Marlan Palau M.D.   On: 05/12/2014 21:02   Dg Chest Port 1 View  05/21/2014   CLINICAL DATA:  Followup pleural effusion  EXAM: PORTABLE CHEST - 1 VIEW  COMPARISON:  05/15/2014  FINDINGS: Cardiac shadow remains enlarged. The left lung is clear. A right-sided pleural effusion is seen which is stable from  the prior study. It appeared somewhat loculated on recent thoracentesis ultrasound. Shunt catheter is again noted.  IMPRESSION: Stable right-sided pleural effusion.   Electronically Signed   By: Alcide Clever M.D.   On: 05/21/2014 10:06   Dg Chest Port 1 View  05/12/2014   CLINICAL DATA:  Post thoracentesis  EXAM: PORTABLE CHEST - 1 VIEW  COMPARISON:  05/12/2014  FINDINGS: There is small to moderate right pleural effusion with right basilar atelectasis or infiltrate. No pulmonary edema. No pneumothorax. Right ventricular pleural catheter is stable in position.  IMPRESSION: Small to moderate right pleural effusion with right basilar atelectasis or infiltrate. No pulmonary edema. No pneumothorax.   Electronically Signed   By: Natasha Mead M.D.   On: 05/12/2014 20:01   US Thoracentesis Asp Pleural Space W/img Guide  05/15/2014   CLINICAL DATA:  Right pleural effusion.  Right ventricular pleural shunt.  EXAM: ULTRASOUND GUIDED RIGHT THORACENTESIS  COMPARISON:  CT 05/12/2014  PROCEDURE: An ultrasound guided thoracentesis was thoroughly discussed with the patient and questions answered. The benefits, risks, alternatives and complications were also discussed. The patient understands and wishes to proceed with the procedure. Written consent was obtained.  Ultrasound was performed to localize and mark an adequate pocket of fluid in the right posterior chest. The area was then prepped and draped in the normal sterile fashion. 1% Lidocaine was used for local anesthesia. Under ultrasound guidance a 19 gauge Yueh catheter was introduced. Thoracentesis was performed. The catheter was removed and a dressing applied.  Complications:  None.  FINDINGS: A total of approximately 600 mL of yellow fluid was removed. A fluid sample wassent for laboratory analysis. The right pleural fluid is complex with septations.  IMPRESSION: Successful ultrasound guided right thoracentesis yielding 600 ml of pleural fluid.   Electronically Signed   By:  Richarda Overlie M.D.   On: 05/15/2014 16:30     PERTINENT LAB RESULTS: CBC:  Recent Labs  05/21/14 0308  WBC 16.2*  HGB 12.4  HCT 38.3  PLT 340   CMET CMP     Component Value Date/Time   NA 141 05/21/2014 0308   K 4.2 05/21/2014 0308   CL 103 05/21/2014 0308   CO2 27 05/21/2014 0308   GLUCOSE 111* 05/21/2014 0308   BUN 9 05/21/2014 0308   CREATININE 0.57 05/21/2014 0308   CREATININE 0.69 12/24/2013 0813   CALCIUM 8.6 05/21/2014 0308   PROT 7.3 05/15/2014 1835   ALBUMIN 2.9* 05/15/2014 1835   AST 17 05/15/2014 1835   ALT 19 05/15/2014 1835   ALKPHOS 84 05/15/2014 1835   BILITOT 0.3 05/15/2014 1835   GFRNONAA >90 05/21/2014 0308   GFRAA >90 05/21/2014 0308    GFR Estimated Creatinine Clearance: 86.9 ml/min (by C-G formula based on Cr of 0.57). No results found for this basename: LIPASE, AMYLASE,  in the last 72 hours No results found for this basename: CKTOTAL, CKMB, CKMBINDEX, TROPONINI,  in the last 72 hours No components found with this basename: POCBNP,  No results found for this basename: DDIMER,  in the last 72 hours No results found for this basename: HGBA1C,  in the last 72 hours No results found for this basename: CHOL, HDL, LDLCALC, TRIG, CHOLHDL, LDLDIRECT,  in the last 72 hours No results found for this basename: TSH, T4TOTAL, FREET3, T3FREE, THYROIDAB,  in the last 72 hours No results found for this basename: VITAMINB12, FOLATE, FERRITIN, TIBC, IRON, RETICCTPCT,  in the last 72 hours Coags: No results found for this basename: PT, INR,  in the last 72 hours Microbiology: Recent Results (from the past 240 hour(s))  BODY FLUID CULTURE     Status: None   Collection Time    05/12/14  7:50 PM      Result Value Ref Range Status   Specimen Description PLEURAL FLUID RIGHT   Final   Special Requests NONE   Final   Gram Stain     Final   Value: RARE WBC PRESENT,BOTH PMN AND MONONUCLEAR     NO ORGANISMS SEEN     Performed at [facility]   Culture     Final   Value: NO  GROWTH 3 DAYS     Performed at [facility]   Report Status 05/16/2014 FINAL   Final  AFB CULTURE WITH SMEAR     Status: None   Collection  Time    05/12/14  7:50 PM      Result Value Ref Range Status   Specimen Description PLEURAL FLUID RIGHT   Final   Special Requests NONE   Final   Acid Fast Smear     Final   Value: NO ACID FAST BACILLI SEEN     Performed at [facility]   Culture     Final   Value: CULTURE WILL BE EXAMINED FOR 6 WEEKS BEFORE ISSUING A FINAL REPORT     Performed at [facility]   Report Status PENDING   Incomplete  FUNGUS CULTURE W SMEAR     Status: None   Collection Time    05/12/14  7:50 PM      Result Value Ref Range Status   Specimen Description PLEURAL FLUID RIGHT   Final   Special Requests NONE   Final   Fungal Smear     Final   Value: NO YEAST OR FUNGAL ELEMENTS SEEN     Performed at [facility]   Culture     Final   Value: CULTURE IN PROGRESS FOR FOUR WEEKS     Performed at [facility]   Report Status PENDING   Incomplete  CULTURE, BLOOD (ROUTINE X 2)     Status: None   Collection Time    05/12/14  8:15 PM      Result Value Ref Range Status   Specimen Description BLOOD RIGHT ARM   Final   Special Requests BOTTLES DRAWN AEROBIC AND ANAEROBIC 6CC   Final   Culture  Setup Time     Final   Value: 05/13/2014 00:24     Performed at [facility]   Culture     Final   Value: NO GROWTH 5 DAYS     Performed at [facility]   Report Status 05/19/2014 FINAL   Final  CULTURE, BLOOD (ROUTINE X 2)     Status: None   Collection Time    05/12/14  8:20 PM      Result Value Ref Range Status   Specimen Description BLOOD RIGHT HAND   Final   Special Requests BOTTLES DRAWN AEROBIC ONLY 10CC   Final   Culture  Setup Time     Final   Value: 05/13/2014 00:23     Performed at [facility]   Culture     Final   Value: NO GROWTH 5 DAYS     Performed at [facility]   Report Status 05/19/2014  FINAL   Final  CSF CULTURE     Status: None   Collection Time    05/14/14  3:04 PM      Result Value Ref Range Status   Specimen Description CSF   Final   Special Requests NONE   Final   Gram Stain     Final   Value: WBC PRESENT, PREDOMINANTLY MONONUCLEAR     NO ORGANISMS SEEN     CYTOSPIN Performed at [facility]     Performed at [facility]   Culture     Final   Value: NO GROWTH 3 DAYS     Performed at [facility]   Report Status 05/18/2014 FINAL   Final  GRAM STAIN     Status: None   Collection Time    05/14/14  3:04 PM      Result Value Ref Range Status   Specimen Description CSF  Final   Special Requests NONE   Final   Gram Stain     Final   Value: CYTOSPIN PREP     WBC PRESENT, PREDOMINANTLY MONONUCLEAR     NO ORGANISMS SEEN   Report Status 05/14/2014 FINAL   Final  M. TUBERCULOSIS COMPLEX BY PCR     Status: None   Collection Time    05/15/14  4:10 PM      Result Value Ref Range Status   M. tuberculosis, Direct Not detected  Not detected Final   Comment: (NOTE)     This test(s) was developed and its performance characteristics      have been determined by [employer],      [place], [place]. Performance characteristics refer to the analytical      performance of the test.   Source (MTBPCR) Right Pleural Fluid   Corrected   Comment: Performed at [facility]     CORRECTED ON 08/14 AT 0420: PREVIOUSLY REPORTED AS PLEURAL RIGHT FLUID  AFB CULTURE WITH SMEAR     Status: None   Collection Time    05/15/14  4:10 PM      Result Value Ref Range Status   Specimen Description PLEURAL RIGHT FLUID   Final   Special Requests NONE   Final   Acid Fast Smear     Final   Value: NO ACID FAST BACILLI SEEN     Performed at [facility]   Culture     Final   Value: CULTURE WILL BE EXAMINED FOR 6 WEEKS BEFORE ISSUING A FINAL REPORT     Performed at [facility]   Report Status PENDING   Incomplete  FUNGUS  CULTURE W SMEAR     Status: None   Collection Time    05/15/14  4:10 PM      Result Value Ref Range Status   Specimen Description PLEURAL RIGHT   Final   Special Requests NONE   Final   Fungal Smear     Final   Value: NO YEAST OR FUNGAL ELEMENTS SEEN     Performed at [facility]   Culture     Final   Value: CULTURE IN PROGRESS FOR FOUR WEEKS     Performed at [facility]   Report Status PENDING   Incomplete  BODY FLUID CULTURE     Status: None   Collection Time    05/15/14  4:10 PM      Result Value Ref Range Status   Specimen Description PLEURAL RIGHT FLUID   Final   Special Requests NONE   Final   Gram Stain     Final   Value: MODERATE WBC PRESENT,BOTH PMN AND MONONUCLEAR     NO ORGANISMS SEEN     Performed at [facility]   Culture     Final   Value: NO GROWTH 3 DAYS     Performed at [facility]   Report Status 05/19/2014 FINAL   Final  SURGICAL PCR SCREEN     Status: None   Collection Time    05/19/14 11:34 PM      Result Value Ref Range Status   MRSA, PCR NEGATIVE  NEGATIVE Final   Staphylococcus aureus NEGATIVE  NEGATIVE Final   Comment:            The Xpert SA Assay (FDA     approved for NASAL specimens     in patients over 21  years of age),     is one component of     a comprehensive surveillance     program.  Test performance has     been validated by [employer] for patients greater     than or equal to 18 year old.     It is not intended     to diagnose infection nor to     guide or monitor treatment.     BRIEF HOSPITAL COURSE:  Right Pleural Effusion with possible ventriculopleural shunt infection  - Patient was admitted with concerns for ventriculopleural shunt malfunction and subsequent infection giving her clinical symptoms of low-grade fever and headache. Patient was admitted and monitored off antibiotics initially, underwent thoracocentesis 8/5 and 8/8, fluid consistent with exudative pathology, all cultures  negative so far. Patient also underwent shunt tap which had a benign CSF findings.Patient was seen in consultation by pulmonary critical care, infectious disease and neurosurgery, current consensus was for possible VP shunt infection, as a result patient was empirically started on vancomycin, Rocephin and Flagyl on 8/9. Patient underwent shunt revision on 8/13, doing well post operative, ID recommending to stop Rocephin and Flagyl, and to continue with IV Vanco for one more week. Placed PICC line, patient will be discharged home, and will receive one more week of Vancomycin as outpatient. She will follow up with the below noted MD's on discharge.She is currently doing well, with resolution of her headache, fever, has no complaints and is requesting discharge. Her pleural fluid cultures will need to be followed till final, she will also need a repeat CXR in 4-6 weeks time to assess if pleural effusion has completely resolved.    TODAY-DAY OF DISCHARGE:  Subjective:   [name] today has no headache,no chest abdominal pain,no new weakness tingling or numbness, feels much better wants to go home today.   Objective:   Blood pressure 103/56, pulse 75, temperature 98 F (36.7 C), temperature source Oral, resp. rate 16, height 5\' 3"  (1.6 m), weight 68.7 kg (151 lb 7.3 oz), last menstrual period 04/27/2014, SpO2 98.00%. No intake or output data in the 24 hours ending 05/22/14 0936 Filed Weights   05/12/14 2052 05/20/14 1149  Weight: 65 kg (143 lb 4.8 oz) 68.7 kg (151 lb 7.3 oz)    Exam Awake Alert, Oriented *3, No new F.N deficits, Normal affect [place].AT,PERRAL Supple Neck,No JVD, No cervical lymphadenopathy appriciated.  Symmetrical Chest wall movement, Good air movement bilaterally, CTAB RRR,No Gallops,Rubs or new Murmurs, No Parasternal Heave +ve B.Sounds, Abd Soft, Non tender, No organomegaly appriciated, No rebound -guarding or rigidity. No Cyanosis, Clubbing or edema, No new Rash or  bruise  DISCHARGE CONDITION: Stable  DISPOSITION: Home with home health services  DISCHARGE INSTRUCTIONS:    Activity:  As tolerated  Diet recommendation: Regular Diet  Discharge Instructions   Call MD for:  redness, tenderness, or signs of infection (pain, swelling, redness, odor or green/yellow discharge around incision site)    Complete by:  As directed      Call MD for:  temperature >100.4    Complete by:  As directed      Diet general    Complete by:  As directed      Increase activity slowly    Complete by:  As directed            Follow-up Information   Follow up with [facility]    . (Go to the  clinic on either Tuesday or Wednesday of this week at 09;00 (walk-in) and CM has called ahead and Clinic will be expecting you.)    Contact information:   [address] [place] [address] [phone]      Follow up with [facility]. (for home health nurse at discharg for home IV antibiotic)    Contact information:   [address] [place] [address] [phone]       Follow up with Cristi LoronJENKINS,JEFFREY D, MD. Schedule an appointment as soon as possible for a visit in 10 days.   Specialty:  Neurosurgery   Contact information:   [address] [place] [address] [phone]       Schedule an appointment as soon as possible for a visit with Adolph PollackOSENBOWER,TODD J, MD. (As needed)    Specialty:  General Surgery   Contact information:   [address] [place] [address] [phone]       Follow up with Cliffton AstersJohn Campbell, MD. Schedule an appointment as soon as possible for a visit in 2 weeks.   Specialty:  Infectious Diseases   Contact information:   [address] [place] [address] [phone]         Total Time spent on discharge equals 45 minutes.  SignedJeoffrey Massed: Constance Hackenberg 05/22/2014 9:36 AM  **Disclaimer: This note may have been dictated  with voice recognition software. Similar sounding words can inadvertently be transcribed and this note may contain transcription errors which may not have been corrected upon publication of note.**

## 2014-05-22 NOTE — Progress Notes (Signed)
ANTIBIOTIC CONSULT NOTE - Follow-up  Pharmacy Consult for Vancomycin Indication: R/o pleural space/VP shunt infection  No Known Allergies  Patient Measurements: Height: 5\' 3"  (160 cm) Weight: 151 lb 7.3 oz (68.7 kg) IBW/kg (Calculated) : 52.4  Vital Signs:   Intake/Output from previous day:   Intake/Output from this shift:    Labs:  Recent Labs  05/21/14 0308  WBC 16.2*  HGB 12.4  PLT 340  CREATININE 0.57   Estimated Creatinine Clearance: 86.9 ml/min (by C-G formula based on Cr of 0.57).  Recent Labs  05/21/14 2220  VANCOTROUGH 15.5     Microbiology: Recent Results (from the past 720 hour(s))  BODY FLUID CULTURE     Status: None   Collection Time    05/12/14  7:50 PM      Result Value Ref Range Status   Specimen Description PLEURAL FLUID RIGHT   Final   Special Requests NONE   Final   Gram Stain     Final   Value: RARE WBC PRESENT,BOTH PMN AND MONONUCLEAR     NO ORGANISMS SEEN     Performed at [facility]   Culture     Final   Value: NO GROWTH 3 DAYS     Performed at [facility]   Report Status 05/16/2014 FINAL   Final  AFB CULTURE WITH SMEAR     Status: None   Collection Time    05/12/14  7:50 PM      Result Value Ref Range Status   Specimen Description PLEURAL FLUID RIGHT   Final   Special Requests NONE   Final   Acid Fast Smear     Final   Value: NO ACID FAST BACILLI SEEN     Performed at [facility]   Culture     Final   Value: CULTURE WILL BE EXAMINED FOR 6 WEEKS BEFORE ISSUING A FINAL REPORT     Performed at [facility]   Report Status PENDING   Incomplete  FUNGUS CULTURE W SMEAR     Status: None   Collection Time    05/12/14  7:50 PM      Result Value Ref Range Status   Specimen Description PLEURAL FLUID RIGHT   Final   Special Requests NONE   Final   Fungal Smear     Final   Value: NO YEAST OR FUNGAL ELEMENTS SEEN     Performed at [facility]   Culture     Final   Value: CULTURE IN  PROGRESS FOR FOUR WEEKS     Performed at [facility]   Report Status PENDING   Incomplete  CULTURE, BLOOD (ROUTINE X 2)     Status: None   Collection Time    05/12/14  8:15 PM      Result Value Ref Range Status   Specimen Description BLOOD RIGHT ARM   Final   Special Requests BOTTLES DRAWN AEROBIC AND ANAEROBIC [facility]   Final   Culture  Setup Time     Final   Value: 05/13/2014 00:24     Performed at [facility]   Culture     Final   Value: NO GROWTH 5 DAYS     Performed at [facility]   Report Status 05/19/2014 FINAL   Final  CULTURE, BLOOD (ROUTINE X 2)     Status: None   Collection Time    05/12/14  8:20 PM      Result  Value Ref Range Status   Specimen Description BLOOD RIGHT HAND   Final   Special Requests BOTTLES DRAWN AEROBIC ONLY 10CC   Final   Culture  Setup Time     Final   Value: 05/13/2014 00:23     [facility]   Culture     Final   Value: NO GROWTH 5 DAYS     [facility]   Report Status 05/19/2014 FINAL   Final  CSF CULTURE     Status: None   Collection Time    05/14/14  3:04 PM      Result Value Ref Range Status   Specimen Description CSF   Final   Special Requests NONE   Final   Gram Stain     Final   Value: WBC PRESENT, PREDOMINANTLY MONONUCLEAR     NO ORGANISMS SEEN     CY[facility]OSPIN [facility]     [facility]   Culture     Final   Value: NO GROWTH 3 DAYS     [facility]formed at [facility]   Report Status 05/18/2014 FINAL   Final  GRAM STAIN     Status: None   Collection Time    05/14/14  3:04 PM      Result Value Ref Range Status   Specimen Description CSF   Final   Special Requests NONE   Final   Gram Stain     Final   Value: CYTOSPIN PREP     WBC PRESENT, PREDOMINANTLY MONONUCLEAR     NO ORGANISMS SEEN   Report Status 05/14/2014 FINAL   Final  M. TUBERCULOSIS COMPLEX BY PCR     Status: None   Collection Time    05/15/14  4:10 PM       Result Value Ref Range Status   M. tuberculosis, Direct Not detected  Not detected Final   Comment: (NOTE)     This test(s) was developed and its performance characteristics      have been determined by [facility],      [place], [place]. Performance characteristics refer to the analytical      performance of the test.   Source (MTBPCR) Right Pleural Fluid   Corrected   Comment: Performed at [facility]     CORRECTED ON 08/14 AT 0420: PREVIOUSLY REPORTED AS PLEURAL RIGHT FLUID  AFB CULTURE WITH SMEAR     Status: None   Collection Time    05/15/14  4:10 PM      Result Value Ref Range Status   Specimen Description PLEURAL RIGHT FLUID   Final   Special Requests NONE   Final   Acid Fast Smear     Final   Value: NO ACID FAST BACILLI SEEN     [facility]formed at [facility]   Culture     Final   Value: CULTURE WILL BE EXAMINED FOR 6 WEEKS BEFORE ISSUING A FINAL REPORT     [facility]formed at [facility]   Report Status PENDING   Incomplete  FUNGUS CULTURE W SMEAR     Status: None   Collection Time    05/15/14  4:10 PM      Result Value Ref Range Status   Specimen Description PLEURAL RIGHT   Final   Special Requests NONE   Final   Fungal Smear     Final   Value: NO YEAST OR FUNGAL  ELEMENTS SEEN     Performed at [facility]   Culture     Final   Value: CULTURE IN PROGRESS FOR FOUR WEEKS     Performed at [facility]   Report Status PENDING   Incomplete  BODY FLUID CULTURE     Status: None   Collection Time    05/15/14  4:10 PM      Result Value Ref Range Status   Specimen Description PLEURAL RIGHT FLUID   Final   Special Requests NONE   Final   Gram Stain     Final   Value: MODERATE WBC PRESENT,BOTH PMN AND MONONUCLEAR     NO ORGANISMS SEEN     Performed at [facility]   Culture     Final   Value: NO GROWTH 3 DAYS     Performed at [facility]   Report Status 05/19/2014 FINAL   Final  SURGICAL PCR SCREEN      Status: None   Collection Time    05/19/14 11:34 PM      Result Value Ref Range Status   MRSA, PCR NEGATIVE  NEGATIVE Final   Staphylococcus aureus NEGATIVE  NEGATIVE Final   Comment:            The Xpert SA Assay (FDA     approved for NASAL specimens     in patients over 64 years of age),     is one component of     a comprehensive surveillance     program.  Test performance has     been validated by [facility] for patients greater     than or equal to 54 year old.     It is not intended     to diagnose infection nor to     guide or monitor treatment.   Assessment: 47 YOF with history of hydrocephalus with ventriculopleural shunt (revised in Feb 2015). Pt continues on vancomycin (Day #6) for possible shunt infection. ID following and recommending IV Vanc for another week.  New VP shunt placed 8/13.  8/11 Vancomycin trough 8.8 mcg/ml (subtherapeutic) on 750mg  IV q8h.  8/14 Vancomycin trough 15.60mcg/ml(therapeutic) on 1250mg  q8 hours  8/5 Bld x2>neg 8/5 R plueral fluid> neg 8/8 R pleural fluid >ngtd 8/7 CSF culture >neg 8/8 AFB culture > 8/8 Fungus culture > 8/9 TB neg  Goal of Therapy:  Vancomycin trough level 15-20 mcg/ml  Plan:  1. Continue Vancomycin to 1250 mg IV every 8 hours 2. Will continue to follow renal function, culture results, LOT, and antibiotic plans  3. Will consider repeating level next week  Sheppard Coil PharmD., BCPS Clinical Pharmacist Pager [phone] 05/22/2014 12:09 AM

## 2014-06-07 LAB — FUNGUS CULTURE W SMEAR: Fungal Smear: NONE SEEN

## 2014-06-12 LAB — FUNGUS CULTURE W SMEAR: Fungal Smear: NONE SEEN

## 2014-06-24 LAB — AFB CULTURE WITH SMEAR (NOT AT ARMC): ACID FAST SMEAR: NONE SEEN

## 2014-06-27 LAB — AFB CULTURE WITH SMEAR (NOT AT ARMC): Acid Fast Smear: NONE SEEN

## 2015-01-27 ENCOUNTER — Ambulatory Visit (INDEPENDENT_AMBULATORY_CARE_PROVIDER_SITE_OTHER)
Admission: RE | Admit: 2015-01-27 | Discharge: 2015-01-27 | Disposition: A | Discharge status: Home / Self Care | Payer: Medicaid Other | Source: Ambulatory Visit | Attending: Internal Medicine | Admitting: Internal Medicine

## 2015-01-27 ENCOUNTER — Encounter: Payer: Self-pay | Admitting: Internal Medicine

## 2015-01-27 ENCOUNTER — Ambulatory Visit (INDEPENDENT_AMBULATORY_CARE_PROVIDER_SITE_OTHER): Payer: Self-pay | Admitting: Internal Medicine

## 2015-01-27 VITALS — BP 110/70 | HR 82 | Ht 63.5 in | Wt 155.2 lb

## 2015-01-27 DIAGNOSIS — R05 Cough: Secondary | ICD-10-CM

## 2015-01-27 DIAGNOSIS — R059 Cough, unspecified: Secondary | ICD-10-CM

## 2015-01-27 MED ORDER — TRAMADOL HCL 50 MG PO TABS: ORAL_TABLET | ORAL | Status: DC

## 2015-01-27 MED ORDER — PREDNISONE 10 MG PO TABS: ORAL_TABLET | ORAL | Status: DC

## 2015-01-27 NOTE — Patient Instructions (Addendum)
The key to effective treatment for your cough is eliminating the non-stop cycle of cough you're stuck in long enough to let your airway heal completely and then see if there is anything still making you cough once you stop the cough suppression, but this should take no more than 5 days to figure out  First take delsym two tsp every 12 hours and supplement if needed with  tramadol 50 mg up to  1 every 4 hours to suppress the urge to cough at all or even clear your throat. Swallowing water or using ice chips/non mint and menthol containing candies (such as lifesavers or sugarless jolly ranchers) are also effective.  You should rest your voice and avoid activities that you know make you cough.  Once you have eliminated the cough for 3 straight days try reducing the tramadol first,  then the delsym as tolerated.    Prednisone 10 mg take  4 each am x 2 days,   2 each am x 2 days,  1 each am x 2 days and stop (this is to eliminate allergies and inflammation from coughing)  Prilosec OTC 20  before first meal of the day and Pepcid 20 mg one bedtime  until cough is completely gone for at least a week without the need for cough suppression  GERD (REFLUX)  is an extremely common cause of respiratory symptoms, many times with no significant heartburn at all.    It can be treated with medication, but also with lifestyle changes including avoidance of late meals, excessive alcohol, smoking cessation, and avoid fatty foods, chocolate, peppermint, colas, red wine, and acidic juices such as orange juice.  NO MINT OR MENTHOL PRODUCTS SO NO COUGH DROPS  USE HARD CANDY INSTEAD (jolley ranchers or Stover's or Lifesavers (all available in sugarless versions) NO OIL BASED VITAMINS - use powdered substitutes.  Please remember to go to the  x-ray department downstairs for your tests - we will call you with the results when they are available.     If not better in 2 weeks return to clinic

## 2015-01-27 NOTE — Progress Notes (Signed)
   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 Johns Consult    Self referral. Pt c/o cough x 4 wks- non prod.   Cough is day > noct, dry > wet Onset was abrupt but s obvious URI/ infection symptoms   No obvious patterns in day to day or daytime variabilty or assoc sob  or cp or chest tightness, subjective wheeze overt sinus or hb symptoms. No unusual exp hx or h/o childhood pna/ asthma or knowledge of premature birth.  Sleeping ok without nocturnal  or early am exacerbation  of respiratory  c/o's or need for noct saba. Also denies any obvious fluctuation of symptoms with weather or environmental changes or other aggravating or alleviating factors except as outlined above   Current Medications, Allergies, Complete Past Medical History, Past Surgical History, Family History, and Social History were reviewed in [facility] electronic medical record.            Review of Systems  Constitutional: Negative for fever, chills and unexpected weight change.  HENT: Negative for congestion, dental problem, ear pain, nosebleeds, postnasal drip, rhinorrhea, sinus pressure, sneezing, sore throat, trouble swallowing and voice change.   Eyes: Negative for visual disturbance.  Respiratory: Positive for cough. Negative for choking and shortness of breath.   Cardiovascular: Negative for chest pain and leg swelling.  Gastrointestinal: Negative for vomiting, abdominal pain and diarrhea.  Genitourinary: Negative for difficulty urinating.  Musculoskeletal: Negative for  arthralgias.  Skin: Negative for rash.  Neurological: Negative for tremors, syncope and headaches.  Hematological: Does not bruise/bleed easily.       Objective:   Physical Exam  Amb wf occ dry cough   Wt Readings from Last 3 Encounters:  01/27/15 155 lb 3.2 oz (70.398 kg)  05/20/14 151 lb 7.3 oz (68.7 kg)  05/11/14 148 lb 12.8 oz (67.495 kg)    Vital signs reviewed  HEENT: nl dentition, turbinates, and orophanx. Nl external ear canals without cough reflex   NECK :  without JVD/Nodes/TM/ nl carotid upstrokes bilaterally   LUNGS: no acc muscle use, clear to A and P bilaterally without cough on insp or exp maneuvers   CV:  RRR  no s3 or murmur or increase in P2, no edema   ABD:  soft and nontender with nl excursion in the supine position. No bruits or organomegaly, bowel sounds nl  MS:  warm without deformities, calf tenderness, cyanosis or clubbing  SKIN: warm and dry without lesions    NEURO:  alert, approp, no deficits    CXR PA and Lateral:   01/27/2015 :     I personally reviewed images and agree with radiology impression as follows:    Heart size and vascular pattern are normal. No infiltrate or effusion. Mild elevation of the right diaphragm. VP shunt tubing noted. It appears unchanged.      Assessment & Plan:

## 2015-01-27 NOTE — Assessment & Plan Note (Signed)

## 2015-01-28 NOTE — Progress Notes (Signed)
Quick Note:  LMTCB ______ 

## 2015-01-31 NOTE — Progress Notes (Signed)
Quick Note:  LMTCB ______ 

## 2015-02-04 ENCOUNTER — Telehealth: Payer: Self-pay | Admitting: Internal Medicine

## 2015-02-04 MED ORDER — PREDNISONE 10 MG PO TABS: ORAL_TABLET | ORAL | Status: DC

## 2015-02-04 NOTE — Telephone Encounter (Signed)
Patients husband given results.  He thinks that patient needs more Prednisone.  She is still coughing a lot and husband says that the prednisone worked, but he doesn't think that she had enough.  To Dr. Sherene SiresWert

## 2015-02-04 NOTE — Telephone Encounter (Signed)
Pt aware that Pred taper sent to pharmacy Needs OV - MW is 100% booked all next week and so is TP  Please advise Dr Sherene SiresWert if okay to double book or if we can schedule into the week of May 9th

## 2015-02-04 NOTE — Telephone Encounter (Signed)
Week of may 9 fine

## 2015-02-04 NOTE — Telephone Encounter (Signed)
Prednisone 10 mg take  4 each am x 2 days,   2 each am x 2 days,  1 each am x 2 days and stop but needs f/u ov by end of next week with all meds in hand

## 2015-02-04 NOTE — Telephone Encounter (Signed)
Per Dr. Sherene SiresWert, okay to schedule patient week of May 9th.   Left message for patient to call back to schedule appointment.  Does not need to speak to nurse, just schedule appointment.  Thanks.

## 2015-02-07 NOTE — Telephone Encounter (Signed)
Pt returned call - [phone]

## 2015-02-07 NOTE — Telephone Encounter (Signed)
LMTCB x2  

## 2015-02-07 NOTE — Telephone Encounter (Signed)
lmomtcb x1 

## 2015-02-08 NOTE — Telephone Encounter (Signed)
lmtcb for pt.  Pt only needs to be scheduled sometime next week with MW.

## 2015-02-08 NOTE — Telephone Encounter (Signed)
Pt called back and then once transferred she hung up. Called back and lmtcb

## 2015-02-09 NOTE — Telephone Encounter (Signed)
Called and spoke to pt. Appt made with MW on 5/11. Pt verbalized understanding and denied any further questions or concerns at this time.

## 2015-02-16 ENCOUNTER — Encounter: Payer: Self-pay | Admitting: Internal Medicine

## 2015-02-16 ENCOUNTER — Ambulatory Visit (INDEPENDENT_AMBULATORY_CARE_PROVIDER_SITE_OTHER): Payer: Self-pay | Admitting: Internal Medicine

## 2015-02-16 VITALS — BP 108/76 | HR 74 | Ht 63.5 in | Wt 153.0 lb

## 2015-02-16 DIAGNOSIS — R05 Cough: Secondary | ICD-10-CM

## 2015-02-16 DIAGNOSIS — R059 Cough, unspecified: Secondary | ICD-10-CM

## 2015-02-16 MED ORDER — OMEPRAZOLE MAGNESIUM 20 MG PO TBEC: 20.0000 mg | DELAYED_RELEASE_TABLET | Freq: Every day | ORAL | Status: DC

## 2015-02-16 MED ORDER — TRAMADOL HCL 50 MG PO TABS: ORAL_TABLET | ORAL | Status: DC

## 2015-02-16 MED ORDER — FAMOTIDINE 20 MG PO TABS: ORAL_TABLET | ORAL | Status: DC

## 2015-02-16 MED ORDER — PREDNISONE 10 MG PO TABS: ORAL_TABLET | ORAL | Status: DC

## 2015-02-16 NOTE — Patient Instructions (Addendum)
The key to effective treatment for your cough is eliminating the non-stop cycle of cough you're stuck in long enough to let your airway heal completely and then see if there is anything still making you cough once you stop the cough suppression, but this should take no more than 5 days to figure out  First take delsym two tsp every 12 hours and supplement if needed with  tramadol 50 mg up to  1 every 4 hours to suppress the urge to cough at all or even clear your throat. Swallowing water or using ice chips/non mint and menthol containing candies (such as lifesavers or sugarless jolly ranchers) are also effective.  You should rest your voice and avoid activities that you know make you cough.  Once you have eliminated the cough for 3 straight days try reducing the tramadol first,  then the delsym as tolerated.    Prednisone 10 mg take  4 each am x 2 days,   2 each am x 2 days,  1 each am x 2 days and stop (this is to eliminate allergies and inflammation from coughing)  Prilosec OTC 20  before first meal of the day and Pepcid 20 mg one bedtime  until cough is completely gone for at least 4 weeks without the need for cough suppression  For drainage take chlortrimeton (chlorpheniramine) 4 mg every 4 hours available over the counter (may cause drowsiness so take 1-2 at bedtime)   GERD (REFLUX)  is an extremely common cause of respiratory symptoms, many times with no significant heartburn at all.    It can be treated with medication, but also with lifestyle changes including avoidance of late meals, excessive alcohol, smoking cessation, and avoid fatty foods, chocolate, peppermint, colas, red wine, and acidic juices such as orange juice.  NO MINT OR MENTHOL PRODUCTS SO NO COUGH DROPS   La chiave per un trattamento efficace per la tosse sta eliminando il ciclo non-stop di tosse sei bloccato in [place] a lungo per lasciare il vostro vie respiratorie guarire completamente e poi vedere se c' qualcosa  ancora facendo si tossisce una volta che si interrompe la soppressione della tosse, ma questo dovrebbe prendere non pi di 5 giorni per capire  In [employer]GM MIRAGEprimo luogo prendere delsym due cucchiaini ogni 12 ore e integrare, se necessario, con tramadolo 50 mg fino a 1 ogni 4 ore per [employer]re la voglia di tosse del tutto o anche chiara la gola. L'ingestione di acqua o con cubetti di ghiaccio / non menta e mentolo contenente caramelle (come bagnini o allevatori senza zucchero jolly) sono anche efficaci. Si dovrebbe riposare la voce e di evitare attivit che si sa fanno si tossisce.  Dopo aver eliminato la tosse per 3 giorni di fila provare a ridurre Medical sales representativeil tramadolo, poi il delsym come tollerato.  Prednisone 10 mg prendono 4 ciascuno pm x 2 giorni, 2 ciascuno am x 2 giorni, 1 ogni am x 2 giorni e Stop (questo  quello di eliminare le allergie e l'infiammazione da colpi di tosse)  Prilosec OTC 20 prima del primo pasto della giornata e Pepcid 20 mg una coricarsi fino a quando la tosse  completamente sparito per Levi Straussalmeno 4 settimane senza la necessit di soppressione della tosse  Per il drenaggio prendere chlortrimeton (clorfeniramina) 4 mg ogni 4 ore disponibili al banco (pu provocare sonnolenza in [place] da prendere 1-2 prima di coricarsi)  GERD (REFLUSSO)  una causa molto comune di sintomi respiratori, molte volte con nessun significativo bruciore di stomaco a tutti.  Si pu essere trattata con i farmaci, ma anche con i cambiamenti dello stile [address], tra cui evitare pasti in ritardo, Colgateeccessivo di alcol, smettere di [place]are, e di evitare cibi [place], [place]ato, menta [place], [place], vino rosso, e succhi acidi come il succo d'arancia. NO menta o MENTOLO MERCI in modo che nessun gocce per la tosse  Uso duro CANDY INVECE (allevatori Jolley o Stover del o Lifesavers (tutti disponibili nelle versioni senza zucchero) NO VITAMINE a base di olio - utilizzano sostituti in [place].   Se non meglio in 2 settimane  tornare alla clinica con tutti i farmaci attivi in [place]    USE HARD CANDY INSTEAD (jolley ranchers or Stover's or Lifesavers (all available in sugarless versions) NO OIL BASED VITAMINS - use powdered substitutes.       If not better in 2 weeks return to clinic with all active medications in hand

## 2015-02-16 NOTE — Progress Notes (Signed)
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 Pulmonary Consult    Self referral. Pt c/o cough x 4 wks- non prod.   Cough is day > noct, dry > wet Onset was abrupt but s obvious URI/ infection symptoms  rec First take delsym two tsp every 12 hours and supplement if needed with  tramadol 50 mg up to  1 every 4 hours to suppress the urge to cough at all or even clear your throat.    Prednisone 10 mg take  4 each am x 2 days,   2 each am x 2 days,  1 each am x 2 days and stop (this is to eliminate allergies and inflammation from coughing) Prilosec OTC 20  before first meal of the day and Pepcid 20 mg one bedtime   GERD  Diet  Please remember to go to the  x-ray department    02/16/2015 f/u ov/Wert re: recurrent cough  Chief Complaint  Patient presents with  . Follow-up    Pt states she went 3 days in a row with no cough, but then started coughing again yesterday. Cough is not too bothesome today.  very difficult interview with pt and husband not clear what was taken   No obvious day to day or daytime variabilty or assoc sob or cp or chest tightness, subjective wheeze overt sinus or hb symptoms. No unusual exp hx or h/o childhood pna/ asthma or knowledge of premature birth.  Sleeping ok without nocturnal  or early am exacerbation  of respiratory  c/o's or need for noct saba. Also denies any obvious fluctuation of symptoms with weather or environmental changes or other aggravating or  alleviating factors except as outlined above   Current Medications, Allergies, Complete Past Medical History, Past Surgical History, Family History, and Social History were reviewed in [facility] electronic medical record.  ROS  The following are not active complaints unless bolded sore throat, dysphagia, dental problems, itching, sneezing,  nasal congestion or excess/ purulent secretions, ear ache,   fever, chills, sweats, unintended wt loss, pleuritic or exertional cp, hemoptysis,  orthopnea pnd or leg swelling, presyncope, palpitations, heartburn, abdominal pain, anorexia, nausea, vomiting, diarrhea  or change in bowel or urinary habits, change in stools or urine, dysuria,hematuria,  rash, arthralgias, visual complaints, headache, numbness weakness or ataxia or problems with walking or coordination,  change in mood/affect or memory.          Objective:   Physical Exam  Amb wf occ dry cough   02/16/15            153  Wt Readings from Last 3 Encounters:  01/27/15 155 lb 3.2 oz (70.398 kg)  05/20/14 151 lb 7.3 oz (68.7 kg)  05/11/14 148 lb 12.8 oz (67.495 kg)    Vital signs reviewed  HEENT: nl dentition, turbinates, and orophanx. Nl external ear canals without cough reflex   NECK :  without JVD/Nodes/TM/ nl carotid upstrokes bilaterally   LUNGS: no acc muscle use, clear to A and P bilaterally without cough on insp or exp maneuvers   CV:  RRR  no s3 or murmur or increase in P2, no edema   ABD:  soft and nontender with nl excursion in the supine position. No bruits or organomegaly, bowel sounds nl  MS:  warm without deformities, calf tenderness, cyanosis or clubbing  SKIN: warm and dry without lesions    NEURO:  alert, approp, no deficits    CXR PA and Lateral:   01/27/2015 :     I personally reviewed images and agree with radiology impression as follows:    Heart size and vascular pattern are normal. No infiltrate or effusion. Mild elevation of the right diaphragm. VP  shunt tubing noted. It appears unchanged.      Assessment & Plan:

## 2015-02-17 ENCOUNTER — Encounter: Payer: Self-pay | Admitting: Internal Medicine

## 2015-02-17 NOTE — Assessment & Plan Note (Signed)
Not clear at all she followed the instructions by discussion today with her and husband  The standardized cough guidelines published in [employer] by [employer] in 2006 are still the best available and consist of a multiple step process (up to 12!) , not a single office visit,  and are intended  to address this problem logically,  with an alogrithm dependent on response to empiric treatment at  each progressive step  to determine a specific diagnosis with  minimal addtional testing needed. Therefore if adherence is an issue or can't be accurately verified,  it's very unlikely the standard evaluation and treatment will be successful here.    Furthermore, response to therapy (other than acute cough suppression, which should only be used short term with avoidance of narcotic containing cough syrups if possible), can be a gradual process for which the patient may perceive immediate benefit.  Unlike going to an eye doctor where the best perscription is almost always the first one and is immediately effective, this is almost never the case in the management of chronic cough syndromes. Therefore the patient needs to commit up front to consistently adhere to recommendations  for up to 6 weeks of therapy directed at the likely underlying problem(s) before the response can be reasonably evaluated.   rec repeat step one but this time use a trust but verify approach.  See instructions (written in both english and [place]) for specific recommendations which were reviewed directly with the patient who was given a copy with highlighter outlining the key components.

## 2015-05-26 ENCOUNTER — Ambulatory Visit (INDEPENDENT_AMBULATORY_CARE_PROVIDER_SITE_OTHER): Payer: Self-pay | Admitting: Emergency Medicine

## 2015-05-26 VITALS — BP 120/80 | HR 88 | Temp 98.2°F | Resp 16 | Ht 64.0 in | Wt 151.0 lb

## 2015-05-26 DIAGNOSIS — R040 Epistaxis: Secondary | ICD-10-CM

## 2015-05-26 LAB — POCT CBC
Granulocyte percent: 61 %G (ref 37–80)
HEMATOCRIT: 45 % (ref 37.7–47.9)
HEMOGLOBIN: 14.3 g/dL (ref 12.2–16.2)
Lymph, poc: 2 (ref 0.6–3.4)
MCH: 27.3 pg (ref 27–31.2)
MCHC: 31.7 g/dL — AB (ref 31.8–35.4)
MCV: 86 fL (ref 80–97)
MID (cbc): 0.5 (ref 0–0.9)
MPV: 8.7 fL (ref 0–99.8)
POC GRANULOCYTE: 4 (ref 2–6.9)
POC LYMPH %: 30.8 % (ref 10–50)
POC MID %: 8.2 %M (ref 0–12)
Platelet Count, POC: 212 10*3/uL (ref 142–424)
RBC: 5.24 M/uL (ref 4.04–5.48)
RDW, POC: 13.3 %
WBC: 6.6 10*3/uL (ref 4.6–10.2)

## 2015-05-26 NOTE — Progress Notes (Signed)
Subjective:  Patient ID: [name], female    DOB: 08-07-1976  Age: 39 y.o. MRN: [id number]  CC: Epistaxis   HPI [name] presents  patient has a history over the last year or so. She said she's had intermittent nosebleeds on the left side since her VP shunt was placed. She has no history of trauma or antecedent illness she had a two-hour nosebleed today. Other nosebleed earlier in the week. She has no history of blood dyscrasia. Said the nosebleed is spontaneously occurred while she was at work  History [name] has a past medical history of VP (ventriculoperitoneal) shunt status; Chronic headaches; Chronic cough; Pleural effusion on right; and Ventriculopleural shunt status.   She has past surgical history that includes shunt; Shunt revision ventricular-peritoneal (N/A, 12/03/2013); Shunt removal (N/A, 05/20/2014); Ventriculoperitoneal shunt (Right, 05/20/2014); and Laparoscopic revision ventricular-peritoneal (v-p) shunt (N/A, 05/20/2014).   Her  family history includes Diabetes in her father.  She   reports that she has never smoked. She has never used smokeless tobacco. She reports that she does not drink alcohol or use illicit drugs.  Outpatient Prescriptions Prior to Visit  Medication Sig Dispense Refill  . traMADol (ULTRAM) 50 MG tablet 1-2 every 4 hours as needed for cough or pain (Patient not taking: Reported on 05/26/2015) 40 tablet 0  . famotidine (PEPCID) 20 MG tablet One at bedtime    . omeprazole (PRILOSEC OTC) 20 MG tablet Take 1 tablet (20 mg total) by mouth daily.    . predniSONE (DELTASONE) 10 MG tablet Take 4 each am x 2 days, then 2 each am x 2 days, then 1 each am x 2 days and stop 14 tablet 0   No facility-administered medications prior to visit.    Social History   Social History  . Marital Status: Married    Spouse Name: N/A  . Number of Children: N/A  . Years of Education: N/A   Occupational History  . [employer]   Social  History Main Topics  . Smoking status: Never Smoker   . Smokeless tobacco: Never Used  . Alcohol Use: No  . Drug Use: No  . Sexual Activity: Yes    Birth Control/ Protection: IUD   Other Topics Concern  . None   Social History Narrative     Review of Systems  Constitutional: Negative for fever, chills and appetite change.  HENT: Negative for congestion, ear pain, postnasal drip, sinus pressure and sore throat.   Eyes: Negative for pain and redness.  Respiratory: Negative for cough, shortness of breath and wheezing.   Cardiovascular: Negative for leg swelling.  Gastrointestinal: Negative for nausea, vomiting, abdominal pain, diarrhea, constipation and blood in stool.  Endocrine: Negative for polyuria.  Genitourinary: Negative for dysuria, urgency, frequency and flank pain.  Musculoskeletal: Negative for gait problem.  Skin: Negative for rash.  Neurological: Negative for weakness and headaches.  Psychiatric/Behavioral: Negative for confusion and decreased concentration. The patient is not nervous/anxious.     Objective:  BP 120/80 mmHg  Pulse 88  Temp(Src) 98.2 F (36.8 C) (Oral)  Resp 16  Ht 5\' 4"  (1.626 m)  Wt 151 lb (68.493 kg)  BMI 25.91 kg/m2  SpO2 99%  Physical Exam  Constitutional: She is oriented to person, place, and time. She appears well-developed and well-nourished.  HENT:  Head: Normocephalic and atraumatic.  Eyes: Conjunctivae are normal. Pupils are equal, round, and reactive to light.  Pulmonary/Chest: Effort normal.  Musculoskeletal: She exhibits no edema.  Neurological:  She is alert and oriented to person, place, and time.  Skin: Skin is dry.  Psychiatric: She has a normal mood and affect. Her behavior is normal. Thought content normal.      Assessment & Plan:   [name] was seen today for epistaxis.  Diagnoses and all orders for this visit:  Frequent nosebleeds -     Ambulatory referral to ENT -     POCT CBC  Nosebleed   I have  discontinued [name]'s omeprazole, famotidine, and predniSONE. I am also having her maintain her traMADol.  No orders of the defined types were placed in this encounter.   She had a left anterior no nosebleed on the Hesselbach's plexus. I was treated with silver nitrate cautery. A nevus comp showed critical vision with elimination she was instructed not to blow PICC or wipe her nose for 2 days and a follow-up with ENT will be accomplished  Appropriate red flag conditions were discussed with the patient as well as actions that should be taken.  Patient expressed his understanding.  Follow-up: Return if symptoms worsen or fail to improve.  Carmelina Dane, MD   Results for orders placed or performed in visit on 05/26/15  POCT CBC  Result Value Ref Range   WBC 6.6 4.6 - 10.2 K/uL   Lymph, poc 2.0 0.6 - 3.4   POC LYMPH PERCENT 30.8 10 - 50 %L   MID (cbc) 0.5 0 - 0.9   POC MID % 8.2 0 - 12 %M   POC Granulocyte 4.0 2 - 6.9   Granulocyte percent 61.0 37 - 80 %G   RBC 5.24 4.04 - 5.48 M/uL   Hemoglobin 14.3 12.2 - 16.2 g/dL   HCT, POC 13.0 86.5 - 47.9 %   MCV 86.0 80 - 97 fL   MCH, POC 27.3 27 - 31.2 pg   MCHC 31.7 (A) 31.8 - 35.4 g/dL   RDW, POC 78.4 %   Platelet Count, POC 212 142 - 424 K/uL   MPV 8.7 0 - 99.8 fL

## 2015-05-26 NOTE — Patient Instructions (Signed)

## 2015-06-28 ENCOUNTER — Emergency Department (HOSPITAL_COMMUNITY)
Admission: EM | Admit: 2015-06-28 | Discharge: 2015-06-28 | Discharge status: Against Medical Advice | Payer: Self-pay | Attending: Emergency Medicine | Admitting: Emergency Medicine

## 2015-06-28 ENCOUNTER — Encounter (HOSPITAL_COMMUNITY): Payer: Self-pay

## 2015-06-28 DIAGNOSIS — R51 Headache: Secondary | ICD-10-CM | POA: Insufficient documentation

## 2015-06-28 NOTE — ED Notes (Signed)
Onset 30 minutes prior to arrival pt was riding in car and started having pain on right side of head where shunt is and started seeing dots.

## 2015-06-28 NOTE — ED Notes (Signed)
Pt at nurses station with family asking for update on weight time, RN explained to pt and family that they will be moved shortly due to acuity. Pt husband upset and yelling, informed pt family of length of time for all pt. Pt states she is leaving, walking out of the door as this rn is trying to persuade pt to stay. Pt in nad, steady gait.

## 2015-08-20 ENCOUNTER — Encounter (HOSPITAL_COMMUNITY): Payer: Self-pay | Admitting: *Deleted

## 2015-08-20 ENCOUNTER — Inpatient Hospital Stay (HOSPITAL_COMMUNITY)
Admission: EM | Admit: 2015-08-20 | Discharge: 2015-09-06 | DRG: 025 | Disposition: A | Discharge status: Home / Self Care | Payer: Medicaid Other | Attending: Neurosurgery | Admitting: Neurosurgery

## 2015-08-20 ENCOUNTER — Emergency Department (HOSPITAL_COMMUNITY): Payer: Medicaid Other

## 2015-08-20 DIAGNOSIS — Z982 Presence of cerebrospinal fluid drainage device: Secondary | ICD-10-CM

## 2015-08-20 DIAGNOSIS — T85890A Other specified complication of nervous system prosthetic devices, implants and grafts, initial encounter: Secondary | ICD-10-CM | POA: Diagnosis present

## 2015-08-20 DIAGNOSIS — T8509XA Other mechanical complication of ventricular intracranial (communicating) shunt, initial encounter: Secondary | ICD-10-CM | POA: Diagnosis present

## 2015-08-20 DIAGNOSIS — R109 Unspecified abdominal pain: Secondary | ICD-10-CM

## 2015-08-20 DIAGNOSIS — R51 Headache: Secondary | ICD-10-CM | POA: Diagnosis present

## 2015-08-20 DIAGNOSIS — T8501XA Breakdown (mechanical) of ventricular intracranial (communicating) shunt, initial encounter: Secondary | ICD-10-CM | POA: Diagnosis present

## 2015-08-20 DIAGNOSIS — R4182 Altered mental status, unspecified: Secondary | ICD-10-CM

## 2015-08-20 DIAGNOSIS — R4020 Unspecified coma: Secondary | ICD-10-CM | POA: Diagnosis not present

## 2015-08-20 DIAGNOSIS — G911 Obstructive hydrocephalus: Secondary | ICD-10-CM | POA: Diagnosis present

## 2015-08-20 DIAGNOSIS — G919 Hydrocephalus, unspecified: Secondary | ICD-10-CM

## 2015-08-20 LAB — COMPREHENSIVE METABOLIC PANEL
ALT: 17 U/L (ref 14–54)
AST: 18 U/L (ref 15–41)
Albumin: 4 g/dL (ref 3.5–5.0)
Alkaline Phosphatase: 52 U/L (ref 38–126)
Anion gap: 6 (ref 5–15)
BUN: 11 mg/dL (ref 6–20)
CALCIUM: 9.5 mg/dL (ref 8.9–10.3)
CHLORIDE: 112 mmol/L — AB (ref 101–111)
CO2: 25 mmol/L (ref 22–32)
Creatinine, Ser: 0.84 mg/dL (ref 0.44–1.00)
Glucose, Bld: 107 mg/dL — ABNORMAL HIGH (ref 65–99)
Potassium: 4.1 mmol/L (ref 3.5–5.1)
SODIUM: 143 mmol/L (ref 135–145)
TOTAL PROTEIN: 7.1 g/dL (ref 6.5–8.1)
Total Bilirubin: 0.5 mg/dL (ref 0.3–1.2)

## 2015-08-20 LAB — PROTEIN AND GLUCOSE, CSF
GLUCOSE CSF: 67 mg/dL (ref 40–70)
Total  Protein, CSF: 120 mg/dL — ABNORMAL HIGH (ref 15–45)

## 2015-08-20 LAB — CSF CELL COUNT WITH DIFFERENTIAL
RBC COUNT CSF: 10425 /mm3 — AB
WBC CSF: 6 /mm3 — AB (ref 0–5)

## 2015-08-20 LAB — I-STAT CHEM 8, ED
BUN: 13 mg/dL (ref 6–20)
CALCIUM ION: 1.27 mmol/L — AB (ref 1.12–1.23)
CREATININE: 0.8 mg/dL (ref 0.44–1.00)
Chloride: 109 mmol/L (ref 101–111)
GLUCOSE: 100 mg/dL — AB (ref 65–99)
HCT: 49 % — ABNORMAL HIGH (ref 36.0–46.0)
HEMOGLOBIN: 16.7 g/dL — AB (ref 12.0–15.0)
Potassium: 4.1 mmol/L (ref 3.5–5.1)
Sodium: 146 mmol/L — ABNORMAL HIGH (ref 135–145)
TCO2: 23 mmol/L (ref 0–100)

## 2015-08-20 LAB — DIFFERENTIAL
BASOS PCT: 1 %
Basophils Absolute: 0 10*3/uL (ref 0.0–0.1)
EOS PCT: 1 %
Eosinophils Absolute: 0.1 10*3/uL (ref 0.0–0.7)
Lymphocytes Relative: 20 %
Lymphs Abs: 1.3 10*3/uL (ref 0.7–4.0)
MONO ABS: 0.5 10*3/uL (ref 0.1–1.0)
Monocytes Relative: 8 %
Neutro Abs: 4.7 10*3/uL (ref 1.7–7.7)
Neutrophils Relative %: 70 %

## 2015-08-20 LAB — CBC
HCT: 45.9 % (ref 36.0–46.0)
Hemoglobin: 15.5 g/dL — ABNORMAL HIGH (ref 12.0–15.0)
MCH: 29.5 pg (ref 26.0–34.0)
MCHC: 33.8 g/dL (ref 30.0–36.0)
MCV: 87.4 fL (ref 78.0–100.0)
PLATELETS: 205 10*3/uL (ref 150–400)
RBC: 5.25 MIL/uL — ABNORMAL HIGH (ref 3.87–5.11)
RDW: 12.9 % (ref 11.5–15.5)
WBC: 6.6 10*3/uL (ref 4.0–10.5)

## 2015-08-20 LAB — I-STAT TROPONIN, ED: TROPONIN I, POC: 0 ng/mL (ref 0.00–0.08)

## 2015-08-20 LAB — PROTIME-INR
INR: 1.07 (ref 0.00–1.49)
PROTHROMBIN TIME: 14.1 s (ref 11.6–15.2)

## 2015-08-20 LAB — APTT: aPTT: 30 seconds (ref 24–37)

## 2015-08-20 LAB — MRSA PCR SCREENING: MRSA by PCR: NEGATIVE

## 2015-08-20 MED ORDER — HYDROMORPHONE HCL 1 MG/ML IJ SOLN
1.0000 mg | INTRAMUSCULAR | Status: DC | PRN
  Administered 2015-08-24 – 2015-08-26 (×2): 1 mg via INTRAVENOUS
  Filled 2015-08-20 (×2): qty 1

## 2015-08-20 MED ORDER — ACETAMINOPHEN 325 MG PO TABS: 650.0000 mg | ORAL_TABLET | Freq: Four times a day (QID) | ORAL | Status: DC | PRN

## 2015-08-20 MED ORDER — SODIUM CHLORIDE 0.9 % IJ SOLN: 3.0000 mL | INTRAMUSCULAR | Status: DC | PRN

## 2015-08-20 MED ORDER — CEFAZOLIN SODIUM 1-5 GM-% IV SOLN
1.0000 g | Freq: Three times a day (TID) | INTRAVENOUS | Status: DC
  Administered 2015-08-20 – 2015-08-22 (×5): 1 g via INTRAVENOUS
  Filled 2015-08-20 (×7): qty 50

## 2015-08-20 MED ORDER — SODIUM CHLORIDE 0.9 % IV BOLUS (SEPSIS)
1000.0000 mL | Freq: Once | INTRAVENOUS | Status: AC
  Administered 2015-08-20: 1000 mL via INTRAVENOUS

## 2015-08-20 MED ORDER — PROCHLORPERAZINE EDISYLATE 5 MG/ML IJ SOLN
10.0000 mg | Freq: Four times a day (QID) | INTRAMUSCULAR | Status: DC | PRN
  Administered 2015-08-20: 10 mg via INTRAVENOUS
  Filled 2015-08-20 (×2): qty 2

## 2015-08-20 MED ORDER — POTASSIUM CHLORIDE IN NACL 20-0.9 MEQ/L-% IV SOLN
INTRAVENOUS | Status: DC
  Administered 2015-08-21 – 2015-08-22 (×3): via INTRAVENOUS
  Administered 2015-09-05: 1000 mL via INTRAVENOUS
  Administered 2015-09-06: 80 mL/h via INTRAVENOUS
  Filled 2015-08-20 (×39): qty 1000

## 2015-08-20 MED ORDER — MORPHINE SULFATE (PF) 4 MG/ML IV SOLN: 4.0000 mg | INTRAVENOUS | Status: AC

## 2015-08-20 MED ORDER — MIDAZOLAM HCL 2 MG/2ML IJ SOLN: 4.0000 mg | INTRAMUSCULAR | Status: AC

## 2015-08-20 MED ORDER — ACETAMINOPHEN 650 MG RE SUPP: 650.0000 mg | Freq: Four times a day (QID) | RECTAL | Status: DC | PRN

## 2015-08-20 MED ORDER — ONDANSETRON HCL 4 MG PO TABS: 4.0000 mg | ORAL_TABLET | Freq: Four times a day (QID) | ORAL | Status: DC | PRN

## 2015-08-20 MED ORDER — MIDAZOLAM HCL 2 MG/2ML IJ SOLN
INTRAMUSCULAR | Status: AC
  Administered 2015-08-20: 4 mg
  Filled 2015-08-20: qty 4

## 2015-08-20 MED ORDER — MORPHINE SULFATE (PF) 4 MG/ML IV SOLN
INTRAVENOUS | Status: AC
  Administered 2015-08-20: 4 mg
  Filled 2015-08-20: qty 1

## 2015-08-20 MED ORDER — SODIUM CHLORIDE 0.9 % IJ SOLN
3.0000 mL | Freq: Two times a day (BID) | INTRAMUSCULAR | Status: DC
  Administered 2015-08-20 – 2015-08-24 (×9): 3 mL via INTRAVENOUS
  Administered 2015-08-25: 10 mL via INTRAVENOUS
  Administered 2015-08-26 – 2015-08-27 (×4): 3 mL via INTRAVENOUS
  Administered 2015-08-28: 20 mL via INTRAVENOUS
  Administered 2015-08-28: 3 mL via INTRAVENOUS
  Administered 2015-08-29: 10 mL via INTRAVENOUS
  Administered 2015-08-29 – 2015-08-31 (×5): 3 mL via INTRAVENOUS
  Administered 2015-09-01: 10 mL via INTRAVENOUS
  Administered 2015-09-01 – 2015-09-02 (×2): 3 mL via INTRAVENOUS
  Administered 2015-09-02: 10 mL via INTRAVENOUS
  Administered 2015-09-03 – 2015-09-05 (×5): 3 mL via INTRAVENOUS

## 2015-08-20 MED ORDER — ONDANSETRON HCL 4 MG/2ML IJ SOLN: 4.0000 mg | Freq: Four times a day (QID) | INTRAMUSCULAR | Status: DC | PRN

## 2015-08-20 MED ORDER — OXYCODONE HCL 5 MG PO TABS: 5.0000 mg | ORAL_TABLET | ORAL | Status: DC | PRN

## 2015-08-20 MED ORDER — SODIUM CHLORIDE 0.9 % IV SOLN: 250.0000 mL | INTRAVENOUS | Status: DC | PRN

## 2015-08-20 NOTE — ED Notes (Addendum)
Neurosurgery MD at bedside to drain Shunt

## 2015-08-20 NOTE — ED Provider Notes (Signed)
CSN: [id number]     Arrival date & time 08/20/15  1424 History   First MD Initiated Contact with Patient 08/20/15 1603     Chief Complaint  Patient presents with  . Headache     (Consider location/radiation/quality/duration/timing/severity/associated sxs/prior Treatment) HPI Impression presents with her husband who provide history of present illness. Patient history of VP shunt, headache. He states that over the past day she has become increasingly uncomfortable with diffuse headache, decreased energy, increased listlessness. These symptoms are consistent with prior shunt issues. No recent change in medication, diet, activity. The patient herself cannot provide any details of the history of present illness. Level V caveat. Husband denies any recent fever, vomiting, falling, trauma, other notable changes.  Past Medical History  Diagnosis Date  . VP (ventriculoperitoneal) shunt status   . Chronic headaches   . Chronic cough   . 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 Ventriculopleural shunt status    Past Surgical History  Procedure Laterality Date  . Shunt      brain  . Shunt revision ventricular-peritoneal N/A 12/03/2013    Procedure: Revision of Ventricular Peritoneal Shunt;  Surgeon: Cristi Loron, MD;  Location: [facility] NEURO ORS;  Service: Neurosurgery;  Laterality: N/A;  . Shunt removal N/A 05/20/2014    Procedure: shunt removal and placement of laproscopic VP shunt with Dr Abbey Chatters;  Surgeon: Tressie Stalker, MD;  Location: [facility] NEUR[facility]S;  Service: Neurosurgery;  Laterality: N/A;  . Ventriculoperitoneal shunt Right 05/20/2014    Procedure: SHUNT INSERTION VENTRICULAR-PERITONEAL;  Surgeon: Tressie Stalker, MD;  Location: [facility] NEURO ORS;  Service: Neurosurgery;  Laterality: Right;  . Laparoscopic revision ventricular-peritoneal (v-p) shunt N/A 05/20/2014    Procedure: LAPAROSCOPIC REVISION VENTRICULAR-PERITONEAL (V-P) SHUNT;  Surgeon: Adolph Pollack, MD;   Location: [facility] NEURO ORS;  Service: General;  Laterality: N/A;   Family History  Problem Relation Age of Onset  . Diabetes Father    Social History  Substance Use Topics  . Smoking status: Never Smoker   . Smokeless tobacco: Never Used  . Alcohol Use: No   OB History    No data available     Review of Systems  Unable to perform ROS: Acuity of condition      Allergies  Review of patient's allergies indicates no known allergies.  Home Medications   Prior to Admission medications   Medication Sig Start Date End Date Taking? Authorizing Provider  traMADol (ULTRAM) 50 MG tablet 1-2 every 4 hours as needed for cough or pain Patient not taking: Reported on 05/26/2015 02/16/15   Nyoka Cowden, MD   BP 108/68 mmHg  Pulse 60  Temp(Src) 98.2 F (36.8 C) (Oral)  Resp 13  SpO2 100% Physical Exam  Constitutional: She appears well-developed and well-nourished. She appears listless. No distress.  Patient lying in L lateral decub position. She awakens minimally, falls asleep again quickly.  HENT:  Head: Normocephalic and atraumatic.  Eyes: Conjunctivae and EOM are normal.  Cardiovascular: Normal rate and regular rhythm.   Pulmonary/Chest: Effort normal and breath sounds normal. No stridor. No respiratory distress.  Abdominal: She exhibits no distension.  Musculoskeletal: She exhibits no edema.  Neurological: She appears listless. She displays no atrophy and no tremor. No cranial nerve deficit. She displays no seizure activity.  MAES, minimally follows commands after awakening w stimuli - doesn't keep eyes open.  GCS: 9  Skin: Skin is warm and dry.  Psychiatric: Cognition and memory are  impaired.  Nursing note and vitals reviewed.   ED Course  Procedures (including critical care time) Labs Review Labs Reviewed  CBC - Abnormal; Notable for the following:    RBC 5.25 (*)    Hemoglobin 15.5 (*)    All other components within normal limits  COMPREHENSIVE METABOLIC PANEL -  Abnormal; Notable for the following:    Chloride 112 (*)    Glucose, Bld 107 (*)    All other components within normal limits  I-STAT CHEM 8, ED - Abnormal; Notable for the following:    Sodium 146 (*)    Glucose, Bld 100 (*)    Calcium, Ion 1.27 (*)    Hemoglobin 16.7 (*)    HCT 49.0 (*)    All other components within normal limits  PROTIME-INR  APTT  DIFFERENTIAL  I-STAT TROPOININ, ED  CBG MONITORING, ED    Imaging Review Dg Skull 1-3 Views  08/20/2015  CLINICAL DATA:  Check shunt integrity, currently unconscious EXAM: SKULL - 1-3 VIEW COMPARISON:  None. FINDINGS: No acute skull fracture is noted. A right-sided shunt catheter is seen. Catheter appears intact as visualized. IMPRESSION: Intact VP shunt catheter. Electronically Signed   By: Alcide Clever M.D.   On: 08/20/2015 17:59   Dg Chest 1 View  08/20/2015  CLINICAL DATA:  VP shunt status.  Patient unconscious. EXAM: CHEST 1 VIEW COMPARISON:  01/27/2015 and 05/21/2014 FINDINGS: The to visualize right-sided ventriculostomy catheters are unchanged. Lungs are hypoinflated without consolidation or effusion. Cardiomediastinal silhouette and remainder of the exam is unchanged. IMPRESSION: Hypoinflation without acute cardiopulmonary disease. Two right-sided ventriculostomy catheters unchanged. Electronically Signed   By: Elberta Fortis M.D.   On: 08/20/2015 17:59   Ct Head Wo Contrast  08/20/2015  CLINICAL DATA:  Headache, fatigue, lethargy since this morning. History of VP shunt. EXAM: CT HEAD WITHOUT CONTRAST TECHNIQUE: Contiguous axial images were obtained from the base of the skull through the vertex without intravenous contrast. COMPARISON:  Head CT dated 05/21/2014. FINDINGS: The right ventriculoperitoneal shunt, entering via the right parietal bone, is stable in position traversing the lateral ventricles with tip in the left periventricular white matter region. The ventricles are, however, increased in size compared to the previous  head CT, moderate in degree. There is new sulcal effacement within the bilateral hemispheres related to associated mass effect. Gray-white matter differentiation, however, appears well preserved throughout. There is no mass, hemorrhage, edema, or other evidence of acute parenchymal abnormality. No extra-axial hemorrhage. No acute osseous abnormality. IMPRESSION: 1. Ventriculoperitoneal shunt is stable in position, however, there is a new hydrocephalus of moderate degree suggesting shunt malfunction. 2. New sulcal effacement throughout the bilateral cerebral hemispheres presumably related to associated mass effect. No midline shift or herniation. No parenchymal mass, hemorrhage, or edema identified. These results were called by telephone at the time of interpretation on 08/20/2015 at 5:37 pm to Dr. Jeraldine Loots, who verbally acknowledged these results. Electronically Signed   By: Bary Richard M.D.   On: 08/20/2015 17:37   I have personally reviewed and evaluated these images and lab results as part of my medical decision-making.   EKG Interpretation None     Chart review notable for multiple evaluation of VP shunt, including revision 1 year ago, as below.  Discharge diagnosis, August, 2015. BRIEF HOSPITAL COURSE:  Right Pleural Effusion with possible ventriculopleural shunt infection  - Patient was admitted with concerns for ventriculopleural shunt malfunction and subsequent infection giving her clinical symptoms of low-grade fever and headache. Patient was  admitted and monitored off antibiotics initially, underwent thoracocentesis 8/5 and 8/8, fluid consistent with exudative pathology, all cultures negative so far. Patient also underwent shunt tap which had a benign CSF findings.Patient was seen in consultation by pulmonary critical care, infectious disease and neurosurgery, current consensus was for possible VP shunt infection, as a result patient was empirically started on vancomycin, Rocephin and Flagyl  on 8/9. Patient underwent shunt revision on 8/13, doing well post operative, ID recommending to stop Rocephin and Flagyl, and to continue with IV Vanco for one more week. Placed PICC line, patient will be discharged home, and will receive one more week of Vancomycin as outpatient. She will follow up with the below noted MD's on discharge.She is currently doing well, with resolution of her headache, fever, has no complaints and is requesting discharge. Her pleural fluid cultures will need to be followed till final, she will also need a repeat CXR in 4-6 weeks time to assess if pleural effusion has completely resolved.   Update: After the CT scan results and I discussed her case with our radiology colleagues, and subsequently with neurosurgery colleagues. On repeat exam the patient is listless,essential change from my initial evaluation. Vital signs remain similar, heart rate 60 and blood pressure 108/68.   Update: I discussed patient's case again with our neurosurgical colleagues, who is evaluating the patient. Patient will have emergency department aspiration of her VP shunt. MDM   Final diagnoses:  VP (ventriculoperitoneal) shunt status   hydrocephalus Altered mental status  Young female with history of VP shunt, headaches now presents with headache, altered mental status. Notably, the patient was verbal as about a few hours ago, has become listless since her symptoms have increased. Patient's evaluation here is consistent with hydrocephalus, concern for VP shunt malfunction. After discussed her case with our neurosurgical colleagues, and aspiration of the VP shunt in the emergency department, the patient was admitted for further evaluation, management.  CRITICAL CARE Performed by: Gerhard MunchLOCKWOOD, Khadijah Mastrianni Total critical care time: 40 minutes Critical care time was exclusive of separately billable procedures and treating other patients. Critical care was necessary to treat or prevent imminent or  life-threatening deterioration. Critical care was time spent personally by me on the following activities: development of treatment plan with patient and/or surrogate as well as nursing, discussions with consultants, evaluation of patient's response to treatment, examination of patient, obtaining history from patient or surrogate, ordering and performing treatments and interventions, ordering and review of laboratory studies, ordering and review of radiographic studies, pulse oximetry and re-evaluation of patient's condition.   Gerhard Munchobert Etan Vasudevan, MD 08/20/15 [phone]

## 2015-08-20 NOTE — H&P (Signed)
[name] is an 39 y.o. female.   Chief Complaint: decreasing loc, headaches HPI: over the last 24 hours [name] has complained of headaches, and has become increasingly lethargic. Head CT showed enlarged ventricles compared to last Head CT. Most recently revised 05/2014. Distal catheter was not working at that time. Shunt tap yielded no csf from the [place] reservoir.   Past Medical History  Diagnosis Date  . VP (ventriculoperitoneal) shunt status   . Chronic headaches   . Chronic cough   . 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 Ventriculopleural shunt status     Past Surgical History  Procedure Laterality Date  . Shunt      brain  . Shunt revision ventricular-peritoneal N/A 12/03/2013    Procedure: Revision of Ventricular Peritoneal Shunt;  Surgeon: Ophelia Charter, MD;  Location: [place] [facility];  Service: Neurosurgery;  Laterality: N/A;  . Shunt removal N/A 05/20/2014    Procedure: shunt removal and placement of laproscopic VP shunt with Dr Zella Richer;  Surgeon: Newman Pies, MD;  Location: [facility];  Service: Neurosurgery;  Laterality: N/A;  . Ventriculoperitoneal shunt Right 05/20/2014    Procedure: SHUNT INSERTION VENTRICULAR-PERITONEAL;  Surgeon: Newman Pies, MD;  Location: [place] [facility]UR[facility] OR[facility];  Service: Neurosurgery;  Laterality: Right;  . Laparoscopic revision ventricular-peritoneal (v-p) shunt N/A 05/20/2014    Procedure: LAPAROSCOPIC REVISION VENTRICULAR-PERITONEAL (V-P) SHUNT;  Surgeon: Odis Hollingshead, MD;  Location: [facility];  Service: General;  Laterality: N/A;    Family History  Problem Relation Age of Onset  . Diabetes Father    Social History:  reports that she has never smoked. She has never used smokeless tobacco. She reports that she does not drink alcohol or use illicit drugs.  Allergies: No Known Allergies   (Not in a hospital admission)  Results for orders placed or performed during the hospital encounter of 08/20/15  (from the past 48 hour(s))  Protime-INR     Status: None   Collection Time: 08/20/15  3:30 PM  Result Value Ref Range   Prothrombin Time 14.1 11.6 - 15.2 seconds   INR 1.07 0.00 - 1.49  APTT     Status: None   Collection Time: 08/20/15  3:30 PM  Result Value Ref Range   aPTT 30 24 - 37 seconds  CBC     Status: Abnormal   Collection Time: 08/20/15  3:30 PM  Result Value Ref Range   WBC 6.6 4.0 - 10.5 K/uL   RBC 5.25 (H) 3.87 - 5.11 MIL/uL   Hemoglobin 15.5 (H) 12.0 - 15.0 g/dL   HCT 45.9 36.0 - 46.0 %   MCV 87.4 78.0 - 100.0 fL   MCH 29.5 26.0 - 34.0 pg   MCHC 33.8 30.0 - 36.0 g/dL   RDW 12.9 11.5 - 15.5 %   Platelets 205 150 - 400 K/uL  Differential     Status: None   Collection Time: 08/20/15  3:30 PM  Result Value Ref Range   Neutrophils Relative % 70 %   Neutro Abs 4.7 1.7 - 7.7 K/uL   Lymphocytes Relative 20 %   Lymphs Abs 1.3 0.7 - 4.0 K/uL   Monocytes Relative 8 %   Monocytes Absolute 0.5 0.1 - 1.0 K/uL   Eosinophils Relative 1 %   Eosinophils Absolute 0.1 0.0 - 0.7 K/uL   Basophils Relative 1 %   Basophils Absolute 0.0 0.0 - 0.1 K/uL  Comprehensive metabolic panel  Status: Abnormal   Collection Time: 08/20/15  3:30 PM  Result Value Ref Range   Sodium 143 135 - 145 mmol/L   Potassium 4.1 3.5 - 5.1 mmol/L   Chloride 112 (H) 101 - 111 mmol/L   CO2 25 22 - 32 mmol/L   Glucose, Bld 107 (H) 65 - 99 mg/dL   BUN 11 6 - 20 mg/dL   Creatinine, Ser 0.84 0.44 - 1.00 mg/dL   Calcium 9.5 8.9 - 10.3 mg/dL   Total Protein 7.1 6.5 - 8.1 g/dL   Albumin 4.0 3.5 - 5.0 g/dL   AST 18 15 - 41 U/L   ALT 17 14 - 54 U/L   Alkaline Phosphatase 52 38 - 126 U/L   Total Bilirubin 0.5 0.3 - 1.2 mg/dL   GFR calc non Af Amer >60 >60 mL/min   GFR calc Af Amer >60 >60 mL/min    Comment: (NOTE) The eGFR has been calculated using the CKD EPI equation. This calculation has not been validated in all clinical situations. eGFR's persistently <60 mL/min signify possible Chronic  Kidney Disease.    Anion gap 6 5 - 15  I-stat troponin, ED (not at [facility], [facility])     Status: None   Collection Time: 08/20/15  3:45 PM  Result Value Ref Range   Troponin i, poc 0.00 0.00 - 0.08 ng/mL   Comment 3            Comment: Due to the release kinetics of cTnI, a negative result within the first hours of the onset of symptoms does not rule out myocardial infarction with certainty. If myocardial infarction is still suspected, repeat the test at appropriate intervals.   I-Stat Chem 8, ED  (not at [facility], [facility])     Status: Abnormal   Collection Time: 08/20/15  3:46 PM  Result Value Ref Range   Sodium 146 (H) 135 - 145 mmol/L   Potassium 4.1 3.5 - 5.1 mmol/L   Chloride 109 101 - 111 mmol/L   BUN 13 6 - 20 mg/dL   Creatinine, Ser 0.80 0.44 - 1.00 mg/dL   Glucose, Bld 100 (H) 65 - 99 mg/dL   Calcium, Ion 1.27 (H) 1.12 - 1.23 mmol/L   TCO2 23 0 - 100 mmol/L   Hemoglobin 16.7 (H) 12.0 - 15.0 g/dL   HCT 49.0 (H) 36.0 - 46.0 %   Dg Skull 1-3 Views  08/20/2015  CLINICAL DATA:  Check shunt integrity, currently unconscious EXAM: SKULL - 1-3 VIEW COMPARISON:  None. FINDINGS: No acute skull fracture is noted. A right-sided shunt catheter is seen. Catheter appears intact as visualized. IMPRESSION: Intact VP shunt catheter. Electronically Signed   By: Inez Catalina M.D.   On: 08/20/2015 17:59   Dg Chest 1 View  08/20/2015  CLINICAL DATA:  VP shunt status.  Patient unconscious. EXAM: CHEST 1 VIEW COMPARISON:  01/27/2015 and 05/21/2014 FINDINGS: The to visualize right-sided ventriculostomy catheters are unchanged. Lungs are hypoinflated without consolidation or effusion. Cardiomediastinal silhouette and remainder of the exam is unchanged. IMPRESSION: Hypoinflation without acute cardiopulmonary disease. Two right-sided ventriculostomy catheters unchanged. Electronically Signed   By: Marin Olp M.D.   On: 08/20/2015 17:59   Dg Abd 1 View  08/20/2015  CLINICAL DATA:  VP Shunt status. Pt was  unconscious at time of imaging. Staff not sure if pt has been medically sedated. Efforts were made to ensure the best possible images were obtained in current pt condition. EXAM: ABDOMEN - 1 VIEW COMPARISON:  05/12/2014 FINDINGS: Bowel gas pattern is nonobstructive. No evidence for organomegaly. Pelvic vascular calcifications are noted. Since the previous exam ventriculoperitoneal shunt has been placed. The shunt tubing courses from the right upper quadrant into the central pelvis. The coarse appears unremarkable. And intrauterine device is identified in the central pelvis. Visualized osseous structures have a normal appearance. IMPRESSION: Interval placement eyes ventriculoperitoneal shunt without apparent complications. Electronically Signed   By: Nolon Nations M.D.   On: 08/20/2015 18:31   Ct Head Wo Contrast  08/20/2015  CLINICAL DATA:  Headache, fatigue, lethargy since this morning. History of VP shunt. EXAM: CT HEAD WITHOUT CONTRAST TECHNIQUE: Contiguous axial images were obtained from the base of the skull through the vertex without intravenous contrast. COMPARISON:  Head CT dated 05/21/2014. FINDINGS: The right ventriculoperitoneal shunt, entering via the right parietal bone, is stable in position traversing the lateral ventricles with tip in the left periventricular white matter region. The ventricles are, however, increased in size compared to the previous head CT, moderate in degree. There is new sulcal effacement within the bilateral hemispheres related to associated mass effect. Gray-white matter differentiation, however, appears well preserved throughout. There is no mass, hemorrhage, edema, or other evidence of acute parenchymal abnormality. No extra-axial hemorrhage. No acute osseous abnormality. IMPRESSION: 1. Ventriculoperitoneal shunt is stable in position, however, there is a new hydrocephalus of moderate degree suggesting shunt malfunction. 2. New sulcal effacement throughout the  bilateral cerebral hemispheres presumably related to associated mass effect. No midline shift or herniation. No parenchymal mass, hemorrhage, or edema identified. These results were called by telephone at the time of interpretation on 08/20/2015 at 5:37 pm to Dr. Vanita Panda, who verbally acknowledged these results. Electronically Signed   By: Franki Cabot M.D.   On: 08/20/2015 17:37    Review of Systems  Unable to perform ROS: mental status change    Blood pressure 111/76, pulse 61, temperature 98.2 F (36.8 C), temperature source Oral, resp. rate 15, SpO2 100 %. Physical Exam  Constitutional: She appears well-developed and well-nourished. She appears lethargic. No distress.  HENT:  Head: Normocephalic.  Scars from shunt revisions  Eyes: Pupils are equal, round, and reactive to light.  Neck: Normal range of motion. Neck supple.  Cardiovascular: Normal rate, regular rhythm and normal heart sounds.   Musculoskeletal: Normal range of motion.  Neurological: She appears lethargic.  Unable to perform detailed exam secondary to lethargy Cannot assess sensory exam, did recoil during shunt tap Gait, not assessed. Appropriately moved upper extremities during shunt tap     Assessment/Plan Admit for ventricular catheter placement, and eventual shunt revision.  Admit to ICU  Natayah Warmack L 08/20/2015, 6:49 PM

## 2015-08-20 NOTE — ED Notes (Signed)
Patient transported to radiology

## 2015-08-20 NOTE — Op Note (Signed)
 *   No surgery found *  PATIENT:   [name]  39 y.o. female admitted for shunt malfunction and hydrocephalus. Shunt tap was dry, thus I felt Ms. Johns required csf drainage via a ventricular catheter   PRE-OPERATIVE DIAGNOSIS: Hydrocephalus shunt dependent  POST-OPERATIVE DIAGNOSIS:  Same  PROCEDURE:  right frontal ventricular catheter placement  SURGEON:  Jovian Lembcke ANESTHESIA:   local DRAINS: Ventriculostomy Drain in the right lateral ventricle.   SPECIMEN: csf sent for culture/gram stain, cell count and differential, protein and glucose  DICTATION: [name] had her head shaved and then prepared in a sterile manner. I draped her head  in a sterile manner. I injected lidocaine into the planned coronal incision in line with the right pupillary line anterior to the tragus. I opened the skin with a 15 blade. I used the hand twist drill to create a burr hole. I opened the dura with the 20 gauge spinal needle. I passed the ventricular catheter into the lateral ventricle. There was brisk flow of spinal fluid. I tunneled the catheter posteriorly and secured it to the skin at the exit. I approximated the scalp edges with nylon sutures. I placed a sterile dressing, then connected the catheter to the drainage system.   PLAN OF CARE: Admit to inpatient   PATIENT DISPOSITION:  PACU - hemodynamically stable.

## 2015-08-20 NOTE — ED Notes (Signed)
Pt and family reports p has hx of VP shunt, woke up this am with severe headache and fatigue. Pt is lethargic at triage but able to wake with verbal stimuli and she is answering questions appropriately. Denies n/v.

## 2015-08-21 ENCOUNTER — Inpatient Hospital Stay (HOSPITAL_COMMUNITY): Payer: Medicaid Other

## 2015-08-21 MED ORDER — CHLORHEXIDINE GLUCONATE 0.12 % MT SOLN
15.0000 mL | Freq: Two times a day (BID) | OROMUCOSAL | Status: DC
  Administered 2015-08-21 – 2015-08-24 (×7): 15 mL via OROMUCOSAL
  Filled 2015-08-21 (×2): qty 15

## 2015-08-21 MED ORDER — CETYLPYRIDINIUM CHLORIDE 0.05 % MT LIQD
7.0000 mL | Freq: Two times a day (BID) | OROMUCOSAL | Status: DC
  Administered 2015-08-21 – 2015-08-24 (×6): 7 mL via OROMUCOSAL

## 2015-08-21 NOTE — Progress Notes (Signed)
Per report and shift change, pt requires repeated stimulation to barely follow commands. Pt opens eyes to repeated pain stimulation. IVC to 0cmH20, pulsating but no output. Informed that MD is aware.  Pt now not follow commands with repeated painful stimulation. Pt has purposeful movement (crosses legs, bends knees, scratches face/nose, eyes and crosses arms. Pt not opening eyes to painful stimuli.  IVC continues to pulsate, no drainage noted and air still in line. MD notified of report (what was reported to prior MD), IVC assessment, shift change assessment, and change in assessment of following commands now.No change in IVC. Ordered to continue to monitor closely

## 2015-08-21 NOTE — Progress Notes (Addendum)
Dr. Franky Machoabbell called and notified that drain has air in tubing again, but is still draining although output has slowed. Fluid still pulsating in tubing. Patient continues to follow commands at this time. Will continue to monitor.

## 2015-08-21 NOTE — Progress Notes (Signed)
Patient ID: Uvaldo RisingRosanna Turazzo, female   DOB: 03/12/1976, 39 y.o.   MRN: [id number] BP 127/77 mmHg  Pulse 70  Temp(Src) 100.5 F (38.1 C) (Axillary)  Resp 18  Ht 5\' 6"  (1.676 m)  Wt 63.9 kg (140 lb 14 oz)  BMI 22.75 kg/m2  SpO2 100% Lethargic, will follow commands with significant prompting Head Ct showed air in the ventricle Ventricular catheter was not draining secondary to an air lock. I was able to clear that with manipulation of the drainage tubing.  Anticipate improvement with the ventric is draining

## 2015-08-22 ENCOUNTER — Encounter (HOSPITAL_COMMUNITY)
Admission: EM | Disposition: A | Discharge status: Home / Self Care | Payer: Self-pay | Source: Home / Self Care | Attending: Neurosurgery

## 2015-08-22 ENCOUNTER — Inpatient Hospital Stay (HOSPITAL_COMMUNITY): Payer: Medicaid Other | Admitting: Anesthesiology

## 2015-08-22 ENCOUNTER — Inpatient Hospital Stay (HOSPITAL_COMMUNITY): Payer: Medicaid Other

## 2015-08-22 ENCOUNTER — Encounter (HOSPITAL_COMMUNITY): Payer: Self-pay | Admitting: Certified Registered Nurse Anesthetist

## 2015-08-22 DIAGNOSIS — G911 Obstructive hydrocephalus: Secondary | ICD-10-CM | POA: Diagnosis present

## 2015-08-22 HISTORY — PX: CRANIOTOMY: SHX93

## 2015-08-22 SURGERY — CRANIOTOMY HEMATOMA EVACUATION SUBDURAL
Anesthesia: General | Site: Head | Laterality: Left

## 2015-08-22 MED ORDER — BUPIVACAINE-EPINEPHRINE 0.5% -1:200000 IJ SOLN
INTRAMUSCULAR | Status: DC | PRN
  Administered 2015-08-22: 10 mL

## 2015-08-22 MED ORDER — LACTATED RINGERS IV SOLN
INTRAVENOUS | Status: DC | PRN
  Administered 2015-08-22: 11:00:00 via INTRAVENOUS

## 2015-08-22 MED ORDER — MIDAZOLAM HCL 2 MG/2ML IJ SOLN
INTRAMUSCULAR | Status: AC
  Filled 2015-08-22: qty 2

## 2015-08-22 MED ORDER — PROMETHAZINE HCL 25 MG PO TABS: 12.5000 mg | ORAL_TABLET | ORAL | Status: DC | PRN

## 2015-08-22 MED ORDER — SUCCINYLCHOLINE CHLORIDE 20 MG/ML IJ SOLN
INTRAMUSCULAR | Status: DC | PRN
Start: 2015-08-22 — End: 2015-08-22
  Administered 2015-08-22: 80 mg via INTRAVENOUS

## 2015-08-22 MED ORDER — KCL IN DEXTROSE-NACL 20-5-0.45 MEQ/L-%-% IV SOLN
INTRAVENOUS | Status: DC
  Administered 2015-08-22 – 2015-08-24 (×4): via INTRAVENOUS
  Filled 2015-08-22 (×25): qty 1000

## 2015-08-22 MED ORDER — ONDANSETRON HCL 4 MG PO TABS: 4.0000 mg | ORAL_TABLET | ORAL | Status: DC | PRN

## 2015-08-22 MED ORDER — ACETAMINOPHEN 325 MG PO TABS: 650.0000 mg | ORAL_TABLET | ORAL | Status: DC | PRN

## 2015-08-22 MED ORDER — SUGAMMADEX SODIUM 200 MG/2ML IV SOLN
INTRAVENOUS | Status: DC | PRN
  Administered 2015-08-22: 100 mg via INTRAVENOUS

## 2015-08-22 MED ORDER — MORPHINE SULFATE (PF) 2 MG/ML IV SOLN
1.0000 mg | INTRAVENOUS | Status: DC | PRN
  Administered 2015-08-24: 1 mg via INTRAVENOUS
  Filled 2015-08-22: qty 1

## 2015-08-22 MED ORDER — PANTOPRAZOLE SODIUM 40 MG IV SOLR
40.0000 mg | Freq: Every day | INTRAVENOUS | Status: DC
  Administered 2015-08-22 – 2015-08-29 (×8): 40 mg via INTRAVENOUS
  Filled 2015-08-22 (×8): qty 40

## 2015-08-22 MED ORDER — FENTANYL CITRATE (PF) 250 MCG/5ML IJ SOLN
INTRAMUSCULAR | Status: AC
  Filled 2015-08-22: qty 5

## 2015-08-22 MED ORDER — ACETAMINOPHEN 650 MG RE SUPP: 650.0000 mg | RECTAL | Status: DC | PRN

## 2015-08-22 MED ORDER — HYDROCODONE-ACETAMINOPHEN 5-325 MG PO TABS
1.0000 | ORAL_TABLET | ORAL | Status: DC | PRN
  Administered 2015-08-29: 1 via ORAL
  Filled 2015-08-22: qty 1

## 2015-08-22 MED ORDER — LABETALOL HCL 5 MG/ML IV SOLN: 10.0000 mg | INTRAVENOUS | Status: DC | PRN

## 2015-08-22 MED ORDER — DOCUSATE SODIUM 100 MG PO CAPS
100.0000 mg | ORAL_CAPSULE | Freq: Two times a day (BID) | ORAL | Status: DC
  Administered 2015-08-22 – 2015-09-06 (×25): 100 mg via ORAL
  Filled 2015-08-22 (×27): qty 1

## 2015-08-22 MED ORDER — LIDOCAINE HCL (CARDIAC) 20 MG/ML IV SOLN
INTRAVENOUS | Status: DC | PRN
  Administered 2015-08-22: 40 mg via INTRAVENOUS

## 2015-08-22 MED ORDER — CEFAZOLIN SODIUM-DEXTROSE 2-3 GM-% IV SOLR
INTRAVENOUS | Status: DC | PRN
  Administered 2015-08-22: 2 g via INTRAVENOUS

## 2015-08-22 MED ORDER — FENTANYL CITRATE (PF) 100 MCG/2ML IJ SOLN
INTRAMUSCULAR | Status: DC | PRN
  Administered 2015-08-22: 25 ug via INTRAVENOUS

## 2015-08-22 MED ORDER — SUGAMMADEX SODIUM 200 MG/2ML IV SOLN
INTRAVENOUS | Status: AC
  Filled 2015-08-22: qty 2

## 2015-08-22 MED ORDER — CEFAZOLIN SODIUM 1-5 GM-% IV SOLN
1.0000 g | Freq: Three times a day (TID) | INTRAVENOUS | Status: DC
  Administered 2015-08-23 – 2015-09-05 (×40): 1 g via INTRAVENOUS
  Filled 2015-08-22 (×41): qty 50

## 2015-08-22 MED ORDER — CEFAZOLIN SODIUM-DEXTROSE 2-3 GM-% IV SOLR
2.0000 g | Freq: Three times a day (TID) | INTRAVENOUS | Status: AC
  Administered 2015-08-22 – 2015-08-23 (×2): 2 g via INTRAVENOUS
  Filled 2015-08-22 (×3): qty 50

## 2015-08-22 MED ORDER — PROPOFOL 10 MG/ML IV BOLUS
INTRAVENOUS | Status: DC | PRN
  Administered 2015-08-22: 130 mg via INTRAVENOUS
  Administered 2015-08-22: 30 mg via INTRAVENOUS

## 2015-08-22 MED ORDER — ONDANSETRON HCL 4 MG/2ML IJ SOLN
INTRAMUSCULAR | Status: DC | PRN
  Administered 2015-08-22: 4 mg via INTRAVENOUS

## 2015-08-22 MED ORDER — ROCURONIUM BROMIDE 100 MG/10ML IV SOLN
INTRAVENOUS | Status: DC | PRN
  Administered 2015-08-22: 10 mg via INTRAVENOUS

## 2015-08-22 MED ORDER — 0.9 % SODIUM CHLORIDE (POUR BTL) OPTIME
TOPICAL | Status: DC | PRN
  Administered 2015-08-22: 1000 mL

## 2015-08-22 MED ORDER — ONDANSETRON HCL 4 MG/2ML IJ SOLN: 4.0000 mg | INTRAMUSCULAR | Status: DC | PRN

## 2015-08-22 SURGICAL SUPPLY — 84 items
APL SKNCLS STERI-STRIP NONHPOA (GAUZE/BANDAGES/DRESSINGS)
BANDAGE GAUZE 4  KLING STR (GAUZE/BANDAGES/DRESSINGS) IMPLANT
BENZOIN TINCTURE PRP APPL 2/3 (GAUZE/BANDAGES/DRESSINGS) IMPLANT
BIT DRILL WIRE PASS 1.3MM (BIT) IMPLANT
BNDG GAUZE ELAST 4 BULKY (GAUZE/BANDAGES/DRESSINGS) IMPLANT
BRUSH SCRUB EZ 1% IODOPHOR (MISCELLANEOUS) ×1 IMPLANT
BRUSH SCRUB EZ PLAIN DRY (MISCELLANEOUS) ×1 IMPLANT
BUR ACORN 6.0 PRECISION (BURR) ×1 IMPLANT
BUR SPIRAL ROUTER 2.3 (BUR) IMPLANT
CANISTER SUCT 3000ML PPV (MISCELLANEOUS) ×2 IMPLANT
CATH ROBINSON RED A/P 12FR (CATHETERS) IMPLANT
CATH VENTRIC 35X38 W/TROCAR LG (CATHETERS) ×2 IMPLANT
CLIP TI MEDIUM 6 (CLIP) IMPLANT
CORDS BIPOLAR (ELECTRODE) ×1 IMPLANT
DECANTER SPIKE VIAL GLASS SM (MISCELLANEOUS) ×1 IMPLANT
DRAIN PENROSE 1/2X12 LTX STRL (WOUND CARE) IMPLANT
DRAIN SNY WOU 7FLT (WOUND CARE) IMPLANT
DRAPE INCISE IOBAN 66X45 STRL (DRAPES) ×1 IMPLANT
DRAPE NEUROLOGICAL W/INCISE (DRAPES) ×1 IMPLANT
DRAPE ORTHO SPLIT 77X108 STRL (DRAPES) ×2
DRAPE SURG IRRIG POUCH 19X23 (DRAPES) IMPLANT
DRAPE SURG ORHT 6 SPLT 77X108 (DRAPES) IMPLANT
DRAPE WARM FLUID 44X44 (DRAPE) ×1 IMPLANT
DRILL WIRE PASS 1.3MM (BIT)
DRSG OPSITE 4X5.5 SM (GAUZE/BANDAGES/DRESSINGS) ×2 IMPLANT
DRSG PAD ABDOMINAL 8X10 ST (GAUZE/BANDAGES/DRESSINGS) IMPLANT
DURAPREP 6ML APPLICATOR 50/CS (WOUND CARE) ×1 IMPLANT
ELECT CAUTERY BLADE 6.4 (BLADE) ×2 IMPLANT
ELECT REM PT RETURN 9FT ADLT (ELECTROSURGICAL) ×2
ELECTRODE REM PT RTRN 9FT ADLT (ELECTROSURGICAL) ×1 IMPLANT
EVACUATOR SILICONE 100CC (DRAIN) IMPLANT
GAUZE SPONGE 4X4 12PLY STRL (GAUZE/BANDAGES/DRESSINGS) IMPLANT
GAUZE SPONGE 4X4 16PLY XRAY LF (GAUZE/BANDAGES/DRESSINGS) ×1 IMPLANT
GLOVE BIO SURGEON STRL SZ 6.5 (GLOVE) ×2 IMPLANT
GLOVE BIO SURGEON STRL SZ8 (GLOVE) ×3 IMPLANT
GLOVE BIOGEL PI IND STRL 6.5 (GLOVE) IMPLANT
GLOVE BIOGEL PI IND STRL 8 (GLOVE) ×1 IMPLANT
GLOVE BIOGEL PI IND STRL 8.5 (GLOVE) ×1 IMPLANT
GLOVE BIOGEL PI INDICATOR 6.5 (GLOVE) ×1
GLOVE BIOGEL PI INDICATOR 8 (GLOVE)
GLOVE BIOGEL PI INDICATOR 8.5 (GLOVE) ×1
GLOVE ECLIPSE 7.5 STRL STRAW (GLOVE) ×1 IMPLANT
GLOVE EXAM NITRILE LRG STRL (GLOVE) IMPLANT
GLOVE EXAM NITRILE MD LF STRL (GLOVE) IMPLANT
GLOVE EXAM NITRILE XL STR (GLOVE) IMPLANT
GLOVE EXAM NITRILE XS STR PU (GLOVE) IMPLANT
GOWN STRL REUS W/ TWL LRG LVL3 (GOWN DISPOSABLE) IMPLANT
GOWN STRL REUS W/ TWL XL LVL3 (GOWN DISPOSABLE) IMPLANT
GOWN STRL REUS W/TWL 2XL LVL3 (GOWN DISPOSABLE) IMPLANT
GOWN STRL REUS W/TWL LRG LVL3 (GOWN DISPOSABLE) ×2
GOWN STRL REUS W/TWL XL LVL3 (GOWN DISPOSABLE) ×2
HEMOSTAT SURGICEL 2X14 (HEMOSTASIS) ×1 IMPLANT
KIT BASIN OR (CUSTOM PROCEDURE TRAY) ×2 IMPLANT
KIT DRAIN CSF ACCUDRAIN (MISCELLANEOUS) ×1 IMPLANT
KIT ROOM TURNOVER OR (KITS) ×2 IMPLANT
NDL HYPO 25X1 1.5 SAFETY (NEEDLE) ×1 IMPLANT
NEEDLE HYPO 22GX1.5 SAFETY (NEEDLE) ×1 IMPLANT
NEEDLE HYPO 25X1 1.5 SAFETY (NEEDLE) IMPLANT
NS IRRIG 1000ML POUR BTL (IV SOLUTION) ×2 IMPLANT
PACK CRANIOTOMY (CUSTOM PROCEDURE TRAY) ×1 IMPLANT
PACK SURGICAL SETUP 50X90 (CUSTOM PROCEDURE TRAY) ×1 IMPLANT
PAD ARMBOARD 7.5X6 YLW CONV (MISCELLANEOUS) ×4 IMPLANT
PATTIES SURGICAL .5 X.5 (GAUZE/BANDAGES/DRESSINGS) IMPLANT
PATTIES SURGICAL .5 X3 (DISPOSABLE) IMPLANT
PATTIES SURGICAL 1X1 (DISPOSABLE) IMPLANT
PENCIL BUTTON HOLSTER BLD 10FT (ELECTRODE) ×1 IMPLANT
PIN MAYFIELD SKULL DISP (PIN) IMPLANT
SET BERKELEY SUCTION TUBING (SUCTIONS) ×1 IMPLANT
SET TUBING W/EXT DISP (INSTRUMENTS) ×1 IMPLANT
SPECIMEN JAR SMALL (MISCELLANEOUS) IMPLANT
SPONGE NEURO XRAY DETECT 1X3 (DISPOSABLE) IMPLANT
SPONGE SURGIFOAM ABS GEL 12-7 (HEMOSTASIS) IMPLANT
STAPLER SKIN PROX WIDE 3.9 (STAPLE) ×1 IMPLANT
SUT ETHILON 3 0 PS 1 (SUTURE) ×3 IMPLANT
SUT NURALON 4 0 TR CR/8 (SUTURE) ×2 IMPLANT
SUT VIC AB 2-0 CP2 18 (SUTURE) ×2 IMPLANT
SUT VIC AB 3-0 SH 8-18 (SUTURE) IMPLANT
SYR CONTROL 10ML LL (SYRINGE) ×2 IMPLANT
TOWEL OR 17X24 6PK STRL BLUE (TOWEL DISPOSABLE) ×2 IMPLANT
TOWEL OR 17X26 10 PK STRL BLUE (TOWEL DISPOSABLE) ×2 IMPLANT
TRAP SPECIMEN MUCOUS 40CC (MISCELLANEOUS) IMPLANT
TRAY FOLEY W/METER SILVER 14FR (SET/KITS/TRAYS/PACK) IMPLANT
UNDERPAD 30X30 INCONTINENT (UNDERPADS AND DIAPERS) IMPLANT
WATER STERILE IRR 1000ML POUR (IV SOLUTION) ×1 IMPLANT

## 2015-08-22 NOTE — Brief Op Note (Signed)
08/20/2015 - 08/22/2015  12:21 PM  PATIENT:  [name]  39 y.o. 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   PROCEDURE:  Procedure(s): Left ventricular drain insertion (Left) with replacement of right frontal IVC  SURGEON:  Surgeon(s) and Role:    * Maeola HarmanJoseph Torren Maffeo, MD - Primary  PHYSICIAN ASSISTANT:   ASSISTANTS: none   ANESTHESIA:   general  EBL:  Total I/O In: 320 [I.V.:320] Out: 5 [Blood:5]  BLOOD ADMINISTERED:none  DRAINS: Ventriculostomy Drain in the both frontal horns of lateral ventricles   LOCAL MEDICATIONS USED:  LIDOCAINE   SPECIMEN:  No Specimen  DISPOSITION OF SPECIMEN:  N/A  COUNTS:  YES  TOURNIQUET:  * No tourniquets in log *  DICTATION: Preop Dx:  Hydrocephalus Postop Dx: Same Procedure: Left frontal IVC placement and revision of right frontal IVC Surgeon: Venetia MaxonStern Anesthesia: General with lidocaine Complications: None  Patient has HCP with obtundation.  She has non-functioning right frontal drain and what appears to be a trapped left frontal horn and loculated third ventricle.  It was elected to place a new left frontal IVC and to replace the right frontal non-functioning catheter and place it deeper than the previous tube.  Bifrontal scalp was prepped and draped after shaving this region.  Area of planned incision was infiltrated with lidocaine with epinephrine.  A stab incision was created and trephine made on the left. The dura was incised and IVC drain was placed with brisk return of CSF under moderate pressure.  The drain was tunneled, anchored and hooked to the Buretrol after incisions were closed with 3-0 Nylon sutures.   Attention was then turned to the right.  Prior incision was reopened and the catheter was removed. A new IVC drain was placed with brisk return of CSF under low   Pressure.  It was placed deeper along the previous tract to a depth of 9 cm.  The drain was tunneled, anchored and hooked to the Buretrol after incisions were closed with 3-0 Nylon sutures.The wounds were dressed with sterile occlusive dressings.  The patient was extubated in the operating room, having appeared to have tolerated the procedure well.  PLAN OF CARE: Admit to inpatient   PATIENT DISPOSITION:  PACU - hemodynamically stable.   Delay start of Pharmacological VTE agent (>24hrs) due to surgical blood loss or risk of bleeding: yes

## 2015-08-22 NOTE — Anesthesia Procedure Notes (Signed)
Procedure Name: Intubation Date/Time: 08/22/2015 11:43 AM Performed by: Daiva EvesAVENEL, Campbell Kray W Pre-anesthesia Checklist: Patient identified, Timeout performed, Emergency Drugs available, Suction available and Patient being monitored Patient Re-evaluated:Patient Re-evaluated prior to inductionOxygen Delivery Method: Circle system utilized Preoxygenation: Pre-oxygenation with 100% oxygen Intubation Type: IV induction Ventilation: Mask ventilation without difficulty Laryngoscope Size: Mac and 3 Grade View: Grade I Tube type: Subglottic suction tube Tube size: 7.5 mm Number of attempts: 1 Placement Confirmation: ETT inserted through vocal cords under direct vision,  breath sounds checked- equal and bilateral,  positive ETCO2 and CO2 detector Secured at: 22 cm Tube secured with: Tape Dental Injury: Teeth and Oropharynx as per pre-operative assessment

## 2015-08-22 NOTE — Anesthesia Preprocedure Evaluation (Addendum)
Anesthesia Evaluation  Patient identified by MRN, date of birth, ID bandGeneral Assessment Comment:Patient responds to pain  Reviewed: Allergy & Precautions, H&P , NPO status , Patient's Chart, lab work & pertinent test results  Airway Mallampati: II  TM Distance: >3 FB Neck ROM: Full   Comment: Patient responds only to pain. Unable to evaluate airway due to decreased LOC. Dental no notable dental hx. (+) Teeth Intact   Pulmonary neg pulmonary ROS,    Pulmonary exam normal breath sounds clear to auscultation       Cardiovascular negative cardio ROS   Rhythm:Regular Rate:Normal     Neuro/Psych  Headaches, VP shunt in place Hydrocephalus Decreased LOC negative neurological ROS  negative psych ROS   GI/Hepatic negative GI ROS, Neg liver ROS,   Endo/Other  negative endocrine ROS  Renal/GU negative Renal ROS  negative genitourinary   Musculoskeletal   Abdominal   Peds  Hematology negative hematology ROS (+)   Anesthesia Other Findings   Reproductive/Obstetrics negative OB ROS                         Anesthesia Physical Anesthesia Plan  ASA: III and emergent  Anesthesia Plan: General   Post-op Pain Management:    Induction: Intravenous  Airway Management Planned: Oral ETT  Additional Equipment:   Intra-op Plan:   Post-operative Plan: Extubation in OR and Possible Post-op intubation/ventilation  Informed Consent: I have reviewed the patients History and Physical, chart, labs and discussed the procedure including the risks, benefits and alternatives for the proposed anesthesia with the patient or authorized representative who has indicated his/her understanding and acceptance.   Dental advisory given  Plan Discussed with: CRNA  Anesthesia Plan Comments:       Anesthesia Quick Evaluation

## 2015-08-22 NOTE — Care Management Note (Signed)
Case Management Note  Patient Details  Name: [name] MRN: [id number] Date of Birth: 07/10/1976  Subjective/Objective:   Pt admitted on 08/20/15 with malfunction of ventriculo-peritoneal shunt.  PTA, pt independent, lives with spouse.                   Action/Plan: Pt to OR today for Lt ventricular drain insertion with replacement of Rt frontal IVC.  Will follow for discharge planning as pt progresses.    Expected Discharge Date:                  Expected Discharge Plan:  Home w Home Health Services  In-House Referral:     Discharge planning Services  CM Consult  Post Acute Care Choice:    Choice offered to:     DME Arranged:    DME Agency:     HH Arranged:    HH Agency:     Status of Service:  In process, will continue to follow  Medicare Important Message Given:    Date Medicare IM Given:    Medicare IM give by:    Date Additional Medicare IM Given:    Additional Medicare Important Message give by:     If discussed at Long Length of Stay Meetings, dates discussed:    Additional Comments:  Quintella BatonJulie W. Shimeka Bacot, RN, BSN  Trauma/Neuro ICU Case Manager [phone]

## 2015-08-22 NOTE — Op Note (Signed)
08/20/2015 - 08/22/2015  12:21 PM  PATIENT:  [name]  39 y.o. 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   PROCEDURE:  Procedure(s): Left ventricular drain insertion (Left) with replacement of right frontal IVC  SURGEON:  Surgeon(s) and Role:    * Swayzie Choate, MD - Primary  PHYSICIAN ASSISTANT:   ASSISTANTS: none   ANESTHESIA:   general  EBL:  Total I/O In: 320 [I.V.:320] Out: 5 [Blood:5]  BLOOD ADMINISTERED:none  DRAINS: Ventriculostomy Drain in the both frontal horns of lateral ventricles   LOCAL MEDICATIONS USED:  LIDOCAINE   SPECIMEN:  No Specimen  DISPOSITION OF SPECIMEN:  N/A  COUNTS:  YES  TOURNIQUET:  * No tourniquets in log *  DICTATION: Preop Dx:  Hydrocephalus Postop Dx: Same Procedure: Left frontal IVC placement and revision of right frontal IVC Surgeon: Prestyn Stanco Anesthesia: General with lidocaine Complications: None  Patient has HCP with obtundation.  She has non-functioning right frontal drain and what appears to be a trapped left frontal horn and loculated third ventricle.  It was elected to place a new left frontal IVC and to replace the right frontal non-functioning catheter and place it deeper than the previous tube.  Bifrontal scalp was prepped and draped after shaving this region.  Area of planned incision was infiltrated with lidocaine with epinephrine.  A stab incision was created and trephine made on the left. The dura was incised and IVC drain was placed with brisk return of CSF under moderate pressure.  The drain was tunneled, anchored and hooked to the Buretrol after incisions were closed with 3-0 Nylon sutures.   Attention was then turned to the right.  Prior incision was reopened and the catheter was removed. A new IVC drain was placed with brisk return of CSF under low   Pressure.  It was placed deeper along the previous tract to a depth of 9 cm.  The drain was tunneled, anchored and hooked to the Buretrol after incisions were closed with 3-0 Nylon sutures.The wounds were dressed with sterile occlusive dressings.  The patient was extubated in the operating room, having appeared to have tolerated the procedure well.  PLAN OF CARE: Admit to inpatient   PATIENT DISPOSITION:  PACU - hemodynamically stable.   Delay start of Pharmacological VTE agent (>24hrs) due to surgical blood loss or risk of bleeding: yes  

## 2015-08-22 NOTE — Transfer of Care (Signed)
Immediate Anesthesia Transfer of Care Note  Patient: [name]  Procedure(s) Performed: Procedure(s): Left ventricular drain insertion (Left)  Patient Location: ICU  Anesthesia Type:General  Level of Consciousness: responds to stimulation and obtunded  Airway & Oxygen Therapy: Patient Spontanous Breathing  Post-op Assessment: Report given to RN and Post -op Vital signs reviewed and stable  Post vital signs: Reviewed and stable  Last Vitals:  Filed Vitals:   08/22/15 1100  BP: 122/75  Pulse:   Temp:   Resp: 15    Complications: No apparent anesthesia complications

## 2015-08-22 NOTE — Progress Notes (Signed)
I have reviewed new Head CT, which shows left ventriculomegaly and right sided intracranial air with persistent ventriculomegaly with some intraventricular blood.  In light of patient's poor neurologic status, I plan on placing new left frontal IVC with repositioning right frontal catheter.  I have discussed situation and plan with patient's husband, Marquita PalmsMario, who agrees and wishes for me to proceed.

## 2015-08-22 NOTE — Progress Notes (Signed)
Dr. Venetia MaxonStern made aware of patient's neuro status decline, not following commands, and only localizing. Received orders for stat CT. Will continue to monitor patient.

## 2015-08-22 NOTE — Anesthesia Postprocedure Evaluation (Signed)
  Anesthesia Post-op Note  Patient: [name]  Procedure(s) Performed: Procedure(s): Left ventricular drain insertion (Left)  Patient Location: PACU  Anesthesia Type:General  Level of Consciousness: awake and alert   Airway and Oxygen Therapy: Patient Spontanous Breathing  Post-op Pain: Controlled  Post-op Assessment: Post-op Vital signs reviewed, Patient's Cardiovascular Status Stable and Respiratory Function Stable  Post-op Vital Signs: Reviewed  Filed Vitals:   08/22/15 1100  BP: 122/75  Pulse:   Temp:   Resp: 15    Complications: No apparent anesthesia complications

## 2015-08-22 NOTE — Progress Notes (Signed)
Subjective: Patient reports not responsive to voice.    Objective: Vital signs in last 24 hours: Temp:  [98.1 F (36.7 C)-100.8 F (38.2 C)] 98.6 F (37 C) (11/14 0400) Pulse Rate:  [54-89] 73 (11/14 0800) Resp:  [13-18] 16 (11/14 0900) BP: (111-146)/(66-89) 111/72 mmHg (11/14 0900) SpO2:  [96 %-100 %] 96 % (11/14 0800)  Intake/Output from previous day: 11/13 0701 - 11/14 0700 In: 1990 [I.V.:1840; IV Piggyback:150] Out: 58 [Drains:58] Intake/Output this shift: Total I/O In: 160 [I.V.:160] Out: 0   Physical Exam: Patient not opening eyes.  Withdraws all four extremities to pain.  IVC not actively draining, but pulsatile.   Lab Results:  Recent Labs  08/20/15 1530 08/20/15 1546  WBC 6.6  --   HGB 15.5* 16.7*  HCT 45.9 49.0*  PLT 205  --    BMET  Recent Labs  08/20/15 1530 08/20/15 1546  NA 143 146*  K 4.1 4.1  CL 112* 109  CO2 25  --   GLUCOSE 107* 100*  BUN 11 13  CREATININE 0.84 0.80  CALCIUM 9.5  --     Studies/Results: Ct Abdomen Pelvis Wo Contrast  08/21/2015  CLINICAL DATA:  Hydrocephalus, shunt malfunction, status post right frontal ventriculostomy EXAM: CT ABDOMEN AND PELVIS WITHOUT CONTRAST TECHNIQUE: Multidetector CT imaging of the abdomen and pelvis was performed following the standard protocol without IV contrast. COMPARISON:  12/30/2013, 08/20/2015, 08/21/2015 FINDINGS: Lower chest: Bibasilar atelectasis, worse on the right. Small focal loculated right pleural effusion where the previous right pleural drain terminates. Normal heart size. No pericardial effusion. No left pleural fluid. Abdomen: The anterior chest abdominal wall peritoneal shunt enters the peritoneal cavity beneath the right subcostal margin and coils in the right lower quadrant appearing intact. Small amount of hyperdense material adjacent to the peritoneal tubing in the right upper quadrant, images 33 and 34 appearing separate from the tubing may be postoperative. No intra-abdominal  or pelvic fluid collection, loculated ascites, hemorrhage, or abscess. No free air. Liver, gallbladder, biliary system, pancreas, spleen, adrenal glands, and kidneys appear within normal limits for age and demonstrate no acute process by noncontrast imaging. Negative for bowel obstruction, dilatation, ileus, or free air. Normal caliber aorta. Negative for aneurysm. No acute vascular process or hemorrhage. Normal appendix demonstrated in the right lower quadrant. Pelvis: IUD within the endometrial cavity. Uterus normal in size. Trace pelvic fluid possibly physiologic. Urinary bladder under distended. No pelvic fluid collection, hemorrhage, or abscess. No acute distal bowel process. No inguinal abnormality or abdominal wall hernia. No acute osseous finding. IMPRESSION: Negative for intra-abdominal fluid collection, loculated ascites, or abscess. Visualized peritoneal shunt tube extending across lower chest into the abdomen and pelvis appears intact. Right chest pleural tubing from a previous shunt noted terminating in the posterior right pleural space where there is a residual small loculated right pleural effusion. Bibasilar atelectasis No other acute intra-abdominal or pelvic process IUD within the endometrial cavity. Electronically Signed   By: Judie PetitM.  Shick M.D.   On: 08/21/2015 09:09   Dg Skull 1-3 Views  08/20/2015  CLINICAL DATA:  Check shunt integrity, currently unconscious EXAM: SKULL - 1-3 VIEW COMPARISON:  None. FINDINGS: No acute skull fracture is noted. A right-sided shunt catheter is seen. Catheter appears intact as visualized. IMPRESSION: Intact VP shunt catheter. Electronically Signed   By: Alcide CleverMark  Lukens M.D.   On: 08/20/2015 17:59   Dg Chest 1 View  08/20/2015  CLINICAL DATA:  VP shunt status.  Patient unconscious. EXAM: CHEST 1 VIEW  COMPARISON:  01/27/2015 and 05/21/2014 FINDINGS: The to visualize right-sided ventriculostomy catheters are unchanged. Lungs are hypoinflated without consolidation or  effusion. Cardiomediastinal silhouette and remainder of the exam is unchanged. IMPRESSION: Hypoinflation without acute cardiopulmonary disease. Two right-sided ventriculostomy catheters unchanged. Electronically Signed   By: Elberta Fortis M.D.   On: 08/20/2015 17:59   Dg Abd 1 View  08/20/2015  CLINICAL DATA:  VP Shunt status. Pt was unconscious at time of imaging. Staff not sure if pt has been medically sedated. Efforts were made to ensure the best possible images were obtained in current pt condition. EXAM: ABDOMEN - 1 VIEW COMPARISON:  05/12/2014 FINDINGS: Bowel gas pattern is nonobstructive. No evidence for organomegaly. Pelvic vascular calcifications are noted. Since the previous exam ventriculoperitoneal shunt has been placed. The shunt tubing courses from the right upper quadrant into the central pelvis. The coarse appears unremarkable. And intrauterine device is identified in the central pelvis. Visualized osseous structures have a normal appearance. IMPRESSION: Interval placement eyes ventriculoperitoneal shunt without apparent complications. Electronically Signed   By: Norva Pavlov M.D.   On: 08/20/2015 18:31   Ct Head Wo Contrast  08/21/2015  CLINICAL DATA:  Continued surveillance status post external ventricular drainage for hydrocephalus. EXAM: CT HEAD WITHOUT CONTRAST TECHNIQUE: Contiguous axial images were obtained from the base of the skull through the vertex without intravenous contrast. COMPARISON:  Preprocedural CT 08/20/2015. Also multiple prior CT head studies, including 05/21/2014 following shunt revision last year. FINDINGS: Previously placed RIGHT parietal shunt catheter is unchanged, with its tip lying in the LEFT periventricular white matter, and the shunt crossing both lateral ventricular bodies. Small amount of air is seen in the [place] reservoir from previous attempts at aspiration. New external ventricular drainage catheter has been placed from a RIGHT frontal approach. The  tip lies in the anterior third ventricle having crossed the RIGHT frontal horn lateral ventricle. Intraventricular air is seen. Layering hemorrhage in both atria is observed. Based on distention of the third ventricle and temporal horns, hydrocephalus is actually slightly worse than before the EVD was placed. No extra-axial fluid collections of significance. No parenchymal hemorrhage. Basilar cisterns remain mildly effaced. Calvarium reflects previous burr holes but is otherwise intact. No sinus or mastoid disease. Dysconjugate gaze. IMPRESSION: Satisfactory placement of RIGHT frontal external ventricular drainage catheter. Slight worsening hydrocephalus with increasing turgidity of the temporal horns and third ventricle. Intraventricular air and hemorrhage is new from yesterday's scan. Electronically Signed   By: Elsie Stain M.D.   On: 08/21/2015 08:29   Ct Head Wo Contrast  08/20/2015  CLINICAL DATA:  Headache, fatigue, lethargy since this morning. History of VP shunt. EXAM: CT HEAD WITHOUT CONTRAST TECHNIQUE: Contiguous axial images were obtained from the base of the skull through the vertex without intravenous contrast. COMPARISON:  Head CT dated 05/21/2014. FINDINGS: The right ventriculoperitoneal shunt, entering via the right parietal bone, is stable in position traversing the lateral ventricles with tip in the left periventricular white matter region. The ventricles are, however, increased in size compared to the previous head CT, moderate in degree. There is new sulcal effacement within the bilateral hemispheres related to associated mass effect. Gray-white matter differentiation, however, appears well preserved throughout. There is no mass, hemorrhage, edema, or other evidence of acute parenchymal abnormality. No extra-axial hemorrhage. No acute osseous abnormality. IMPRESSION: 1. Ventriculoperitoneal shunt is stable in position, however, there is a new hydrocephalus of moderate degree suggesting  shunt malfunction. 2. New sulcal effacement throughout the bilateral cerebral hemispheres presumably  related to associated mass effect. No midline shift or herniation. No parenchymal mass, hemorrhage, or edema identified. These results were called by telephone at the time of interpretation on 08/20/2015 at 5:37 pm to Dr. Jeraldine Loots, who verbally acknowledged these results. Electronically Signed   By: Bary Richard M.D.   On: 08/20/2015 17:37    Assessment/Plan: Lower IVC to -5 cm, STAT head CT to assess hydrocephalus.  Patient ventilating well.  Continue to monitor in ICU.    LOS: 2 days    Dorian Heckle, MD 08/22/2015, 9:13 AM

## 2015-08-22 NOTE — Progress Notes (Signed)
~  [id number]: In room reassessing pt and repositioning. When painful stimulus applied, noted pt to have extension of BUE. Pain stimulus applied again, pt appeared to have a flexion movement of BUE. Pupils 4-5cm, sluggish equally. Noted toes to wiggle off and on during assessment. Pt cleaned with another RN. Painful stimulus applied one last time, pt appeared to localize towards stimulus. Pt then reaches up and scratches nose with right hand. Will continue to monitor closely and report to oncoming RN.

## 2015-08-23 ENCOUNTER — Encounter (HOSPITAL_COMMUNITY): Payer: Self-pay | Admitting: Neurosurgery

## 2015-08-23 MED ORDER — WHITE PETROLATUM GEL
Status: AC
  Administered 2015-08-23: 0.2
  Filled 2015-08-23: qty 1

## 2015-08-23 NOTE — Progress Notes (Signed)
Subjective: Patient reports "I feel ok ...no (headache)"  Objective: Vital signs in last 24 hours: Temp:  [98.2 F (36.8 C)-99.2 F (37.3 C)] 99 F (37.2 C) (11/15 0750) Pulse Rate:  [64-99] 84 (11/15 0700) Resp:  [12-18] 14 (11/15 0300) BP: (111-132)/(69-89) 121/74 mmHg (11/15 0700) SpO2:  [96 %-100 %] 100 % (11/15 0700)  Intake/Output from previous day: 11/14 0701 - 11/15 0700 In: 2833.4 [P.O.:200; I.V.:2533.4; IV Piggyback:100] Out: 223 [Drains:218; Blood:5] Intake/Output this shift:    Alert, responding appropriately to questions, oriented to place and person. Moves extremities to command. Tongue protrudes midline. No drift. Downward gaze, follows OD, OS will not follow leftward.   Lab Results:  Recent Labs  08/20/15 1530 08/20/15 1546  WBC 6.6  --   HGB 15.5* 16.7*  HCT 45.9 49.0*  PLT 205  --    BMET  Recent Labs  08/20/15 1530 08/20/15 1546  NA 143 146*  K 4.1 4.1  CL 112* 109  CO2 25  --   GLUCOSE 107* 100*  BUN 11 13  CREATININE 0.84 0.80  CALCIUM 9.5  --     Studies/Results: Ct Head Wo Contrast  08/22/2015  CLINICAL DATA:  Follow-up hydrocephalus. Ventricular catheter placement. EXAM: CT HEAD WITHOUT CONTRAST TECHNIQUE: Contiguous axial images were obtained from the base of the skull through the vertex without intravenous contrast. COMPARISON:  08/21/2015 FINDINGS: Right frontal ventricular drainage catheter in the right lateral ventricle unchanged. Ventricular peritoneal shunt from a right parietal approach crosses the midline with tip in the left caudate which is unchanged. There is gas in the frontal horns bilaterally similar in amount. There is also gas in the left temporal horn which is new. Ventricular enlargement is slightly improved in the interval. Intraventricular hemorrhage again noted. Blood around the ventricular peritoneal shunt catheter is unchanged. There is layering blood in the occipital horns which may be slightly greater and has  migrated from the right to the left side. Some of this may be positional and due to CSF circulation. Negative for acute infarct.  Negative for mass lesion. IMPRESSION: Ventricular enlargement slightly improved. Ventricular hemorrhage is slightly greater than on the prior study. Some of this may be due to patient positioning and CSF circulation however there appears to be slight increase in blood layering in the occipital horns bilaterally. Electronically Signed   By: Marlan Palauharles  Clark M.D.   On: 08/22/2015 10:17    Assessment/Plan: Improving   LOS: 3 days  Continue support   Georgiann Cockeroteat, Alyzae Hawkey 08/23/2015, 7:55 AM

## 2015-08-23 NOTE — Progress Notes (Addendum)
Upon assessment, pt unable to answer orientation questions. When asked how many fingers I was holding up, pt answered correctly multiple times. Pupils 3 sluggish & equal, however, pt now has downward gaze noted that doesn't cross midline. Pt able to follow some commands (finger to nose, show two fingers), however doesn't wiggle toes to command or squeeze/release grips to command. Purposeful movement in all 4 extremities. Left IVC draining 10-7913ml per hr. ICP 2-3.   Dr Lovell SheehanJenkins notified. No new orders given. Will continue to monitor and will notify MD if neuro assessment changes

## 2015-08-23 NOTE — Progress Notes (Signed)
Pt now oriented x 2, speaking and intermittently following commands. Will continue to monitor.

## 2015-08-24 ENCOUNTER — Other Ambulatory Visit: Payer: Self-pay | Admitting: Neurosurgery

## 2015-08-24 LAB — CSF CULTURE W GRAM STAIN

## 2015-08-24 LAB — CSF CULTURE: CULTURE: NO GROWTH

## 2015-08-24 NOTE — Progress Notes (Signed)
Subjective: Patient reports more awake, conversant.  Objective: Vital signs in last 24 hours: Temp:  [98.6 F (37 C)-99.5 F (37.5 C)] 99.5 F (37.5 C) (11/16 0400) Pulse Rate:  [64-98] 64 (11/16 0700) Resp:  [10-25] 12 (11/16 0700) BP: (83-141)/(55-99) 111/72 mmHg (11/16 0700) SpO2:  [96 %-100 %] 96 % (11/16 0700)  Intake/Output from previous day: 11/15 0701 - 11/16 0700 In: 2334 [P.O.:384; I.V.:1800; IV Piggyback:150] Out: 250 [Drains:250] Intake/Output this shift:    Physical Exam: Still with downgaze, EOM otherwise intact.  No accommodation.  MAEW without drift.  IVC on left patent and draining.  CSF still bloody.  Lab Results: No results for input(s): WBC, HGB, HCT, PLT in the last 72 hours. BMET No results for input(s): NA, K, CL, CO2, GLUCOSE, BUN, CREATININE, CALCIUM in the last 72 hours.  Studies/Results: Ct Head Wo Contrast  08/22/2015  CLINICAL DATA:  Follow-up hydrocephalus. Ventricular catheter placement. EXAM: CT HEAD WITHOUT CONTRAST TECHNIQUE: Contiguous axial images were obtained from the base of the skull through the vertex without intravenous contrast. COMPARISON:  08/21/2015 FINDINGS: Right frontal ventricular drainage catheter in the right lateral ventricle unchanged. Ventricular peritoneal shunt from a right parietal approach crosses the midline with tip in the left caudate which is unchanged. There is gas in the frontal horns bilaterally similar in amount. There is also gas in the left temporal horn which is new. Ventricular enlargement is slightly improved in the interval. Intraventricular hemorrhage again noted. Blood around the ventricular peritoneal shunt catheter is unchanged. There is layering blood in the occipital horns which may be slightly greater and has migrated from the right to the left side. Some of this may be positional and due to CSF circulation. Negative for acute infarct.  Negative for mass lesion. IMPRESSION: Ventricular enlargement slightly  improved. Ventricular hemorrhage is slightly greater than on the prior study. Some of this may be due to patient positioning and CSF circulation however there appears to be slight increase in blood layering in the occipital horns bilaterally. Electronically Signed   By: Marlan Palauharles  Clark M.Johns.   On: 08/22/2015 10:17    Assessment/Plan: Patient improving.  Needs for blood in CSF to clear before shunt replaced.  OOB to chair today.  Right sided system no drainage.    LOS: 4 days    [name],Lisa Maclachlan D, MD 08/24/2015, 8:07 AM

## 2015-08-25 NOTE — Progress Notes (Signed)
Husband Marquita PalmsMario updated over phone.

## 2015-08-25 NOTE — Progress Notes (Signed)
Subjective: Patient reports improving  Objective: Vital signs in last 24 hours: Temp:  [97.6 F (36.4 C)-98.9 F (37.2 C)] 98.5 F (36.9 C) (11/17 1107) Pulse Rate:  [58-94] 83 (11/17 1210) Resp:  [12-24] 24 (11/17 1210) BP: (105-134)/(54-90) 109/74 mmHg (11/17 1210) SpO2:  [98 %-100 %] 99 % (11/17 1210)  Intake/Output from previous day: 11/16 0701 - 11/17 0700 In: 3273 [P.O.:1320; I.V.:1803; IV Piggyback:150] Out: 186 [Drains:186] Intake/Output this shift: Total I/O In: 615 [P.O.:240; I.V.:375] Out: 7529 [Drains:29]  Physical Exam: CSF still bloody.  Left sided drain working well, no drainage from right.  Still with downgaze, but improved LOC.  MAEW and engaging in conversation.  Lab Results: No results for input(s): WBC, HGB, HCT, PLT in the last 72 hours. BMET No results for input(s): NA, K, CL, CO2, GLUCOSE, BUN, CREATININE, CALCIUM in the last 72 hours.  Studies/Results: No results found.  Assessment/Plan: Patient improving.  Will allow blood to clear before placing new shunt.  I plan on doing so early next week.    LOS: 5 days    Dorian HeckleSTERN,Castor Gittleman D, MD 08/25/2015, 12:52 PM

## 2015-08-26 LAB — URINALYSIS, ROUTINE W REFLEX MICROSCOPIC
Bilirubin Urine: NEGATIVE
GLUCOSE, UA: NEGATIVE mg/dL
Ketones, ur: NEGATIVE mg/dL
LEUKOCYTES UA: NEGATIVE
NITRITE: NEGATIVE
PH: 5.5 (ref 5.0–8.0)
Protein, ur: NEGATIVE mg/dL
SPECIFIC GRAVITY, URINE: 1.009 (ref 1.005–1.030)

## 2015-08-26 LAB — URINE MICROSCOPIC-ADD ON

## 2015-08-26 NOTE — Progress Notes (Signed)
Subjective: Patient reports "I feel ok"  Objective: Vital signs in last 24 hours: Temp:  [98.3 F (36.8 C)-98.8 F (37.1 C)] 98.3 F (36.8 C) (11/18 0400) Pulse Rate:  [58-95] 74 (11/18 0700) Resp:  [11-24] 14 (11/18 0700) BP: (90-127)/(45-79) 107/72 mmHg (11/18 0700) SpO2:  [96 %-100 %] 99 % (11/18 0700)  Intake/Output from previous day: 11/17 0701 - 11/18 0700 In: 2681 [P.O.:721; I.V.:1810; IV Piggyback:150] Out: 277 [Urine:100; Drains:177] Intake/Output this shift:    Alert, conversant. Exam without change. Downward gaze persists. MAEW. Blood tinged CSF.  (Right did begin draining last night)  Urinary frequency last 24hrs, denying burning or pain.   Lab Results: No results for input(s): WBC, HGB, HCT, PLT in the last 72 hours. BMET No results for input(s): NA, K, CL, CO2, GLUCOSE, BUN, CREATININE, CALCIUM in the last 72 hours.  Studies/Results: No results found.  Assessment/Plan:   LOS: 6 days  Shunt revision planned for early next week. Pt agreeable.    Georgiann Cockeroteat, Anglia Blakley 08/26/2015, 7:54 AM

## 2015-08-27 MED ORDER — INFLUENZA VAC SPLIT QUAD 0.5 ML IM SUSY
0.5000 mL | PREFILLED_SYRINGE | INTRAMUSCULAR | Status: AC
  Administered 2015-08-28: 0.5 mL via INTRAMUSCULAR
  Filled 2015-08-27: qty 0.5

## 2015-08-27 NOTE — Progress Notes (Addendum)
0100 entered patient room and found IVC dressing pulled off.  Drain and sutures still intact. Pt states her head was itching so she attempted to pull tape off of her head and wiped her head with baby wipes from her bedside table. IVC site cleansed with chloraprep and new tegaderm dressing placed. Re-educated patient on site care. Pt states she will not touch the dressing.  [id number] Dr. Venetia MaxonStern at bedside. Informed of dressing change. Will continue to educate patient on site care and infection prevention.

## 2015-08-27 NOTE — Progress Notes (Addendum)
Right IVC drain noted to not be pulsating. Drain dropped to floor, no pulsation noted. Patient neurologically still intact with no changes. Dr. Jeral FruitBotero called and notified. Will continue to monitor patient.

## 2015-08-27 NOTE — Progress Notes (Signed)
Subjective: Patient reports doing ok  Objective: Vital signs in last 24 hours: Temp:  [97.8 F (36.6 C)-98.9 F (37.2 C)] 98.9 F (37.2 C) (11/19 0400) Pulse Rate:  [63-87] 71 (11/19 0500) Resp:  [10-22] 12 (11/19 0500) BP: (95-120)/(54-80) 99/59 mmHg (11/19 0500) SpO2:  [94 %-100 %] 96 % (11/19 0500)  Intake/Output from previous day: 11/18 0701 - 11/19 0700 In: 540 [P.O.:440; IV Piggyback:100] Out: 71 [Drains:71] Intake/Output this shift: Total I/O In: 300 [P.O.:200; IV Piggyback:100] Out: 24 [Drains:24]  Physical Exam: Awake, alert, conversant.  Downgaze persists.  Left IVC draining well.  Right with minimal drainage.  Lab Results: No results for input(s): WBC, HGB, HCT, PLT in the last 72 hours. BMET No results for input(s): NA, K, CL, CO2, GLUCOSE, BUN, CREATININE, CALCIUM in the last 72 hours.  Studies/Results: No results found.  Assessment/Plan: Improving neuro status.  Blood clearing.  CT for Monday AM.    LOS: 7 days    Andreanna Mikolajczak D, MD 08/27/2015, 6:16 AM

## 2015-08-28 NOTE — Progress Notes (Signed)
Patient ID: Lisa RisingRosanna Turazzo, female   DOB: 08/31/1976, 39 y.o.   MRN: [id number] Stable,both ivc working off and on well. Clinically unchanged, no weakness, f/c  Ct head in am

## 2015-08-28 NOTE — Progress Notes (Signed)
Dr. Venetia MaxonStern made aware that Left IVC has had no drainage in the last 3 hours. Neurologically no change noted in patient. Will go to CT in am as planned.

## 2015-08-29 ENCOUNTER — Inpatient Hospital Stay (HOSPITAL_COMMUNITY): Payer: Medicaid Other

## 2015-08-29 ENCOUNTER — Encounter (HOSPITAL_COMMUNITY): Payer: Self-pay | Admitting: Radiology

## 2015-08-29 MED ORDER — LIDOCAINE HCL (PF) 1 % IJ SOLN
INTRAMUSCULAR | Status: AC
Start: 2015-08-29 — End: 2015-08-29
  Filled 2015-08-29: qty 5

## 2015-08-29 MED ORDER — CEFAZOLIN SODIUM-DEXTROSE 2-3 GM-% IV SOLR
2.0000 g | INTRAVENOUS | Status: AC
  Filled 2015-08-29: qty 50

## 2015-08-29 NOTE — Progress Notes (Signed)
Dr Venetia MaxonStern made aware that Left IVC continues to not pulsate after he pulled bilateral IVC drains back. Also made aware that the Right IVC has only put out 1ml of fluid since 1200. Dr Venetia MaxonStern stated that he was not expecting much drainage due to catheters being in the 3rd ventricles and not a lot of CSF is produced there, as long as pt continues to follow commands and maintain at her neuro baseline there is no need to notify MD. Will continue to monitor.

## 2015-08-29 NOTE — Progress Notes (Signed)
IVC's stopped draining well.  I obtained head CT which shows both catheters long.  I pulled them both back 4 cm and sutured them in position.  Patient awake, alert, conversant.  Downgaze persists.  Right IVC pulsatile and patient awake.  Will observe and repeat head CT in am.

## 2015-08-29 NOTE — Progress Notes (Signed)
Dr. Venetia MaxonStern at bedside and pulled back both IVC drains, drains then sutured and dressing reapplied. Dr. Venetia MaxonStern aware that left IVC drain continues to not pulsate. Patient continues to follow commands and converse. Dr. Venetia MaxonStern stated he would be back shortly to reevaluate drain status. Will continue to monitor.

## 2015-08-30 ENCOUNTER — Inpatient Hospital Stay (HOSPITAL_COMMUNITY): Payer: Medicaid Other

## 2015-08-30 ENCOUNTER — Encounter (HOSPITAL_COMMUNITY): Payer: Self-pay | Admitting: Radiology

## 2015-08-30 ENCOUNTER — Encounter (HOSPITAL_COMMUNITY)
Admission: EM | Disposition: A | Discharge status: Home / Self Care | Payer: Self-pay | Source: Home / Self Care | Attending: Neurosurgery

## 2015-08-30 SURGERY — REVISION, SHUNT, VENTRICULOPERITONEAL
Anesthesia: General

## 2015-08-30 MED ORDER — PANTOPRAZOLE SODIUM 40 MG PO TBEC
40.0000 mg | DELAYED_RELEASE_TABLET | Freq: Every day | ORAL | Status: DC
  Administered 2015-08-30 – 2015-09-05 (×7): 40 mg via ORAL
  Filled 2015-08-30 (×7): qty 1

## 2015-08-30 NOTE — Progress Notes (Signed)
Subjective: Patient reports no headache  Objective: Vital signs in last 24 hours: Temp:  [97.7 F (36.5 C)-98.9 F (37.2 C)] 98.9 F (37.2 C) (11/22 0400) Pulse Rate:  [64-90] 75 (11/21 2300) Resp:  [11-21] 12 (11/22 0600) BP: (99-124)/(60-95) 103/67 mmHg (11/22 0600) SpO2:  [97 %-100 %] 99 % (11/21 2300)  Intake/Output from previous day: 11/21 0701 - 11/22 0700 In: 450 [P.O.:300; IV Piggyback:150] Out: 21.5 [Drains:21.5] Intake/Output this shift:    Physical Exam: Still with downgaze, otherwise stable. IVCs pulsatile, but not actively draining.  Lab Results: No results for input(s): WBC, HGB, HCT, PLT in the last 72 hours. BMET No results for input(s): NA, K, CL, CO2, GLUCOSE, BUN, CREATININE, CALCIUM in the last 72 hours.  Studies/Results: Ct Head Wo Contrast  08/30/2015  CLINICAL DATA:  Follow-up hydrocephalus and shunting. EXAM: CT HEAD WITHOUT CONTRAST TECHNIQUE: Contiguous axial images were obtained from the base of the skull through the vertex without intravenous contrast. COMPARISON:  Prior CT from 08/29/2015. FINDINGS: A right parietal approach VP shunt catheter remains in place, seen traversing the lateral ventricles with tip at the left corona radiata. Additional left frontal approach ventricular catheter remains in place as well, although appears to have been retracted with tip now located near the foramen of Monro. Hematoma along the catheter tract in the left frontal lobe measures 1.5 x 2.0 cm, stable. Localized vasogenic edema is similar. Additional right frontal approach ventricular catheter has been retracted with tip now located in the right lateral ventricle. Persistent pneumocephalus with trace subarachnoid hemorrhage along the right frontal approach catheter tract in the right frontal lobe. Intraventricular pneumocephalus and degenerating blood products again seen, overall slightly decreased in volume relative to prior. Ventricular dilatation is also decrease,  with the third ventricle now measuring 9 mm (previously 13 mm). Additionally, temporal horn dilatation is also decreased. Fourth ventricle remains decompressed. Basilar cisterns remain patent. No midline shift. No acute large vessel territory infarct. No extra-axial fluid collection. Trace subarachnoid hemorrhage within the inner but onto the cistern. Probable trace subarachnoid within the right parietal region as well. No acute abnormality about the scalp soft tissues. Globes within normal limits. Calvarium unchanged. Minimal opacity within the sphenoid sinuses bilaterally. Paranasal sinuses are otherwise clear. No mastoid effusion. IMPRESSION: 1. Interval retraction of left frontal approach ventricular catheter with tip now near the foramen of Monro. Stable 2.0 x 1.5 cm hematoma along the left frontal catheter tract. 2. Interval retraction of right frontal approach ventricular catheter with tip now in the right lateral ventricle. 3. Stable right parietal approach VP shunt catheter with tip in the left corona radiata. 4. Decreased hydrocephalus and degenerating intraventricular blood products. 5. Trace subarachnoid hemorrhage in the right frontal and parietal lobes and within the interpeduncular cistern. Electronically Signed   By: Rise Mu M.Johns.   On: 08/30/2015 05:13   Ct Head Wo Contrast  08/29/2015  CLINICAL DATA:  Pre-surgical evaluation, shunt placement. EXAM: CT HEAD WITHOUT CONTRAST TECHNIQUE: Contiguous axial images were obtained from the base of the skull through the vertex without intravenous contrast. COMPARISON:  CT head August 22, 2015 FINDINGS: ventriculoperitoneal shunt via RIGHT parietal burr hole, traversing the lateral ventricles with distal tip at LEFT corona radiata, similar lead position. RIGHT frontal ventriculostomy catheter was present previously, interval advancement with distal tip the level of LEFT pons. Air about the RIGHT frontal ventriculostomy catheter. Interval  placement of LEFT frontal ventriculostomy catheter distal tip at the pre pontine cistern, 15 x 20 mm hematoma  about the proximal catheter tract, with surrounding low-density vasogenic edema. Intraventricular pneumocephalus and degenerating blood products present, the anterior recess of third ventricle is 13 mm, previously 20 mm. No midline shift. No acute large vascular territory infarcts. Basal cisterns are patent. Ocular globes and orbital contents are normal. Frothy secretions sphenoid sinuses without air-fluid levels. The mastoid air cells are well aerated. Old bifrontal, RIGHT parietal burr holes. Frontal scalp soft tissue swelling consistent with skin staples. IMPRESSION: Interval placement of LEFT frontal ventriculostomy catheter, distal tip at the level of pre pontine cistern with small hematoma along the catheter tract. Advanced RIGHT frontal ventriculostomy catheter with distal tip at the level of the pons. Stable appearance of the RIGHT parietal ventriculostomy catheter. Decreasing moderate hydrocephalus. Degenerating intraventricular blood products and pneumocephalus. Electronically Signed   By: Awilda Metroourtnay  Bloomer M.Johns.   On: 08/29/2015 04:46    Assessment/Plan: Patient stable.  Bilateral IVCs well-positioned.  Still too much blood in ventricles to proceed with shunt revision yet.  I suspect other shunt is now working, as ventricular system clearly decompressed.    LOS: 10 days    [name],Lisa Cubero D, MD 08/30/2015, 7:13 AM

## 2015-08-30 NOTE — Care Management Note (Signed)
Case Management Note  Patient Details  Name: Uvaldo RisingRosanna Turazzo MRN: [id number] Date of Birth: 06/05/1976  Subjective/Objective:                    Action/Plan:   Expected Discharge Date:                  Expected Discharge Plan:  Home w Home Health Services  In-House Referral:     Discharge planning Services  CM Consult  Post Acute Care Choice:    Choice offered to:     DME Arranged:    DME Agency:     HH Arranged:    HH Agency:     Status of Service:  In process, will continue to follow  Medicare Important Message Given:    Date Medicare IM Given:    Medicare IM give by:    Date Additional Medicare IM Given:    Additional Medicare Important Message give by:     If discussed at Long Length of Stay Meetings, dates discussed:    Additional Comments:  Glennon Macmerson, Tanith Dagostino M, RN 08/30/2015, 12:01 PM

## 2015-08-31 ENCOUNTER — Other Ambulatory Visit: Payer: Self-pay | Admitting: Neurosurgery

## 2015-08-31 NOTE — Progress Notes (Signed)
Subjective: Patient reports "sta bene" no headache or complaints  Objective: Vital signs in last 24 hours: Temp:  [97.8 F (36.6 C)-98.9 F (37.2 C)] 98.7 F (37.1 C) (11/23 0400) Pulse Rate:  [60-95] 60 (11/23 0500) Resp:  [8-21] 14 (11/23 0500) BP: (65-114)/(49-74) 107/68 mmHg (11/23 0500) SpO2:  [95 %-99 %] 96 % (11/23 0500)  Intake/Output from previous day: 11/22 0701 - 11/23 0700 In: 660 [P.O.:560; IV Piggyback:100] Out: 0  Intake/Output this shift:    Physical Exam: Awake, alert, conversant.  MAEW.  Still with downgaze, but improved this am.  Left IVC has not drained in 2 days, right last drained 2 days ago.  Lab Results: No results for input(s): WBC, HGB, HCT, PLT in the last 72 hours. BMET No results for input(s): NA, K, CL, CO2, GLUCOSE, BUN, CREATININE, CALCIUM in the last 72 hours.  Studies/Results: Ct Head Wo Contrast  08/30/2015  CLINICAL DATA:  Follow-up hydrocephalus and shunting. EXAM: CT HEAD WITHOUT CONTRAST TECHNIQUE: Contiguous axial images were obtained from the base of the skull through the vertex without intravenous contrast. COMPARISON:  Prior CT from 08/29/2015. FINDINGS: A right parietal approach VP shunt catheter remains in place, seen traversing the lateral ventricles with tip at the left corona radiata. Additional left frontal approach ventricular catheter remains in place as well, although appears to have been retracted with tip now located near the foramen of Monro. Hematoma along the catheter tract in the left frontal lobe measures 1.5 x 2.0 cm, stable. Localized vasogenic edema is similar. Additional right frontal approach ventricular catheter has been retracted with tip now located in the right lateral ventricle. Persistent pneumocephalus with trace subarachnoid hemorrhage along the right frontal approach catheter tract in the right frontal lobe. Intraventricular pneumocephalus and degenerating blood products again seen, overall slightly decreased in  volume relative to prior. Ventricular dilatation is also decrease, with the third ventricle now measuring 9 mm (previously 13 mm). Additionally, temporal horn dilatation is also decreased. Fourth ventricle remains decompressed. Basilar cisterns remain patent. No midline shift. No acute large vessel territory infarct. No extra-axial fluid collection. Trace subarachnoid hemorrhage within the inner but onto the cistern. Probable trace subarachnoid within the right parietal region as well. No acute abnormality about the scalp soft tissues. Globes within normal limits. Calvarium unchanged. Minimal opacity within the sphenoid sinuses bilaterally. Paranasal sinuses are otherwise clear. No mastoid effusion. IMPRESSION: 1. Interval retraction of left frontal approach ventricular catheter with tip now near the foramen of Monro. Stable 2.0 x 1.5 cm hematoma along the left frontal catheter tract. 2. Interval retraction of right frontal approach ventricular catheter with tip now in the right lateral ventricle. 3. Stable right parietal approach VP shunt catheter with tip in the left corona radiata. 4. Decreased hydrocephalus and degenerating intraventricular blood products. 5. Trace subarachnoid hemorrhage in the right frontal and parietal lobes and within the interpeduncular cistern. Electronically Signed   By: Rise MuBenjamin  McClintock M.D.   On: 08/30/2015 05:13    Assessment/Plan: Patient improving neurologically.  I have clamped left IVC and put right to 20 cm.  Patient appears to be internally decompressing ventricular system without functioning shunt.  We will try to remove IVCs and see if she can go without a shunt.    LOS: 11 days    Dorian HeckleSTERN,Perri Aragones D, MD 08/31/2015, 8:04 AM

## 2015-09-01 NOTE — Progress Notes (Signed)
Patient ID: Lisa RisingRosanna Johns, female   DOB: 06/04/1976, 39 y.o.   MRN: [id number] Patient denies headache. She is moving all extremities. She is awake and alert. She answers questions appropriately. Gaze appears disconjugate? Right ventriculostomy is patent and pulsatile with an ICP of 0. Minimal output. Left ventriculostomy is clamped. Repeat head CT tomorrow. If stable may consider removing left catheter.

## 2015-09-02 ENCOUNTER — Inpatient Hospital Stay (HOSPITAL_COMMUNITY): Payer: Medicaid Other

## 2015-09-02 NOTE — Progress Notes (Signed)
Patient ID: [name], female   DOB: 05/23/1976, 39 y.o.   MRN: [id number] Patient awake and alert. Follows commands and answers questions appropriately. CT scan stable. I removed her left ventricular catheter today. We will clamp the right ventricular catheter and if she remains stable we'll remove that tomorrow. She has had minimal output over a couple of days. Neurologic exam remains stable.

## 2015-09-03 ENCOUNTER — Inpatient Hospital Stay (HOSPITAL_COMMUNITY): Payer: Medicaid Other

## 2015-09-03 MED ORDER — ALUM & MAG HYDROXIDE-SIMETH 200-200-20 MG/5ML PO SUSP
15.0000 mL | Freq: Four times a day (QID) | ORAL | Status: DC | PRN
  Administered 2015-09-03: 15 mL via ORAL
  Filled 2015-09-03: qty 30

## 2015-09-03 NOTE — Progress Notes (Signed)
Patient ID: [name], female   DOB: 03/12/1976, 39 y.o.   MRN: [id number] Patient is stable. Her left ventriculostomy drain has been out for 24 hours. Her right ventriculostomy drain has been clamped for 24 hours and has had minimal output for more than 2 days. Her ICP remains low. Therefore removed her second ventriculostomy drain. She arouses easily answers questions and follows commands.

## 2015-09-04 NOTE — Progress Notes (Signed)
Subjective: Patient reports Feeling better this morning no headache no nausea  Objective: Vital signs in last 24 hours: Temp:  [98 F (36.7 C)-99.1 F (37.3 C)] 98 F (36.7 C) (11/27 0759) Pulse Rate:  [67-84] 67 (11/27 0500) Resp:  [13-22] 13 (11/27 0500) BP: (90-113)/(53-89) 98/60 mmHg (11/27 0500) SpO2:  [94 %-100 %] 97 % (11/27 0500)  Intake/Output from previous day: 11/26 0701 - 11/27 0700 In: 660 [P.O.:540; I.V.:70; IV Piggyback:50] Out: -  Intake/Output this shift:    Awake alert and follows commands no pronator drift does have a perinauds that apparently is her baseline.  Lab Results: No results for input(s): WBC, HGB, HCT, PLT in the last 72 hours. BMET No results for input(s): NA, K, CL, CO2, GLUCOSE, BUN, CREATININE, CALCIUM in the last 72 hours.  Studies/Results: Dg Abd Portable 1v  09/03/2015  CLINICAL DATA:  Right abdominal pain EXAM: PORTABLE ABDOMEN - 1 VIEW COMPARISON:  08/21/2015 CT abdomen/pelvis FINDINGS: Right state catheter loops in the right upper quadrant of the abdomen, courses into the pelvis, loops within the pelvis in terminates within the pelvis, with no definite kink or discontinuity. No disproportionately dilated small bowel loops. Mild stool throughout the colon and rectum. No evidence of pneumatosis or pneumoperitoneum. IMPRESSION: Nonobstructive bowel gas pattern. Mild colorectal stool volume. No pneumoperitoneum. No definite kink or discontinuity in the right VP shunt, which terminates in the pelvis. Electronically Signed   By: Delbert PhenixJason A Poff M.D.   On: 09/03/2015 13:08    Assessment/Plan: Continue observation CT scan in the morning  LOS: 15 days     Brit Wernette P 09/04/2015, 8:11 AM

## 2015-09-05 ENCOUNTER — Encounter (HOSPITAL_COMMUNITY): Payer: Self-pay | Admitting: Anesthesiology

## 2015-09-05 ENCOUNTER — Inpatient Hospital Stay (HOSPITAL_COMMUNITY): Payer: Medicaid Other

## 2015-09-05 MED ORDER — CEFAZOLIN SODIUM-DEXTROSE 2-3 GM-% IV SOLR: 2.0000 g | INTRAVENOUS | Status: DC

## 2015-09-05 NOTE — Progress Notes (Signed)
Subjective: Patient reports "I feel good"  Objective: Vital signs in last 24 hours: Temp:  [98.1 F (36.7 C)-99.5 F (37.5 C)] 98.9 F (37.2 C) (11/28 0755) Pulse Rate:  [65-88] 65 (11/28 0700) Resp:  [11-20] 13 (11/28 0700) BP: (81-100)/(46-75) 89/58 mmHg (11/28 0700) SpO2:  [90 %-98 %] 98 % (11/28 0700)  Intake/Output from previous day: 11/27 0701 - 11/28 0700 In: 850 [P.O.:600; IV Piggyback:250] Out: -  Intake/Output this shift:    Awakens to voice. MAEW. Reports no headache, no other discomfort. No drift. Downward gaze persists, but follows otherwise. CT this am stable.   Lab Results: No results for input(s): WBC, HGB, HCT, PLT in the last 72 hours. BMET No results for input(s): NA, K, CL, CO2, GLUCOSE, BUN, CREATININE, CALCIUM in the last 72 hours.  Studies/Results: Ct Head Wo Contrast  09/05/2015  CLINICAL DATA:  Ventriculostomy catheter clamped for 24 hours. Low ICP. History of chronic headaches. EXAM: CT HEAD WITHOUT CONTRAST TECHNIQUE: Contiguous axial images were obtained from the base of the skull through the vertex without intravenous contrast. COMPARISON:  CT head September 02, 2015 FINDINGS: Interval removal of bifrontal ventriculostomy catheters. Stable position of RIGHT ventriculoperitoneal shunt, with distal tip in LEFT periventricular white matter. Degenerating intraventricular blood products. Third ventricle is 10 mm in transaxial dimension, slightly larger than prior examination. However, temporal horns are decompressed. Bifrontal pneumocephalus, and degenerating LEFT frontal hematoma along the catheter track, with surrounding low-density vasogenic edema. No midline shift. No acute large vascular territory infarct. Basal cisterns are patent. No abnormal extra-axial fluid collections. Ocular globes and orbital contents are normal. Minimal secretions in bilateral sphenoid sinuses. The mastoid air cells are well aerated. Multiple old frontal burr holes. IMPRESSION:  Interval removal of bifrontal ventriculostomy catheters, with frontal pneumocephalus and degenerating LEFT frontal hematoma. Stable appearance of RIGHT ventriculoperitoneal catheter. Mildly enlarging third ventricle, with degenerating in intraventricular blood products. Electronically Signed   By: Awilda Metroourtnay  Bloomer M.D.   On: 09/05/2015 03:25   Dg Abd Portable 1v  09/03/2015  CLINICAL DATA:  Right abdominal pain EXAM: PORTABLE ABDOMEN - 1 VIEW COMPARISON:  08/21/2015 CT abdomen/pelvis FINDINGS: Right state catheter loops in the right upper quadrant of the abdomen, courses into the pelvis, loops within the pelvis in terminates within the pelvis, with no definite kink or discontinuity. No disproportionately dilated small bowel loops. Mild stool throughout the colon and rectum. No evidence of pneumatosis or pneumoperitoneum. IMPRESSION: Nonobstructive bowel gas pattern. Mild colorectal stool volume. No pneumoperitoneum. No definite kink or discontinuity in the right VP shunt, which terminates in the pelvis. Electronically Signed   By: Delbert PhenixJason A Poff M.D.   On: 09/03/2015 13:08    Assessment/Plan: improving   LOS: 16 days  Continue support.    Georgiann Cockeroteat, Brian 09/05/2015, 9:04 AM    Patient is doing well.  Occular exam stable and patient says "this is normal."  Tx to floor.  Mobilize.

## 2015-09-05 NOTE — Progress Notes (Signed)
Pt's belongings packed up, including purple IPad and charger.  Belongings went with patient upon transfer.

## 2015-09-05 NOTE — Progress Notes (Signed)
Patient transferred from 72M around 1330 alert and oriented with husband she is sitting in chair beside bed no complaints of pain will continue to monitor

## 2015-09-06 ENCOUNTER — Encounter (HOSPITAL_COMMUNITY): Payer: Self-pay | Admitting: Anesthesiology

## 2015-09-06 ENCOUNTER — Encounter (HOSPITAL_COMMUNITY)
Admission: EM | Disposition: A | Discharge status: Home / Self Care | Payer: Self-pay | Source: Home / Self Care | Attending: Neurosurgery

## 2015-09-06 SURGERY — REVISION, SHUNT, VENTRICULOPERITONEAL
Anesthesia: General

## 2015-09-06 MED ORDER — PROPOFOL 10 MG/ML IV BOLUS
INTRAVENOUS | Status: AC
  Filled 2015-09-06: qty 20

## 2015-09-06 MED ORDER — LIDOCAINE HCL (CARDIAC) 20 MG/ML IV SOLN
INTRAVENOUS | Status: AC
  Filled 2015-09-06: qty 5

## 2015-09-06 MED ORDER — FENTANYL CITRATE (PF) 250 MCG/5ML IJ SOLN
INTRAMUSCULAR | Status: AC
  Filled 2015-09-06: qty 5

## 2015-09-06 MED ORDER — TRAMADOL HCL 50 MG PO TABS: 50.0000 mg | ORAL_TABLET | Freq: Four times a day (QID) | ORAL | Status: DC | PRN

## 2015-09-06 NOTE — Evaluation (Signed)
Physical Therapy Evaluation Patient Details Name: [name] MRN: [id number] DOB: July 24, 1976 Today's Date: 09/06/2015   History of Present Illness  Adm 11/12 with confusion: Obstructive hydrocephalus with shunt malfunction, s/p ventriculostomies,11/12 and 11/14. 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 Patient slightly unsteady (did not require external assist to recover and per pt, this was her first time walking since admitted). Patient confirmed over phone that husband can be off work x 3 days to provide 24/7 assist. Patient appears to have good safety awareness and awareness of her deficits as she offered, "I will not go on the stairs unless my husband is home." Contacted OT re: likely d/c home today. All education has been completed and the patient has no further questions.  PT is signing off. Thank you for this referral.     Follow Up Recommendations No PT follow up (anticipate spontaneous recovery with incr activity); Supervision for mobility/OOB    Equipment Recommendations  None recommended by PT    Recommendations for Other Services OT consult     Precautions / Restrictions Precautions Precautions: Fall      Mobility  Bed Mobility Overal bed mobility: Modified Independent             General bed mobility comments: sitting EOB on arrival with [facility] elevated  Transfers Overall transfer level: Needs assistance Equipment used: None Transfers: Sit to/from Stand Sit to Stand: Min guard;Supervision         General transfer comment: x4; initial unsteadiness, improving with practice (and when not rushing to get to bathroom)  Ambulation/Gait Ambulation/Gait assistance: Min guard Ambulation Distance (Feet): 400 Feet Assistive device: None (vs initially IV pole) Gait Pattern/deviations: Step-through pattern;Decreased stride length;Drifts right/left;Wide base of support Gait velocity: able to  vary well Gait velocity interpretation: at or above normal speed for age/gender General Gait Details: pt initially more unsteady (also rushing to restroom and held IV pole with Lt hand); gait progressively improved, although drifting lt and rt, with occasional stagger, however never required external support or assist to recover.  Stairs Stairs: Yes Stairs assistance: Min guard Stair Management: One rail Right;Alternating pattern;Forwards Number of Stairs: 4 General stair comments: limited # due to IV; pt steady, minguard for safety  Wheelchair Mobility    Modified Rankin (Stroke Patients Only)       Balance Overall balance assessment: Needs assistance         Standing balance support: No upper extremity supported Standing balance-Leahy Scale: Good               High level balance activites: Direction changes;Turns;Sudden stops;Head turns High Level Balance Comments: surprisingly maintained straight path during head turns Lt and Rt, quick turn, quick stop             Pertinent Vitals/Pain Pain Assessment: No/denies pain    Home Living Family/patient expects to be discharged to:: Private residence Living Arrangements: Spouse/significant other Available Help at Discharge: Family;Available 24 hours/day (x 3 days) Type of Home: Other(Comment) (townhouse) Home Access: Stairs to enter Entrance Stairs-Rails: Right Entrance Stairs-Number of Steps: 6 Home Layout: Two level;1/2 bath on main level;Bed/bath upstairs Home Equipment: Walker - 2 wheels;Shower seat - built in;Grab bars - toilet      Prior Function Level of Independence: Independent               Hand Dominance        Extremity/Trunk Assessment   Upper Extremity Assessment: Defer to  OT evaluation;Overall WFL for tasks assessed           Lower Extremity Assessment: Generalized weakness      Cervical / Trunk Assessment: Other exceptions  Communication   Communication: No difficulties   Cognition Arousal/Alertness: Awake/alert Behavior During Therapy: Impulsive (however trying to get to bathroom) Overall Cognitive Status: Within Functional Limits for tasks assessed (only off date by 1 day, otherwise completely intact)                      General Comments General comments (skin integrity, edema, etc.): Pt reports dysconjugate gaze and incontinence present PTA. Denies double-vision PA entered during session and discussed likely d/c home today. Pt able to call husband via hospital phone to determine how long he can stay at home with her 24/7 (3 days)    Exercises        Assessment/Plan    PT Assessment All further PT needs can be met in the next venue of care  PT Diagnosis Difficulty walking   PT Problem List Decreased balance;Decreased knowledge of use of DME;Decreased strength  PT Treatment Interventions     PT Goals (Current goals can be found in the Care Plan section) Acute Rehab PT Goals Patient Stated Goal: go home PT Goal Formulation: All assessment and education complete, DC therapy    Frequency     Barriers to discharge        Co-evaluation               End of Session Equipment Utilized During Treatment: Gait belt Activity Tolerance: Patient tolerated treatment well Patient left: in chair;with call bell/phone within reach;with chair alarm set Nurse Communication: Mobility status         Time: 1281-1886 PT Time Calculation (min) (ACUTE ONLY): 35 min   Charges:   PT Evaluation $Initial PT Evaluation Tier I: 1 Procedure PT Treatments $Gait Training: 8-22 mins   PT G Codes:        Vinny Taranto 09/17/15, 9:41 AM Pager [phone]

## 2015-09-06 NOTE — Care Management Note (Signed)
Case Management Note  Patient Details  Name: Uvaldo RisingRosanna Turazzo MRN: [id number] Date of Birth: 07/10/1976  Subjective/Objective:                    Action/Plan: Patient being discharged home today with self care. Patient states her husband can provide 24/7 care for the next 3 days. No home needs per MD or PT/OT. No further needs per CM.   Expected Discharge Date:                  Expected Discharge Plan:  Home/Self Care  In-House Referral:     Discharge planning Services  CM Consult  Post Acute Care Choice:    Choice offered to:     DME Arranged:    DME Agency:     HH Arranged:    HH Agency:     Status of Service:  Completed, signed off  Medicare Important Message Given:    Date Medicare IM Given:    Medicare IM give by:    Date Additional Medicare IM Given:    Additional Medicare Important Message give by:     If discussed at Long Length of Stay Meetings, dates discussed:    Additional Comments:  Kermit BaloKelli F Rain Wilhide, RN 09/06/2015, 10:50 AM

## 2015-09-06 NOTE — Progress Notes (Signed)
Patient ID: [name], female   DOB: 08/05/1976, [age] y.o.   MRN: [id number] Patient will call her husband. If he is available for 24hr care & supervision at home, per DrStern, pt may go home today. Order entered. Pt agrees to follow up in office next week for suture/staple removal and evaluation.   Georgiann CockerBrian Atzel Mccambridge RN BSN

## 2015-09-06 NOTE — Progress Notes (Signed)
Discharge instructions reviewed with a patient/family. No questions or concerns at this time. Advise family/patient to call MD for follow-up. No RX given. Transport by family.   Sim BoastHavy, RN

## 2015-09-06 NOTE — Progress Notes (Signed)
Subjective: Patient reports "I'm good"  Objective: Vital signs in last 24 hours: Temp:  [98.5 F (36.9 C)-98.9 F (37.2 C)] 98.6 F (37 C) (11/29 0500) Pulse Rate:  [70-101] 70 (11/29 0500) Resp:  [15-26] 20 (11/29 0500) BP: (69-156)/(51-127) 90/54 mmHg (11/29 0500) SpO2:  [92 %-100 %] 94 % (11/29 0500)  Intake/Output from previous day: 11/28 0701 - 11/29 0700 In: 246 [P.O.:240; I.V.:6] Out: -  Intake/Output this shift:    Alert, conversant, reporting no headache, no problems. MAEW. No drift. Downward gaze, but follows. (baseline?) Scalp incisions without erythema, swelling, or drainage.  Lab Results: No results for input(s): WBC, HGB, HCT, PLT in the last 72 hours. BMET No results for input(s): NA, K, CL, CO2, GLUCOSE, BUN, CREATININE, CALCIUM in the last 72 hours.  Studies/Results: Ct Head Wo Contrast  09/05/2015  CLINICAL DATA:  Ventriculostomy catheter clamped for 24 hours. Low ICP. History of chronic headaches. EXAM: CT HEAD WITHOUT CONTRAST TECHNIQUE: Contiguous axial images were obtained from the base of the skull through the vertex without intravenous contrast. COMPARISON:  CT head September 02, 2015 FINDINGS: Interval removal of bifrontal ventriculostomy catheters. Stable position of RIGHT ventriculoperitoneal shunt, with distal tip in LEFT periventricular white matter. Degenerating intraventricular blood products. Third ventricle is 10 mm in transaxial dimension, slightly larger than prior examination. However, temporal horns are decompressed. Bifrontal pneumocephalus, and degenerating LEFT frontal hematoma along the catheter track, with surrounding low-density vasogenic edema. No midline shift. No acute large vascular territory infarct. Basal cisterns are patent. No abnormal extra-axial fluid collections. Ocular globes and orbital contents are normal. Minimal secretions in bilateral sphenoid sinuses. The mastoid air cells are well aerated. Multiple old frontal burr holes.  IMPRESSION: Interval removal of bifrontal ventriculostomy catheters, with frontal pneumocephalus and degenerating LEFT frontal hematoma. Stable appearance of RIGHT ventriculoperitoneal catheter. Mildly enlarging third ventricle, with degenerating in intraventricular blood products. Electronically Signed   By: Awilda Metroourtnay  Bloomer M.D.   On: 09/05/2015 03:25    Assessment/Plan: Improving   LOS: 17 days  Continue to mobilize.    Georgiann Cockeroteat, Efton Thomley 09/06/2015, 7:52 AM

## 2015-09-06 NOTE — Progress Notes (Signed)
Occupational Therapy Evaluation Patient Details Name: Lisa RisingRosanna Turazzo MRN: [id number] DOB: 11/09/1975 Today's Date: 09/06/2015    History of Present Illness Adm 11/12 with confusion: Obstructive hydrocephalus with shunt malfunction, s/p ventriculostomies,11/12 and 11/14. 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 Pt reported to PT that she did not currently have diplopia, that she was having incontinence and vision changes PTA, and that she had DME at home. However, pt reported to OT that she is currently having diplopia all the time (unless one eye covered) and that incontinence and visual changes occurred after coming to the hospital, and she does not have any DME at home. Pt was unsteady upon standing and while ambulating throughout session and required verbal and tactile cues to not lean backwards. Recommending RW for safety due to pt's decreased balance. Recommending Neuro Outpatient OT to follow up with diplopia and depth perception difficulties to increase safety and independence at home.     Follow Up Recommendations  Outpatient OT;Supervision/Assistance - 24 hour (Neuro)    Equipment Recommendations  Other (comment) (RW-2 wheeled)    Recommendations for Other Services       Precautions / Restrictions Precautions Precautions: Fall Restrictions Weight Bearing Restrictions: No      Mobility Bed Mobility Overal bed mobility: Modified Independent             General bed mobility comments: Pt received in chair on arrival  Transfers Overall transfer level: Needs assistance Equipment used: None Transfers: Sit to/from Stand Sit to Stand: Min guard         General transfer comment: Pt completed sit-stand transfer x4 with inital unsteadiness each time    Balance Overall balance assessment: Needs assistance Sitting-balance support: No upper extremity  supported;Feet supported Sitting balance-Leahy Scale: Good Sitting balance - Comments: Able to complete LB ADL without LOB    Standing balance support: No upper extremity supported;During functional activity Standing balance-Leahy Scale: Fair Standing balance comment: Pt with posterior lean upon standing and unsteady during static and dynamic standing balance tasks.             High level balance activites: Direction changes;Turns;Sudden stops;Head turns High Level Balance Comments: surprisingly maintained straight path during head turns Lt and Rt, quick turn, quick stop            ADL Overall ADL's : Needs assistance/impaired     Grooming: Wash/dry hands;Min guard;Standing   Upper Body Bathing: Supervision/ safety;Sitting   Lower Body Bathing: Min guard;Sit to/from stand   Upper Body Dressing : Supervision/safety;Set up;Sitting   Lower Body Dressing: Supervision/safety;Set up;Sit to/from stand   Toilet Transfer: Min guard;Cueing for safety;Ambulation;Regular Toilet;Grab bars Toilet Transfer Details (indicate cue type and reason): Verbal and tactile cues for posterior lean Toileting- Clothing Manipulation and Hygiene: Min guard;Cueing for safety;Sit to/from stand Toileting - Clothing Manipulation Details (indicate cue type and reason): Verbal and tactile cues for posterior lean Tub/ Shower Transfer: Min guard;Ambulation   Functional mobility during ADLs: Min guard;Cueing for safety General ADL Comments: Pt reports having diplopia at all times since hospital stay, but reported not having any to PT. Pt demonstrated some impulsivity during transfers and ambulation and required verbal cues to slow down. Pt unsteady upon standing and during ambulation and had a posterior lean. Pt was incontinent during both OT and PT sessions and reported this did not start until after her surgery. Pt required min guard assist  for mobility and supervision for ADL due to decreased balance during  these tasks.       Vision Vision Assessment?: Yes Eye Alignment: Impaired (comment) (Dysconjugate gaze) Ocular Range of Motion: Restricted on the right;Restricted on the left;Restricted looking up;Restricted looking down Alignment/Gaze Preference: Head tilt (upward) Tracking/Visual Pursuits: Right eye does not track laterally;Left eye does not track medially;Decreased smoothness of horizontal tracking;Decreased smoothness of vertical tracking;Other (comment);Decreased smoothness of eye movement to RIGHT inferior field;Decreased smoothness of eye movement to LEFT inferior field (No superior field tracking of either eye) Diplopia Assessment: Objects split side to side;Present all the time/all directions;Disappears with one eye closed Depth Perception: Undershoots   Perception     Praxis      Pertinent Vitals/Pain Pain Assessment: No/denies pain     Hand Dominance Right   Extremity/Trunk Assessment Upper Extremity Assessment Upper Extremity Assessment: RUE deficits/detail;LUE deficits/detail RUE Deficits / Details: triceps 4/5 LUE Deficits / Details: triceps 4/5   Lower Extremity Assessment Lower Extremity Assessment: Generalized weakness;Defer to PT evaluation   Cervical / Trunk Assessment Cervical / Trunk Assessment: Other exceptions Cervical / Trunk Exceptions: generalized weakness   Communication Communication Communication: No difficulties   Cognition Arousal/Alertness: Awake/alert Behavior During Therapy: Impulsive Overall Cognitive Status: Within Functional Limits for tasks assessed                     General Comments       Exercises       Shoulder Instructions      Home Living Family/patient expects to be discharged to:: Private residence Living Arrangements: Spouse/significant other Available Help at Discharge: Available 24 hours/day;Family (3 days/wk) Type of Home: Other(Comment) (townhouse) Home Access: Stairs to enter [employer]  of Steps: 6 Entrance Stairs-Rails: Right Home Layout: Two level;1/2 bath on main level;Bed/bath upstairs Alternate Level Stairs-Number of Steps: 13 Alternate Level Stairs-Rails: Right Bathroom Shower/Tub: Producer, television/film/video: Standard Bathroom Accessibility: No   Home Equipment: None   Additional Comments: Pt stated that her husband only works Museum/gallery exhibitions officer. Pt said she does not have RW or built-in shower seat as reported by PT      Prior Functioning/Environment Level of Independence: Independent             OT Diagnosis: Disturbance of vision   OT Problem List: Decreased strength;Impaired vision/perception;Decreased safety awareness;Decreased knowledge of use of DME or AE   OT Treatment/Interventions: Self-care/ADL training;DME and/or AE instruction;Therapeutic activities;Visual/perceptual remediation/compensation;Balance training;Patient/family education    OT Goals(Current goals can be found in the care plan section) Acute Rehab OT Goals Patient Stated Goal: to go home OT Goal Formulation: With patient Time For Goal Achievement: 09/20/15 Potential to Achieve Goals: Good ADL Goals Additional ADL Goal #1: Pt will independently perform diplopia home exercise program to increase safety and indepence with ADL and mobility.  OT Frequency: Min 2X/week   Barriers to D/C: Decreased caregiver support  Pt's home alone Fri-Sun while husband at work       Co-evaluation              End of Session Equipment Utilized During Treatment: Stage manager Communication: Mobility status;Other (comment) (Diplopia)  Activity Tolerance: Patient tolerated treatment well Patient left: in chair;with call bell/phone within reach;with chair alarm set   Time: 1010-1042 OT Time Calculation (min): 32 min Charges:  OT General Charges $OT Visit: 1 Procedure OT Evaluation $Initial OT Evaluation Tier I: 1 Procedure OT Treatments $Self Care/Home Management : 8-22 mins G-Codes:  Nils Pyle, OTR/L 09/06/2015, 11:20 AM

## 2015-09-06 NOTE — Discharge Summary (Signed)
Physician Discharge Summary  Patient ID: [name] MRN: [id number] DOB/AGE: 41/10/1975 41 y.o.  Admit date: 08/20/2015 Discharge date: 09/06/2015  Admission Diagnoses: Obstructive hydrocephalus with shunt malfunction  Discharge Diagnoses: Obstructive hydrocephalus with shunt malfunction, s/p ventriculostomies, now self-draining Active Problems:   Malfunction of ventriculo-peritoneal shunt (HCC)   Obstructive hydrocephalus   Discharged Condition: good  Hospital Course: [name] was admitted through ED by Dr. Franky Machoabbell for shunt malfunction 08-20-15.  Placement of right frontal ventricular drain allowed improvement of LOC. Decreased LOC 08-22-15 prompted placement of left ventricular drain and repositioning of right.  LOC improved once again. Pt was monitored in Neuro ICU and consideration was given for shunt revision, but delayed given persistent bloody CSF. Improvement of symptoms coupled with decreased output, allowed for removal of drains  11/25 & 11/26.  She has continued to improve with therapies.   Consults: none  Significant Diagnostic Studies:   Treatments: surgery: right & left ventricular drains placed  Discharge Exam: Blood pressure 90/54, pulse 70, temperature 98.6 F (37 C), temperature source Oral, resp. rate 20, height 5\' 6"  (1.676 m), weight 63.8 kg (140 lb 10.5 oz), SpO2 94 %. Alert, conversant, reporting no headache, no problems. MAEW. No drift. Downward gaze, but follows. (baseline?) Scalp incisions without erythema, swelling, or drainage.   Disposition: Discharge to home.  Office follow up next week for suture & staple removal.      Medication List    TAKE these medications        traMADol 50 MG tablet  Commonly known as:  ULTRAM  1-2 every 4 hours as needed for cough or pain     traMADol 50 MG tablet  Commonly known as:  ULTRAM  Take 1 tablet (50 mg total) by mouth every 6 (six) hours as needed for moderate pain.          Signed: Georgiann Cockeroteat, Camillia Marcy 09/06/2015, 9:01 AM

## 2015-09-16 ENCOUNTER — Other Ambulatory Visit: Payer: Self-pay | Admitting: Neurosurgery

## 2015-09-16 DIAGNOSIS — Q039 Congenital hydrocephalus, unspecified: Principal | ICD-10-CM

## 2015-09-16 DIAGNOSIS — Q030 Malformations of aqueduct of Sylvius: Secondary | ICD-10-CM

## 2015-10-11 ENCOUNTER — Ambulatory Visit
Admission: RE | Admit: 2015-10-11 | Discharge: 2015-10-11 | Disposition: A | Discharge status: Home / Self Care | Payer: Medicaid Other | Source: Ambulatory Visit | Attending: Neurosurgery | Admitting: Neurosurgery

## 2015-10-11 DIAGNOSIS — Q030 Malformations of aqueduct of Sylvius: Secondary | ICD-10-CM

## 2015-10-11 DIAGNOSIS — Q039 Congenital hydrocephalus, unspecified: Principal | ICD-10-CM

## 2016-09-02 ENCOUNTER — Encounter (HOSPITAL_COMMUNITY): Payer: Self-pay

## 2016-09-02 ENCOUNTER — Emergency Department (HOSPITAL_COMMUNITY): Payer: Medicaid Other

## 2016-09-02 ENCOUNTER — Emergency Department (HOSPITAL_COMMUNITY)
Admission: EM | Admit: 2016-09-02 | Discharge: 2016-09-02 | Disposition: A | Discharge status: Home / Self Care | Payer: Medicaid Other | Attending: Emergency Medicine | Admitting: Emergency Medicine

## 2016-09-02 DIAGNOSIS — R51 Headache: Secondary | ICD-10-CM | POA: Insufficient documentation

## 2016-09-02 DIAGNOSIS — G8929 Other chronic pain: Secondary | ICD-10-CM | POA: Diagnosis not present

## 2016-09-02 DIAGNOSIS — R519 Headache, unspecified: Secondary | ICD-10-CM

## 2016-09-02 DIAGNOSIS — R400 Somnolence: Secondary | ICD-10-CM | POA: Diagnosis not present

## 2016-09-02 LAB — CBC WITH DIFFERENTIAL/PLATELET
BASOS ABS: 0 10*3/uL (ref 0.0–0.1)
Basophils Relative: 0 %
EOS ABS: 0.1 10*3/uL (ref 0.0–0.7)
EOS PCT: 1 %
HCT: 43.3 % (ref 36.0–46.0)
Hemoglobin: 14.5 g/dL (ref 12.0–15.0)
LYMPHS PCT: 22 %
Lymphs Abs: 2 10*3/uL (ref 0.7–4.0)
MCH: 28.8 pg (ref 26.0–34.0)
MCHC: 33.5 g/dL (ref 30.0–36.0)
MCV: 86.1 fL (ref 78.0–100.0)
MONO ABS: 0.6 10*3/uL (ref 0.1–1.0)
Monocytes Relative: 7 %
Neutro Abs: 6.4 10*3/uL (ref 1.7–7.7)
Neutrophils Relative %: 70 %
PLATELETS: 194 10*3/uL (ref 150–400)
RBC: 5.03 MIL/uL (ref 3.87–5.11)
RDW: 13.7 % (ref 11.5–15.5)
WBC: 9.1 10*3/uL (ref 4.0–10.5)

## 2016-09-02 LAB — BASIC METABOLIC PANEL
Anion gap: 8 (ref 5–15)
BUN: 9 mg/dL (ref 6–20)
CO2: 23 mmol/L (ref 22–32)
CREATININE: 0.75 mg/dL (ref 0.44–1.00)
Calcium: 9.1 mg/dL (ref 8.9–10.3)
Chloride: 108 mmol/L (ref 101–111)
GFR calc Af Amer: >60 mL/min (ref >60)
GLUCOSE: 104 mg/dL — AB (ref 65–99)
Potassium: 3 mmol/L — ABNORMAL LOW (ref 3.5–5.1)
SODIUM: 139 mmol/L (ref 135–145)

## 2016-09-02 MED ORDER — ONDANSETRON HCL 4 MG/2ML IJ SOLN
4.0000 mg | Freq: Once | INTRAMUSCULAR | Status: AC
  Administered 2016-09-02: 4 mg via INTRAVENOUS
  Filled 2016-09-02: qty 2

## 2016-09-02 MED ORDER — MORPHINE SULFATE (PF) 4 MG/ML IV SOLN
4.0000 mg | Freq: Once | INTRAVENOUS | Status: AC
  Administered 2016-09-02: 4 mg via INTRAVENOUS
  Filled 2016-09-02: qty 1

## 2016-09-02 NOTE — Discharge Instructions (Signed)
Your CT scan today shows that your shunt is working properly. Should headache come back, take acetaminophen or ibuprofen. Return to the ED if you're having any problems.

## 2016-09-02 NOTE — ED Notes (Signed)
Dr. Glick at the bedside.  

## 2016-09-02 NOTE — ED Notes (Signed)
Pt here for headache and collapse reported while sleeping.husband is stating that she needs a ct because this happens all the time due to her shunt in her brain.

## 2016-09-02 NOTE — ED Triage Notes (Signed)
Pt here for headache and reports while sweeping had a collapse has a hx of shunt in brain.

## 2016-09-02 NOTE — ED Provider Notes (Signed)
[facility]-EMERGENCY DEPT Provider Note   CSN: [id number] Arrival date & time: 09/02/16  [id number]  By signing my name below, I, Soijett Blue, attest that this documentation has been prepared under the direction and in the presence of Dione Boozeavid Angeles Paolucci, MD. Electronically Signed: Soijett Blue, ED Scribe. 09/02/16. 3:09 AM.   History   Chief Complaint Chief Complaint  Patient presents with  . Headache    HPI  [name] is a 40 y.o. female with a PMHx of chronic headaches, VP shunt, who presents to the Emergency Department brought in by EMS complaining of right sided HA onset 3-4 hours ago. Pt notes that she has a VP shunt placed. Pt husband states that the pt typically gets headaches that correlate with an issue with her VP shunt. Pt is having associated symptoms of vision change. She notes that she has not tried any medications for the relief of her symptoms. She denies nausea and any other symptoms. Pt husband notes that the pt does have a PCP.    The history is provided by the patient and the spouse. No language interpreter was used.    Past Medical History:  Diagnosis Date  . Chronic cough   . Chronic headaches   . Pleural effusion on right    chronic due to VP shunt  . Ventriculopleural shunt status   . VP (ventriculoperitoneal) shunt status     Patient Active Problem List   Diagnosis Date Noted  . Obstructive hydrocephalus 08/22/2015  . Malfunction of ventriculo-peritoneal shunt (HCC) 08/20/2015  . Cough 01/27/2015  . Nonfunctioning ventriculoperitoneal shunt (HCC) 05/20/2014  . Cephalalgia   . Pleural effusion, right 05/12/2014  . Fever 05/12/2014  . Headache 05/12/2014  . Malfunction of ventriculoperitoneal shunt (HCC) 12/03/2013  . Cyst of breast 03/17/2012    Past Surgical History:  Procedure Laterality Date  . CRANIOTOMY Left 08/22/2015   Procedure: Left ventricular drain insertion;  Surgeon: Maeola HarmanJoseph Stern, MD;  Location: [facility];  Service: Neurosurgery;   Laterality: Left;  . LAPAROSCOPIC REVISION VENTRICULAR-PERITONEAL (V-P) SHUNT N/A 05/20/2014   Procedure: LAPAROSCOPIC REVISION VENTRICULAR-PERITONEAL (V-P) SHUNT;  Surgeon: Adolph Pollackodd J Rosenbower, MD;  Location: [facility];  Service: General;  Laterality: N/A;  . shunt     brain  . SHUNT REMOVAL N/A 05/20/2014   Procedure: shunt removal and placement of laproscopic VP shunt with Dr Abbey Chattersosenbower;  Surgeon: Tressie StalkerJeffrey Jenkins, MD;  [facility];  Service: Neurosurgery;  Laterality: N/A;  . SHUNT REVISION VENTRICULAR-PERITONEAL N/A 12/03/2013   Procedure: Revision of Ventricular Peritoneal Shunt;  Surgeon: Cristi LoronJeffrey D Jenkins, MD;  Location: [facility];  Service: Neurosurgery;  Laterality: N/A;  . VENTRICULOPERITONEAL SHUNT Right 05/20/2014   Procedure: SHUNT INSERTION VENTRICULAR-PERITONEAL;  Surgeon: Tressie StalkerJeffrey Jenkins, MD;  Location: [facility];  Service: Neurosurgery;  Laterality: Right;    OB History    No data available       Home Medications    Prior to Admission medications   Medication Sig Start Date End Date Taking? Authorizing Provider  traMADol (ULTRAM) 50 MG tablet 1-2 every 4 hours as needed for cough or pain Patient not taking: Reported on 05/26/2015 02/16/15   Nyoka CowdenMichael B Wert, MD  traMADol (ULTRAM) 50 MG tablet Take 1 tablet (50 mg total) by mouth every 6 (six) hours as needed for moderate pain. 09/06/15   Maeola HarmanJoseph Stern, MD    Family History Family History  Problem Relation Age of Onset  . Diabetes Father     Social History  Social History  Substance Use Topics  . Smoking status: Never Smoker  . Smokeless tobacco: Never Used  . Alcohol use No     Allergies   Patient has no known allergies.   Review of Systems Review of Systems  All other systems reviewed and are negative.    Physical Exam Updated Vital Signs BP 119/73   Pulse 75   Temp 98.3 F (36.8 C) (Oral)   Resp 16   SpO2 99%   Physical Exam  Constitutional: She is oriented to person, place,  and time. She appears well-developed and well-nourished.  HENT:  Head: Normocephalic and atraumatic.  Eyes: EOM are normal. Pupils are equal, round, and reactive to light.  Fundoscopic exam:      The right eye shows no papilledema.       The left eye shows no papilledema.  No papilledema.  Neck: Normal range of motion. Neck supple. No JVD present.  VP shunt palpable in right side of neck  Cardiovascular: Normal rate, regular rhythm and normal heart sounds.   No murmur heard. Pulmonary/Chest: Effort normal and breath sounds normal. She has no wheezes. She has no rales. She exhibits no tenderness.  Abdominal: Soft. Bowel sounds are normal. She exhibits no distension and no mass. There is no tenderness.  Musculoskeletal: Normal range of motion. She exhibits no edema.  Lymphadenopathy:    She has no cervical adenopathy.  Neurological: She is alert and oriented to person, place, and time. No cranial nerve deficit. She exhibits normal muscle tone. Coordination normal.  Somnolent but arousable.   Skin: Skin is warm and dry. No rash noted.  Psychiatric: She has a normal mood and affect. Her behavior is normal. Judgment and thought content normal.  Nursing note and vitals reviewed.    ED Treatments / Results  DIAGNOSTIC STUDIES: Oxygen Saturation is 99% on RA, nl by my interpretation.    COORDINATION OF CARE: 3:07 AM Discussed treatment plan with pt at bedside which includes CT head, labs, morphine, zofran, and pt agreed to plan.   Labs (all labs ordered are listed, but only abnormal results are displayed) Labs Reviewed  BASIC METABOLIC PANEL - Abnormal; Notable for the following:       Result Value   Potassium 3.0 (*)    Glucose, Bld 104 (*)    All other components within normal limits  CBC WITH DIFFERENTIAL/PLATELET     Radiology Ct Head Wo Contrast  Result Date: 09/02/2016 CLINICAL DATA:  40 y/o F; history of craniotomy and VP shunt with headache. EXAM: CT HEAD WITHOUT  CONTRAST TECHNIQUE: Contiguous axial images were obtained from the base of the skull through the vertex without intravenous contrast. COMPARISON:  10/11/2015 CT head. FINDINGS: Brain: Right parietal approach ventriculostomy catheter crossing septum pellucidum with tip in the left periventricular white matter unchanged in position. Lateral ventricles are stable in size and the third ventricle is decreased in size from prior CT. No effacement of basilar cisterns. No extra-axial collection identified. Linear bifrontal encephalomalacia compatible with prior catheter tracts. Vascular: No hyperdense vessel or unexpected calcification. Skull: Multiple frontal burr holes from prior catheters. No displaced calvarial fracture. Sinuses/Orbits: No acute finding.  Underpneumatized frontal sinuses. Other: None. IMPRESSION: No acute intracranial abnormality identified. Stable size of lateral ventricles and interval decrease in size of third ventricle from prior CT. Electronically Signed   By: Mitzi HansenLance  Furusawa-Stratton M.D.   On: 09/02/2016 04:58    Procedures Procedures (including critical care time)  Medications Ordered in ED  Medications  morphine 4 MG/ML injection 4 mg (4 mg Intravenous Given 09/02/16 0330)  ondansetron (ZOFRAN) injection 4 mg (4 mg Intravenous Given 09/02/16 0330)     Initial Impression / Assessment and Plan / ED Course  I have reviewed the triage vital signs and the nursing notes.  Pertinent labs & imaging results that were available during my care of the patient were reviewed by me and considered in my medical decision making (see chart for details).  Clinical Course    Headache in patient with ventriculoperitoneal shunt. Old records are reviewed, and she had a hospitalization one year ago because of shunt failure. She is given a dose of morphine and ondansetron and is sent for CT of head to look for evidence of shunt failure. No evidence of shunt failure present. No hydrocephalus. She felt  much better after above noted treatment. Patient is reassured about the findings of vision seems to be working, and she is discharged to follow-up with PCP.  Final Clinical Impressions(s) / ED Diagnoses   Final diagnoses:  Headache, unspecified headache type    New Prescriptions New Prescriptions   No medications on file   I personally performed the services described in this documentation, which was scribed in my presence. The recorded information has been reviewed and is accurate.       Dione Booze, MD 09/02/16 [phone]

## 2016-09-04 ENCOUNTER — Other Ambulatory Visit: Payer: Self-pay | Admitting: Neurosurgery

## 2016-09-04 DIAGNOSIS — Q039 Congenital hydrocephalus, unspecified: Secondary | ICD-10-CM

## 2016-12-27 ENCOUNTER — Other Ambulatory Visit: Payer: Self-pay | Admitting: Obstetrics and Gynecology

## 2016-12-27 DIAGNOSIS — E041 Nontoxic single thyroid nodule: Secondary | ICD-10-CM

## 2016-12-27 LAB — TSH: TSH: 0.9 u[IU]/mL (ref 0.41–5.90)

## 2017-01-07 ENCOUNTER — Ambulatory Visit
Admission: RE | Admit: 2017-01-07 | Discharge: 2017-01-07 | Disposition: A | Discharge status: Home / Self Care | Payer: Medicaid Other | Source: Ambulatory Visit | Attending: Obstetrics and Gynecology | Admitting: Obstetrics and Gynecology

## 2017-01-07 DIAGNOSIS — E041 Nontoxic single thyroid nodule: Secondary | ICD-10-CM

## 2017-02-13 ENCOUNTER — Encounter: Payer: Self-pay | Admitting: Endocrinology

## 2017-02-13 ENCOUNTER — Ambulatory Visit (INDEPENDENT_AMBULATORY_CARE_PROVIDER_SITE_OTHER): Payer: Self-pay | Admitting: Endocrinology

## 2017-02-13 VITALS — BP 118/66 | HR 72 | Ht 63.5 in | Wt 159.2 lb

## 2017-02-13 DIAGNOSIS — E049 Nontoxic goiter, unspecified: Secondary | ICD-10-CM | POA: Insufficient documentation

## 2017-02-13 NOTE — Progress Notes (Signed)
Patient ID: Uvaldo RisingRosanna Turazzo, female   DOB: 05/05/1976, 41 y.o.   MRN: [id number]           Reason for Appointment: Goiter, new consultation    History of Present Illness:   The patient's thyroid enlargement was first discovered in 3/18 by her gynecologist This was done on her routine exam She does not think she has had any history of thyroid enlargement previously  She has had no difficulty with swallowing  Does not feel like she has any discomfort or choking sensation in her neck or pressure She has been somewhat concerned because she thinks her sister had thyroid cancer needing surgery  She feels fairly good subjectively with no unusual fatigue, weight change or change in menstrual cycles  TSH on 12/27/16 was 0.9  Lab Results  Component Value Date   TSH 1.789 03/17/2012    She has had an ultrasound exam in 01/2017 which showed heterogenous thyroid with some subcentimeter nodules not meeting criteria for biopsy or surveillance    Allergies as of 02/13/2017   No Known Allergies     Medication List       Accurate as of 02/13/17  2:27 PM. Always use your most recent med list.          meloxicam 7.5 MG tablet Commonly known as:  MOBIC Take 7.5 mg by mouth as needed for pain.   traMADol 50 MG tablet Commonly known as:  ULTRAM Take 1 tablet (50 mg total) by mouth every 6 (six) hours as needed for moderate pain.       Allergies: No Known Allergies  Past Medical History:  Diagnosis Date  . Chronic cough   . Chronic headaches   . Pleural effusion on right    chronic due to VP shunt  . Ventriculopleural shunt status   . VP (ventriculoperitoneal) shunt status     Past Surgical History:  Procedure Laterality Date  . CRANIOTOMY Left 08/22/2015   Procedure: Left ventricular drain insertion;  Surgeon: Maeola HarmanJoseph Stern, MD;  Location: [facility];  Service: Neurosurgery;  Laterality: Left;  . LAPAROSCOPIC REVISION VENTRICULAR-PERITONEAL (V-P) SHUNT N/A 05/20/2014   Procedure: LAPAROSCOPIC REVISION VENTRICULAR-PERITONEAL (V-P) SHUNT;  Surgeon: Adolph Pollackodd J Rosenbower, MD;  Location: [facility];  Service: General;  Laterality: N/A;  . shunt     brain  . SHUNT REMOVAL N/A 05/20/2014   Procedure: shunt removal and placement of laproscopic VP shunt with Dr Abbey Chattersosenbower;  Surgeon: Tressie StalkerJeffrey Jenkins, MD;  [facility];  Service: Neurosurgery;  Laterality: N/A;  . SHUNT REVISION VENTRICULAR-PERITONEAL N/A 12/03/2013   Procedure: Revision of Ventricular Peritoneal Shunt;  Surgeon: Cristi LoronJeffrey D Jenkins, MD;  Location: [facility];  Service: Neurosurgery;  Laterality: N/A;  . VENTRICULOPERITONEAL SHUNT Right 05/20/2014   Procedure: SHUNT INSERTION VENTRICULAR-PERITONEAL;  Surgeon: Tressie StalkerJeffrey Jenkins, MD;  Location: [facility];  Service: Neurosurgery;  Laterality: Right;    Family History  Problem Relation Age of Onset  . Diabetes Father     Social History:  reports that she has never smoked. She has never used smokeless tobacco. She reports that she does not drink alcohol or use drugs.    Review of Systems  Constitutional: Negative for weight loss.  HENT: Negative for headaches.   Respiratory: Negative for shortness of breath.   Cardiovascular: Negative for leg swelling.  Endocrine: Negative for menstrual changes and cold intolerance.  Genitourinary:       She has been evaluated for microscopic hematuria  Musculoskeletal: Negative  for joint pain.  Neurological: Negative for weakness.      Examination:   BP 118/66   Pulse 72   Ht 5' 3.5" (1.613 m)   Wt 159 lb 3.2 oz (72.2 kg)   SpO2 98%   BMI 27.76 kg/m    General Appearance: pleasant, Averagely built and nourished          Eyes: No abnormal prominence or eyelid swelling.          Neck: The thyroid is enlarged Bilaterally, about 2-2.5 times normal, smooth, slightly more firm near the isthmus, enlarged about 1. 5-2 times on the left side, smooth.  There is no lymphadenopathy.    She has a  palpable V-P shunt on the right side of the neck  Cardiovascular: Normal  heart sounds, no murmur Respiratory:  Lungs clear Abdomen: No hepatomegaly Neurological: REFLEXES: at biceps are normal.  Skin: no rash      Extremities: No edema   Assessment/Plan:  Euthyroid goiter: She has insignificant subcentimeter nodules and heterogenous texture on ultrasound, may have Hashimoto thyroiditis However TSH now is 0.9 and she is subjectively doing well  Reassured the patient does not need any further evaluation or treatment Since she may have Hashimoto thyroiditis she should be followed annually  [facility] 02/13/2017

## 2018-02-10 ENCOUNTER — Other Ambulatory Visit (INDEPENDENT_AMBULATORY_CARE_PROVIDER_SITE_OTHER): Payer: Medicaid Other

## 2018-02-10 DIAGNOSIS — E049 Nontoxic goiter, unspecified: Secondary | ICD-10-CM

## 2018-02-10 LAB — TSH: TSH: 1.18 u[IU]/mL (ref 0.35–4.50)

## 2018-02-16 NOTE — Progress Notes (Signed)
Patient ID: [name], female   DOB: [birth year], 42 y.o.   MRN: [id number]           Reason for Appointment: Goiter, follow-up    History of Present Illness:   The patient's thyroid enlargement was first discovered in 3/18 by her gynecologist She does not think she has had any history of thyroid enlargement previously  She was found to have a simple euthyroid goiter on her initial visit in 02/2017  Recently has not felt any swelling in her neck and does not feel any pressure sensation No difficulty swallowing  She feels fairly good subjectively with no unusual fatigue  TSH is again normal  Lab Results  Component Value Date   TSH 1.18 02/10/2018   TSH 0.90 12/27/2016   TSH 1.789 03/17/2012    She has had an ultrasound exam in 01/2017 which showed heterogenous thyroid with some subcentimeter nodules not meeting criteria for biopsy or surveillance    Allergies as of 02/17/2018   No Known Allergies     Medication List        Accurate as of 02/17/18 10:17 AM. Always use your most recent med list.          meloxicam 7.5 MG tablet Commonly known as:  MOBIC Take 7.5 mg by mouth as needed for pain.   traMADol 50 MG tablet Commonly known as:  ULTRAM Take 1 tablet (50 mg total) by mouth every 6 (six) hours as needed for moderate pain.       Allergies: No Known Allergies  Past Medical History:  Diagnosis Date  . Chronic cough   . Chronic headaches   . Pleural effusion on right    chronic due to VP shunt  . Ventriculopleural shunt status   . VP (ventriculoperitoneal) shunt status     Past Surgical History:  Procedure Laterality Date  . CRANIOTOMY Left 08/22/2015   Procedure: Left ventricular drain insertion;  Surgeon: Maeola Harman, MD;  Location: [facility];  Service: Neurosurgery;  Laterality: Left;  . LAPAROSCOPIC REVISION VENTRICULAR-PERITONEAL (V-P) SHUNT N/A 05/20/2014   Procedure: LAPAROSCOPIC REVISION VENTRICULAR-PERITONEAL (V-P) SHUNT;  Surgeon:  Adolph Pollack, MD;  Location: [facility];  Service: General;  Laterality: N/A;  . shunt     brain  . SHUNT REMOVAL N/A 05/20/2014   Procedure: shunt removal and placement of laproscopic VP shunt with Dr Abbey Chatters;  Surgeon: Tressie Stalker, MD;  Location: [facility];  Service: Neurosurgery;  Laterality: N/A;  . SHUNT REVISION VENTRICULAR-PERITONEAL N/A 12/03/2013   Procedure: Revision of Ventricular Peritoneal Shunt;  Surgeon: Cristi Loron, MD;  Location: [facility];  Service: Neurosurgery;  Laterality: N/A;  . VENTRICULOPERITONEAL SHUNT Right 05/20/2014   Procedure: SHUNT INSERTION VENTRICULAR-PERITONEAL;  Surgeon: Tressie Stalker, MD;  Location: [facility];  Service: Neurosurgery;  Laterality: Right;    Family History  Problem Relation Age of Onset  . Diabetes Father     Social History:  reports that she has never smoked. She has never used smokeless tobacco. She reports that she does not drink alcohol or use drugs.    Review of Systems    Examination:   BP 118/80 (BP Location: Right Arm, Patient Position: Sitting, Cuff Size: Normal)   Pulse 65   Ht 5' 3.5" (1.613 m)   Wt 163 lb 9.6 oz (74.2 kg)   SpO2 98%   BMI 28.53 kg/m   She looks well         THYROID Right  lobe of thyroid is twice normal, relatively soft and smooth, isthmus is just palpable and slightly firmer Left side is about times normal and more diffuse, slightly firm without any distinct nodule .  There is no lymphadenopathy.    She has a palpable V-P shunt on the right side of the neck      Extremities: No edema   Assessment/Plan:  Euthyroid goiter: She has smaller thyroid enlargement compared to her initial visit especially on the right side  Thyroid feels heterogenous with smoother softer texture on the right side Again her TSH is quite normal and relatively stable Explained to the patient the function of the thyroid and that her thyroid enlargement is not needing any treatment at this  time  Since she may have Hashimoto thyroiditis she should be followed annually  [name] 02/17/2018

## 2018-02-17 ENCOUNTER — Ambulatory Visit (INDEPENDENT_AMBULATORY_CARE_PROVIDER_SITE_OTHER): Payer: Medicaid Other | Admitting: Endocrinology

## 2018-02-17 ENCOUNTER — Encounter: Payer: Self-pay | Admitting: Endocrinology

## 2018-02-17 ENCOUNTER — Ambulatory Visit: Payer: Self-pay | Admitting: Endocrinology

## 2018-02-17 VITALS — BP 118/80 | HR 65 | Ht 63.5 in | Wt 163.6 lb

## 2018-02-17 DIAGNOSIS — E049 Nontoxic goiter, unspecified: Secondary | ICD-10-CM

## 2018-03-25 ENCOUNTER — Other Ambulatory Visit: Payer: Self-pay | Admitting: Obstetrics and Gynecology

## 2018-03-25 DIAGNOSIS — E041 Nontoxic single thyroid nodule: Secondary | ICD-10-CM

## 2018-04-02 ENCOUNTER — Ambulatory Visit
Admission: RE | Admit: 2018-04-02 | Discharge: 2018-04-02 | Disposition: A | Discharge status: Home / Self Care | Payer: Medicaid Other | Source: Ambulatory Visit | Attending: Obstetrics and Gynecology | Admitting: Obstetrics and Gynecology

## 2018-04-02 DIAGNOSIS — E041 Nontoxic single thyroid nodule: Secondary | ICD-10-CM

## 2018-07-16 ENCOUNTER — Other Ambulatory Visit: Payer: Self-pay | Admitting: Internal Medicine

## 2018-07-16 ENCOUNTER — Ambulatory Visit
Admission: RE | Admit: 2018-07-16 | Discharge: 2018-07-16 | Disposition: A | Discharge status: Home / Self Care | Payer: Medicaid Other | Source: Ambulatory Visit | Attending: Internal Medicine | Admitting: Internal Medicine

## 2018-07-16 DIAGNOSIS — R05 Cough: Secondary | ICD-10-CM

## 2018-07-16 DIAGNOSIS — R059 Cough, unspecified: Secondary | ICD-10-CM

## 2018-07-21 ENCOUNTER — Other Ambulatory Visit: Payer: Self-pay | Admitting: Orthopedic Surgery

## 2018-07-21 DIAGNOSIS — M549 Dorsalgia, unspecified: Secondary | ICD-10-CM

## 2018-07-31 ENCOUNTER — Other Ambulatory Visit: Payer: Self-pay | Admitting: Internal Medicine

## 2018-07-31 DIAGNOSIS — Z1231 Encounter for screening mammogram for malignant neoplasm of breast: Secondary | ICD-10-CM

## 2018-08-01 ENCOUNTER — Other Ambulatory Visit (HOSPITAL_COMMUNITY): Payer: Self-pay | Admitting: Neurosurgery

## 2018-08-01 DIAGNOSIS — Q030 Malformations of aqueduct of Sylvius: Secondary | ICD-10-CM

## 2018-08-06 ENCOUNTER — Encounter (HOSPITAL_COMMUNITY): Payer: Self-pay

## 2018-08-06 ENCOUNTER — Ambulatory Visit (HOSPITAL_COMMUNITY)
Admission: RE | Admit: 2018-08-06 | Discharge: 2018-08-06 | Disposition: A | Discharge status: Home / Self Care | Payer: Medicare Other | Source: Ambulatory Visit | Attending: Neurosurgery | Admitting: Neurosurgery

## 2018-08-06 DIAGNOSIS — Q030 Malformations of aqueduct of Sylvius: Secondary | ICD-10-CM | POA: Diagnosis not present

## 2018-08-06 DIAGNOSIS — Z982 Presence of cerebrospinal fluid drainage device: Secondary | ICD-10-CM | POA: Insufficient documentation

## 2018-08-06 DIAGNOSIS — Z8673 Personal history of transient ischemic attack (TIA), and cerebral infarction without residual deficits: Secondary | ICD-10-CM | POA: Diagnosis not present

## 2018-08-06 DIAGNOSIS — G9389 Other specified disorders of brain: Secondary | ICD-10-CM | POA: Insufficient documentation

## 2018-08-07 ENCOUNTER — Encounter: Payer: Self-pay | Admitting: *Deleted

## 2018-08-15 ENCOUNTER — Encounter: Payer: Self-pay | Admitting: Neurology

## 2018-09-15 ENCOUNTER — Ambulatory Visit: Payer: Medicaid Other

## 2018-09-16 ENCOUNTER — Encounter: Payer: Medicare Other | Admitting: Advanced Practice Midwife

## 2018-10-20 ENCOUNTER — Ambulatory Visit: Payer: Medicare Other | Admitting: Neurology

## 2018-11-18 NOTE — Progress Notes (Addendum)
NEUROLOGY CONSULTATION NOTE  [name] MRN: [id number] DOB: 1976-07-27  Referring provider: Jamison Oka, MD Primary care provider: Jamison Oka, MD  Reason for consult:  headache  HISTORY OF PRESENT ILLNESS: [name] is a 43 year old  woman with history of obstructive hydrocephalus status post VP shunt and non-toxic goiter who presents for headaches.  History supplemented by referring provider and neurosurgery notes.  She is also accompanied by her husband who supplements history.  [name] has VP shunt for obstructive hydrocephalus placed 2001 after experiencing frequent headaches.  She has had revisions due to shunt malfunction in 2015 and 2017.  She had headaches since 6 months ago.  She has paroxysmal left sided headache in the posterior parietal region, severe stabbing pain lasting 8-9 seconds.  Initially they occurred 5-6 times a day.  They now occur 1 to 2 times during the day but 4-5 times at night.  No associated nausea, vomiting, photophobia, phonophobia, conjunctival injection, ptosis, eye lacrimation, nasal congestion, dizziness, visual disturbance or unilateral numbness or weakness.  No triggers and resolves spontaneously.    Eye exam was normal.  Her most recent CT of head from 08/06/18 was personally reviewed and demonstrated VP shunt via right parietal burr hole with distal tip in left frontal white matter, slit like ventricles, and bifrontal encephalomalacia and right basal ganglia encephalomalacia stable compared to prior CT from 09/02/16.  No acute abnormalities or hydrocephalus noted.    Current medication:  Tramadol (helps)  Past medications:  Advil, Tylenol   PAST MEDICAL HISTORY: Past Medical History:  Diagnosis Date  . Chronic cough   . Chronic headaches   . Pleural effusion on right    chronic due to VP shunt  . Ventriculopleural shunt status   . VP (ventriculoperitoneal) shunt status     PAST SURGICAL HISTORY: Past  Surgical History:  Procedure Laterality Date  . CRANIOTOMY Left 08/22/2015   Procedure: Left ventricular drain insertion;  Surgeon: Maeola Harman, MD;  Location: [facility];  Service: Neurosurgery;  Laterality: Left;  . LAPAROSCOPIC REVISION VENTRICULAR-PERITONEAL (V-P) SHUNT N/A 05/20/2014   Procedure: LAPAROSCOPIC REVISION VENTRICULAR-PERITONEAL (V-P) SHUNT;  Surgeon: Adolph Pollack, MD;  Location: [facility];  Service: General;  Laterality: N/A;  . shunt     brain  . SHUNT REMOVAL N/A 05/20/2014   Procedure: shunt removal and placement of laproscopic VP shunt with Dr Abbey Chatters;  Surgeon: Tressie Stalker, MD;  Location: [facility];  Service: Neurosurgery;  Laterality: N/A;  . SHUNT REVISION VENTRICULAR-PERITONEAL N/A 12/03/2013   Procedure: Revision of Ventricular Peritoneal Shunt;  Surgeon: Cristi Loron, MD;  Location: [facility];  Service: Neurosurgery;  Laterality: N/A;  . VENTRICULOPERITONEAL SHUNT Right 05/20/2014   Procedure: SHUNT INSERTION VENTRICULAR-PERITONEAL;  Surgeon: Tressie Stalker, MD;  Location: [facility];  Service: Neurosurgery;  Laterality: Right;    MEDICATIONS: Current Outpatient Medications on File Prior to Visit  Medication Sig Dispense Refill  . meloxicam (MOBIC) 7.5 MG tablet Take 7.5 mg by mouth as needed for pain.    . traMADol (ULTRAM) 50 MG tablet Take 1 tablet (50 mg total) by mouth every 6 (six) hours as needed for moderate pain. (Patient not taking: Reported on 02/13/2017) 30 tablet 0   No current facility-administered medications on file prior to visit.     ALLERGIES: No Known Allergies  FAMILY HISTORY: Family History  Problem Relation Age of Onset  . Diabetes Father     SOCIAL HISTORY: Social History  Socioeconomic History  . Marital status: Married    Spouse name: Not on file  . Number of children: Not on file  . Years of education: Not on file  . Highest education level: Not on file  Occupational History  . Occupation:  Retail buyerWAITRESS    Employer: [employer]  Social Needs  . Financial resource strain: Not on file  . Food insecurity:    Worry: Not on file    Inability: Not on file  . Transportation needs:    Medical: Not on file    Non-medical: Not on file  Tobacco Use  . Smoking status: Never Smoker  . Smokeless tobacco: Never Used  Substance and Sexual Activity  . Alcohol use: No  . Drug use: No  . Sexual activity: Yes    Birth control/protection: I.U.D.  Lifestyle  . Physical activity:    Days per week: Not on file    Minutes per session: Not on file  . Stress: Not on file  Relationships  . Social connections:    Talks on phone: Not on file    Gets together: Not on file    Attends religious service: Not on file    Active member of club or organization: Not on file    Attends meetings of clubs or organizations: Not on file    Relationship status: Not on file  . Intimate partner violence:    Fear of current or ex partner: Not on file    Emotionally abused: Not on file    Physically abused: Not on file    Forced sexual activity: Not on file  Other Topics Concern  . Not on file  Social History Narrative  . Not on file    REVIEW OF SYSTEMS: Constitutional: No fevers, chills, or sweats, no generalized fatigue, change in appetite Eyes: No visual changes, double vision, eye pain Ear, nose and throat: No hearing loss, ear pain, nasal congestion, sore throat Cardiovascular: No chest pain, palpitations Respiratory:  No shortness of breath at rest or with exertion, wheezes GastrointestinaI: No nausea, vomiting, diarrhea, abdominal pain, fecal incontinence Genitourinary:  No dysuria, urinary retention or frequency Musculoskeletal:  No neck pain, back pain Integumentary: No rash, pruritus, skin lesions Neurological: as above Psychiatric: No depression, insomnia, anxiety Endocrine: No palpitations, fatigue, diaphoresis, mood swings, change in appetite, change in weight, increased  thirst Hematologic/Lymphatic:  No purpura, petechiae. Allergic/Immunologic: no itchy/runny eyes, nasal congestion, recent allergic reactions, rashes  PHYSICAL EXAM: Blood pressure 110/70, pulse 78, height 5\' 4"  (1.626 m), weight 165 lb (74.8 kg), SpO2 99 %. General: No acute distress.  Patient appears well-groomed.  Head:  Normocephalic/atraumatic Eyes:  fundi examined but not visualized Neck: supple, no paraspinal tenderness, full range of motion Back: No paraspinal tenderness Heart: regular rate and rhythm Lungs: Clear to auscultation bilaterally. Vascular: No carotid bruits. Neurological Exam: Mental status: alert and oriented to person, place, and time, recent and remote memory intact, fund of knowledge intact, attention and concentration intact, speech fluent and not dysarthric, language intact. Cranial nerves: CN I: not tested CN II: pupils equal, round and reactive to light, visual fields intact CN III, IV, VI:  full range of motion, no nystagmus, no ptosis CN V: facial sensation intact CN VII: upper and lower face symmetric CN VIII: hearing intact CN IX, X: gag intact, uvula midline CN XI: sternocleidomastoid and trapezius muscles intact CN XII: tongue midline Bulk & Tone: normal, no fasciculations. Motor:  5/5 throughout  Sensation: temperature and vibration sensation  intact. Deep Tendon Reflexes:  2+ throughout, toes downgoing.  Finger to nose testing:  Without dysmetria.  Heel to shin:  Without dysmetria.  Gait:  Normal station and stride.  Romberg negative.  IMPRESSION: 1.  Probable primary stabbing headache 2.  History of obstructive hydrocephalus s/p VP shunt  PLAN: 1.  Check CTA of head to evaluate for any secondary vascular etiology (such as aneurysm) 2.  Start indomethacin 25mg  three times daily, taken with food. 3.  Start melatonin 3 to 12mg  at bedtime. 4.  Follow up in 4 months.  Thank you for allowing me to take part in the care of this patient.  Shon Millet, DO  CC:  Jamison Oka, MD

## 2018-11-19 ENCOUNTER — Ambulatory Visit (INDEPENDENT_AMBULATORY_CARE_PROVIDER_SITE_OTHER): Payer: Medicare Other | Admitting: Neurology

## 2018-11-19 ENCOUNTER — Encounter: Payer: Self-pay | Admitting: Neurology

## 2018-11-19 VITALS — BP 110/70 | HR 78 | Ht 64.0 in | Wt 165.0 lb

## 2018-11-19 DIAGNOSIS — G4485 Primary stabbing headache: Secondary | ICD-10-CM

## 2018-11-19 DIAGNOSIS — G911 Obstructive hydrocephalus: Secondary | ICD-10-CM | POA: Diagnosis not present

## 2018-11-19 MED ORDER — INDOMETHACIN 25 MG PO CAPS: 25.0000 mg | ORAL_CAPSULE | Freq: Three times a day (TID) | ORAL | 3 refills | Status: DC

## 2018-11-19 NOTE — Patient Instructions (Addendum)
1.  We will check CTA of head 2.  Start indomethacin 25mg  three times daily.  Take with food.   3.  Take over the counter melatonin from 3mg  to 12mg  at bedtime 4.  Follow up in 4 months.  We have sent a referral to [facility] for your MRI and they will call you directly to schedule your appt. They are located at [address] [facility]. If you need to contact them directly please call [phone].

## 2018-12-03 ENCOUNTER — Encounter: Payer: Self-pay | Admitting: Radiology

## 2018-12-08 ENCOUNTER — Other Ambulatory Visit: Payer: Medicare Other

## 2018-12-15 ENCOUNTER — Ambulatory Visit
Admission: RE | Admit: 2018-12-15 | Discharge: 2018-12-15 | Disposition: A | Discharge status: Home / Self Care | Payer: Medicare Other | Source: Ambulatory Visit | Attending: Neurology | Admitting: Neurology

## 2018-12-15 DIAGNOSIS — G4485 Primary stabbing headache: Secondary | ICD-10-CM

## 2018-12-15 MED ORDER — IOPAMIDOL (ISOVUE-370) INJECTION 76%
75.0000 mL | Freq: Once | INTRAVENOUS | Status: AC | PRN
  Administered 2018-12-15: 75 mL via INTRAVENOUS

## 2018-12-22 ENCOUNTER — Telehealth: Payer: Self-pay

## 2018-12-22 NOTE — Telephone Encounter (Signed)
Called and spoke with Pt and son. Pt gave me permission to speak with son. He put the call on speaker. I advised of CT results

## 2018-12-22 NOTE — Telephone Encounter (Signed)
-----   Message from Drema Dallas, DO sent at 12/16/2018  7:47 AM EDT ----- CT shows no aneurysm or other concerning abnormality that would be causing headaches

## 2019-02-16 ENCOUNTER — Other Ambulatory Visit: Payer: Medicaid Other

## 2019-02-16 ENCOUNTER — Other Ambulatory Visit (INDEPENDENT_AMBULATORY_CARE_PROVIDER_SITE_OTHER): Payer: Medicare Other

## 2019-02-16 ENCOUNTER — Other Ambulatory Visit: Payer: Self-pay

## 2019-02-16 DIAGNOSIS — E049 Nontoxic goiter, unspecified: Secondary | ICD-10-CM

## 2019-02-16 LAB — TSH: TSH: 1.47 u[IU]/mL (ref 0.35–4.50)

## 2019-02-17 LAB — THYROID PEROXIDASE ANTIBODY: Thyroperoxidase Ab SerPl-aCnc: 192 IU/mL — ABNORMAL HIGH (ref 0–34)

## 2019-02-18 ENCOUNTER — Other Ambulatory Visit: Payer: Self-pay

## 2019-02-18 ENCOUNTER — Encounter: Payer: Self-pay | Admitting: Endocrinology

## 2019-02-18 ENCOUNTER — Ambulatory Visit (INDEPENDENT_AMBULATORY_CARE_PROVIDER_SITE_OTHER): Payer: Medicare Other | Admitting: Endocrinology

## 2019-02-18 VITALS — BP 108/60 | HR 66 | Ht 64.0 in | Wt 170.6 lb

## 2019-02-18 DIAGNOSIS — E063 Autoimmune thyroiditis: Secondary | ICD-10-CM | POA: Insufficient documentation

## 2019-02-18 DIAGNOSIS — E049 Nontoxic goiter, unspecified: Secondary | ICD-10-CM | POA: Diagnosis not present

## 2019-02-18 NOTE — Progress Notes (Signed)
Patient ID: [name], female   DOB: 1976-08-20, 43 y.o.   MRN: [id number]           Reason for Appointment: Goiter, follow-up    History of Present Illness:   The patient's thyroid enlargement was first discovered in 3/18 by her gynecologist She does not think she has had any history of thyroid enlargement previously  She was felt to have a simple euthyroid goiter on her initial visit in 02/2017  She is back for annual visit As before has not felt any swelling in her neck and does not feel any difficulty swallowing, local pressure  She does not complain of any unusual fatigue or hair loss, does have some weight fluctuation  Wt Readings from Last 3 Encounters:  02/18/19 170 lb 9.6 oz (77.4 kg)  11/19/18 165 lb (74.8 kg)  02/17/18 163 lb 9.6 oz (74.2 kg)     TSH is again normal TPO antibody is increased  Lab Results  Component Value Date   TSH 1.47 02/16/2019   TSH 1.18 02/10/2018   TSH 0.90 12/27/2016   Lab on 02/16/2019  Component Date Value Ref Range Status  . Thyroperoxidase Ab SerPl-aCnc 02/16/2019 192* 0 - 34 IU/mL Final  . TSH 02/16/2019 1.47  0.35 - 4.50 uIU/mL Final    She has had an ultrasound exam in 03/2018 ordered by the gynecologist with the following results  The previously described 9 mm mid isthmus TR 3 nodule and the 8 mm right mid thyroid TR 3 nodule are both stable and do not meet criteria for biopsy or continued follow-up.  The previously described 7 mm left superior TR 4 nodule is actually smaller now measuring 5 mm. This also does not meet criteria for biopsy or continued follow-up.  IMPRESSION: Stable moderate thyroid heterogeneity and hypervascularity representing medical thyroid disease, query sequelae from prior thyroiditis. Heterogenous thyroid with some subcentimeter nodules not meeting criteria for biopsy or surveillance    Allergies as of 02/18/2019   No Known Allergies     Medication List       Accurate as of Feb 18, 2019 12:13 PM. If you have any questions, ask your nurse or doctor.        STOP taking these medications   indomethacin 25 MG capsule Commonly known as:  INDOCIN Stopped by:  [name], MD   traMADol 50 MG tablet Commonly known as:  Ultram Stopped by:  [name], MD     TAKE these medications   meloxicam 7.5 MG tablet Commonly known as:  MOBIC Take 7.5 mg by mouth as needed for pain.       Allergies: No Known Allergies  Past Medical History:  Diagnosis Date  . Chronic cough   . Chronic headaches   . Pleural effusion on right    chronic due to VP shunt  . Ventriculopleural shunt status   . VP (ventriculoperitoneal) shunt status     Past Surgical History:  Procedure Laterality Date  . CRANIOTOMY Left 08/22/2015   Procedure: Left ventricular drain insertion;  Surgeon: Maeola Harman, MD;  Location: [facility];  Service: Neurosurgery;  Laterality: Left;  . LAPAROSCOPIC REVISION VENTRICULAR-PERITONEAL (V-P) SHUNT N/A 05/20/2014   Procedure: LAPAROSCOPIC REVISION VENTRICULAR-PERITONEAL (V-P) SHUNT;  Surgeon: Adolph Pollack, MD;  Location: [facility];  Service: General;  Laterality: N/A;  . shunt     brain  . SHUNT REMOVAL N/A 05/20/2014   Procedure: shunt removal and placement of laproscopic VP shunt with  Dr Abbey Chattersosenbower;  Surgeon: Tressie StalkerJeffrey Jenkins, MD;  [facility];  Service: Neurosurgery;  Laterality: N/A;  . SHUNT REVISION VENTRICULAR-PERITONEAL N/A 12/03/2013   Procedure: Revision of Ventricular Peritoneal Shunt;  Surgeon: Cristi LoronJeffrey D Jenkins, MD;  Location: [facility];  Service: Neurosurgery;  Laterality: N/A;  . VENTRICULOPERITONEAL SHUNT Right 05/20/2014   Procedure: SHUNT INSERTION VENTRICULAR-PERITONEAL;  Surgeon: Tressie StalkerJeffrey Jenkins, MD;  Location: [facility];  Service: Neurosurgery;  Laterality: Right;    Family History  Problem Relation Age of Onset  . Diabetes Father   . Thyroid nodules Sister     Social History:  reports that she has never  smoked. She has never used smokeless tobacco. She reports that she does not drink alcohol or use drugs.    Review of Systems    Examination:   BP 108/60 (BP Location: Left Arm, Patient Position: Sitting, Cuff Size: Normal)   Pulse 66   Ht 5\' 4"  (1.626 m)   Wt 170 lb 9.6 oz (77.4 kg)   SpO2 96%   BMI 29.28 kg/m   She looks well , no swelling of the eyes or pallor        THYROID exam:  Right lobe of thyroid is enlarged twice normal, texture soft and smooth The isthmus is just palpable and has a firm area on the left side Left side is only minimally enlarged and just palpable .  There is no lymphadenopathy.    Skin appears normal   Assessment/Plan:  Euthyroid goiter with Hashimoto's thyroiditis:   She has a stable thyroid enlargement mostly on the right side Her left thyroid enlargement appears less prominent She does have a firm area on the left of the isthmus  Previous ultrasound exams including in June 2019 did not show any significant nodules and compatible with Hashimoto's thyroiditis Result was reviewed with the patient  However because of the difference in findings on the left isthmus and left lobe we will plan to do a follow-up ultrasound in about 3 months given the current pandemic situation However if follow-up ultrasound is unremarkable or stable will not need further evaluation  Otherwise she can follow-up annually Discussed about Hashimoto's thyroiditis is potential for hypothyroidism Patient given a handout Patient's husband was also present and he was helping in interpretation also  Counseling time on subjects discussed in assessment and plan sections is over 50% of today's 25 minute visit    Reather LittlerAjay Trayon Krantz 02/18/2019

## 2019-03-30 ENCOUNTER — Ambulatory Visit: Payer: Medicare Other | Admitting: Neurology

## 2019-04-19 ENCOUNTER — Other Ambulatory Visit: Payer: Self-pay | Admitting: Endocrinology

## 2019-04-19 DIAGNOSIS — E049 Nontoxic goiter, unspecified: Secondary | ICD-10-CM

## 2019-05-06 ENCOUNTER — Other Ambulatory Visit: Payer: Medicare Other

## 2019-05-18 ENCOUNTER — Other Ambulatory Visit: Payer: Medicare Other

## 2019-05-25 ENCOUNTER — Other Ambulatory Visit: Payer: Medicare Other

## 2019-06-08 ENCOUNTER — Telehealth: Payer: Medicare Other | Admitting: Neurology

## 2019-06-21 NOTE — Progress Notes (Deleted)
Virtual Visit via Video Note The purpose of this virtual visit is to provide medical care while limiting exposure to the novel coronavirus.    Consent was obtained for video visit:  Yes Answered questions that patient had about telehealth interaction:  Yes I discussed the limitations, risks, security and privacy concerns of performing an evaluation and management service by telemedicine. I also discussed with the patient that there may be a patient responsible charge related to this service. The patient expressed understanding and agreed to proceed.  Pt location: Home Physician Location: Home Name of referring provider:  Audley Hose, MD I connected with [name] at patients initiation/request on 06/22/2019 at 10:30 AM EDT by video enabled telemedicine application and verified that I am speaking with the correct person using two identifiers. 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  UPDATE: CTA of head from 12/15/2018 was negative for intracranial vascular abnormality or acute abnormality.  She was started on indomethacin and advised to start melatonin.   Intensity:  *** Duration:  *** Frequency:  *** Frequency of abortive medication: *** Current NSAIDS:  Indomethacin 25mg  three times daily Current analgesics:  *** Current triptans:  *** Current ergotamine:  *** Current anti-emetic:  *** Current muscle relaxants:  *** Current anti-anxiolytic:  *** Current sleep aide:  *** Current Antihypertensive medications:  *** Current Antidepressant medications:  *** Current Anticonvulsant medications:  *** Current anti-CGRP:  *** Current Vitamins/Herbal/Supplements:  Melatonin *** Current Antihistamines/Decongestants:  *** Other  therapy:  *** Other medication:  ***  Caffeine:  *** Alcohol:  *** Smoker:  *** Diet:  *** Exercise:  *** Depression:  ***; Anxiety:  *** Other pain:  *** Sleep hygiene:  ***  HISTORY: [name] has VP shunt for obstructive hydrocephalus placed 2001 after experiencing frequent headaches.  She has had revisions due to shunt malfunction in 2015 and 2017.  She had headaches since summer 2019.  She has paroxysmal left sided headache in the posterior parietal region, severe stabbing pain lasting 8-9 seconds.  Initially they occurred 5-6 times a day.  They now occur 1 to 2 times during the day but 4-5 times at night.  No associated symptoms such as nausea, vomiting, photophobia, phonophobia, conjunctival injection, ptosis, eye lacrimation, nasal congestion, dizziness, visual disturbance or unilateral numbness or weakness.  No triggers and resolve spontaneously.  Eye exam was normal.  CT of head from 08/06/18 was personally reviewed and demonstrated VP shunt via right parietal burr hole with distal tip in left frontal white matter, slit like ventricles, and bifrontal encephalomalacia and right basal ganglia encephalomalacia stable compared to prior CT from 09/02/16.  No acute abnormalities or hydrocephalus noted.    Past medications:  Advil, Tylenol  Past Medical History: Past Medical History:  Diagnosis Date  . Chronic cough   . Chronic headaches   . Pleural effusion on right    chronic due to VP shunt  . Ventriculopleural shunt status   . VP (ventriculoperitoneal) shunt status     Medications: Outpatient Encounter Medications as of 06/22/2019  Medication Sig  . meloxicam (MOBIC) 7.5 MG tablet Take 7.5 mg by mouth as needed for pain.   No facility-administered encounter medications on file as of 06/22/2019.     Allergies: No Known  Allergies  Family History: Family History  Problem Relation Age of Onset  . Diabetes Father   . Thyroid nodules Sister     Social  History: Social History   Socioeconomic History  . Marital status: Married    Spouse name: Not on file  . Number of children: Not on file  . Years of education: Not on file  . Highest education level: Not on file  Occupational History  . Occupation: Retail buyerWAITRESS    Employer: [employer]  Social Needs  . Financial resource strain: Not on file  . Food insecurity    Worry: Not on file    Inability: Not on file  . Transportation needs    Medical: Not on file    Non-medical: Not on file  Tobacco Use  . Smoking status: Never Smoker  . Smokeless tobacco: Never Used  Substance and Sexual Activity  . Alcohol use: No  . Drug use: No  . Sexual activity: Yes    Birth control/protection: I.U.D.  Lifestyle  . Physical activity    Days per week: Not on file    Minutes per session: Not on file  . Stress: Not on file  Relationships  . Social Musicianconnections    Talks on phone: Not on file    Gets together: Not on file    Attends religious service: Not on file    Active member of club or organization: Not on file    Attends meetings of clubs or organizations: Not on file    Relationship status: Not on file  . Intimate partner violence    Fear of current or ex partner: Not on file    Emotionally abused: Not on file    Physically abused: Not on file    Forced sexual activity: Not on file  Other Topics Concern  . Not on file  Social History Narrative  . Not on file    Observations/Objective:   *** No acute distress.  Alert and oriented.  Speech fluent and not dysarthric.  Language intact.  Eyes orthophoric on primary gaze.  Face symmetric.  Assessment and Plan:   1.  Primary stabbing headache 2.  History of obstructive hydrocephalus, s/p VP shunt  1.  For preventative management, *** 2.  For abortive therapy, *** 3.  Limit use of pain relievers to no more than 2 days out of week to prevent risk of rebound or medication-overuse headache. 4.  Keep headache diary 5.  Exercise,  hydration, caffeine cessation, sleep hygiene, monitor for and avoid triggers 6.  Consider:  magnesium citrate 400mg  daily, riboflavin 400mg  daily, and coenzyme Q10 100mg  three times daily 7. Always keep in mind that currently taking a hormone or birth control may be a possible trigger or aggravating factor for migraine. 8. Follow up ***   Follow Up Instructions:    -I discussed the assessment and treatment plan with the patient. The patient was provided an opportunity to ask questions and all were answered. The patient agreed with the plan and demonstrated an understanding of the instructions.   The patient was advised to call back or seek an in-person evaluation if the symptoms worsen or if the condition fails to improve as anticipated.  Cira ServantAdam Robert Tamina Cyphers, DO

## 2019-06-22 ENCOUNTER — Ambulatory Visit: Payer: Medicare Other

## 2019-06-22 ENCOUNTER — Other Ambulatory Visit: Payer: Self-pay

## 2019-06-22 ENCOUNTER — Telehealth: Payer: Medicare Other | Admitting: Neurology

## 2019-06-23 ENCOUNTER — Ambulatory Visit (INDEPENDENT_AMBULATORY_CARE_PROVIDER_SITE_OTHER): Payer: Medicare Other | Admitting: Neurology

## 2019-06-23 ENCOUNTER — Encounter: Payer: Self-pay | Admitting: Neurology

## 2019-06-23 VITALS — BP 110/70 | HR 70 | Temp 98.6°F | Ht 65.0 in | Wt 155.0 lb

## 2019-06-23 DIAGNOSIS — G911 Obstructive hydrocephalus: Secondary | ICD-10-CM | POA: Diagnosis not present

## 2019-06-23 DIAGNOSIS — G4485 Primary stabbing headache: Secondary | ICD-10-CM

## 2019-06-23 NOTE — Patient Instructions (Addendum)
1.  Start taking melatonin 3mg  at bedtime to help prevent headaches.  You may increase dose up to 12mg  at bedtime if needed. 2.  If headaches become worse, contact me 3.  Follow up in 4 months.

## 2019-06-23 NOTE — Progress Notes (Signed)
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  History supplemented by referring provider and neurosurgery notes.  She is also accompanied by her husband who supplements history.  UPDATE: CTA of head 12/15/18 was unremarkable  She reports that the headaches are no longer daily.  They occur only with strenuous activity/bending over, lasting a few seconds.  There may be associated lightheadedness.  Only 3 or 4 times in past 2 weeks.  She is fine if she is relaxed.  She never started indomethacin or melatonin.   HISTORY: [name] has VP shunt for obstructive hydrocephalus placed 2001 after experiencing frequent headaches.  She has had revisions due to shunt malfunction in 2015 and 2017.  She had headaches since 6 months ago.  She has paroxysmal left sided headache in the posterior parietal region, severe stabbing pain lasting 8-9 seconds.  Initially they occurred 5-6 times a day.  They now occur 1 to 2 times during the day but 4-5 times at night.  No associated nausea, vomiting, photophobia, phonophobia, conjunctival injection, ptosis, eye lacrimation, nasal congestion, dizziness, visual disturbance or unilateral numbness or weakness.  No triggers and resolves spontaneously.    Eye exam was normal.  Her most recent CT of head from 08/06/18 was personally reviewed and demonstrated VP shunt via right parietal burr hole with distal tip in left frontal white matter, slit like ventricles, and bifrontal encephalomalacia and right basal ganglia encephalomalacia stable compared to prior CT from 09/02/16.  No acute abnormalities or hydrocephalus noted.     Past medications:  Advil, Tylenol  PAST MEDICAL HISTORY: Past Medical History:  Diagnosis Date  . Chronic cough   . Chronic headaches    . Pleural effusion on right    chronic due to VP shunt  . Ventriculopleural shunt status   . VP (ventriculoperitoneal) shunt status     MEDICATIONS: Current Outpatient Medications on File Prior to Visit  Medication Sig Dispense Refill  . meloxicam (MOBIC) 7.5 MG tablet Take 7.5 mg by mouth as needed for pain.     No current facility-administered medications on file prior to visit.     ALLERGIES: No Known Allergies  FAMILY HISTORY: Family History  Problem Relation Age of Onset  . Diabetes Father   . Thyroid nodules Sister     SOCIAL HISTORY: Social History   Socioeconomic History  . Marital status: Married    Spouse name: Not on file  . Number of children: Not on file  . Years of education: Not on file  . Highest education level: Not on file  Occupational History  . Occupation: Retail buyerWAITRESS    Employer: [employer]  Social Needs  . Financial resource strain: Not on file  . Food insecurity    Worry: Not on file    Inability: Not on file  . Transportation needs    Medical: Not on file    Non-medical: Not on file  Tobacco Use  . Smoking status: Never Smoker  . Smokeless tobacco: Never Used  Substance and Sexual Activity  . Alcohol use: No  . Drug use: No  . Sexual activity: Yes    Birth control/protection: I.U.D.  Lifestyle  . Physical activity    Days per week: Not on file    Minutes per session: Not on file  . Stress: Not  on file  Relationships  . Social Herbalist on phone: Not on file    Gets together: Not on file    Attends religious service: Not on file    Active member of club or organization: Not on file    Attends meetings of clubs or organizations: Not on file    Relationship status: Not on file  . Intimate partner violence    Fear of current or ex partner: Not on file    Emotionally abused: Not on file    Physically abused: Not on file    Forced sexual activity: Not on file  Other Topics Concern  . Not on file  Social  History Narrative  . Not on file    REVIEW OF SYSTEMS: Constitutional: No fevers, chills, or sweats, no generalized fatigue, change in appetite Eyes: No visual changes, double vision, eye pain Ear, nose and throat: No hearing loss, ear pain, nasal congestion, sore throat Cardiovascular: No chest pain, palpitations Respiratory:  No shortness of breath at rest or with exertion, wheezes GastrointestinaI: No nausea, vomiting, diarrhea, abdominal pain, fecal incontinence Genitourinary:  No dysuria, urinary retention or frequency Musculoskeletal:  No neck pain, back pain Integumentary: No rash, pruritus, skin lesions Neurological: as above Psychiatric: No depression, insomnia, anxiety Endocrine: No palpitations, fatigue, diaphoresis, mood swings, change in appetite, change in weight, increased thirst Hematologic/Lymphatic:  No purpura, petechiae. Allergic/Immunologic: no itchy/runny eyes, nasal congestion, recent allergic reactions, rashes  PHYSICAL EXAM: Blood pressure 110/70, pulse 70, temperature 98.6 F (37 C), height 5\' 5"  (1.651 m), weight 155 lb (70.3 kg), SpO2 99 %. General: No acute distress.  Patient appears well-groomed.   Head:  Normocephalic/atraumatic Eyes:  Fundi examined but not visualized Neck: supple, no paraspinal tenderness, full range of motion Heart:  Regular rate and rhythm Lungs:  Clear to auscultation bilaterally Back: No paraspinal tenderness Neurological Exam: alert and oriented to person, place, and time. Attention span and concentration intact, recent and remote memory intact, fund of knowledge intact.  Speech fluent and not dysarthric, language intact.  CN II-XII intact. Bulk and tone normal, muscle strength 5/5 throughout.  Sensation to light touch, temperature and vibration intact.  Deep tendon reflexes 2+ throughout, toes downgoing.  Finger to nose and heel to shin testing intact.  Gait normal, Romberg negative.  IMPRESSION: Primary stabbing headache,  improved Obstructive hydrocephalus s/p VP shunt  PLAN: 1.  Start melatonin 3mg  at bedtime.  May increase dose up to 12mg  at bedtime.  If not effective, we can start indomethacin. 2.  Follow up in 4 months.  15 minutes spent face to face with patient, over 50% spent discussing CT results and management.   Metta Clines, DO  CC: Latanya Presser, MD

## 2019-07-22 ENCOUNTER — Other Ambulatory Visit: Payer: Self-pay | Admitting: Neurosurgery

## 2019-07-22 DIAGNOSIS — Q039 Congenital hydrocephalus, unspecified: Secondary | ICD-10-CM

## 2019-09-22 ENCOUNTER — Other Ambulatory Visit: Payer: Self-pay

## 2019-09-22 ENCOUNTER — Ambulatory Visit
Admission: RE | Admit: 2019-09-22 | Discharge: 2019-09-22 | Disposition: A | Discharge status: Home / Self Care | Payer: Medicare Other | Source: Ambulatory Visit | Attending: Neurosurgery | Admitting: Neurosurgery

## 2019-09-22 DIAGNOSIS — Q039 Congenital hydrocephalus, unspecified: Secondary | ICD-10-CM

## 2019-09-22 MED ORDER — GADOBENATE DIMEGLUMINE 529 MG/ML IV SOLN
15.0000 mL | Freq: Once | INTRAVENOUS | Status: AC | PRN
  Administered 2019-09-22: 15 mL via INTRAVENOUS

## 2019-10-25 NOTE — Progress Notes (Signed)
Virtual Visit via Video Note The purpose of this virtual visit is to provide medical care while limiting exposure to the novel coronavirus.    Consent was obtained for video visit:  Yes.   Answered questions that patient had about telehealth interaction:  Yes.   I discussed the limitations, risks, security and privacy concerns of performing an evaluation and management service by telemedicine. I also discussed with the patient that there may be a patient responsible charge related to this service. The patient expressed understanding and agreed to proceed.  Pt location: Home Physician Location: office Name of referring provider:  Audley Hose, MD I connected with Sreshta Dello-Russo at patients initiation/request on 10/27/2019 at  8:50 AM EST by video enabled telemedicine application and verified that I am speaking with the correct person using two identifiers. 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  UPDATE: Taking melatonin 4mg  at bedtime.    Headaches are much better.  She has virtually .  However, she reports that when she takes a large breathes, she has some right sided chest pain.  It does not radiated down her right arm or neck.  It is reportedly not reproducible.  This is a chronic problem.  HISTORY: Ms. Cervenka has VP shunt for obstructive hydrocephalus placed2001 after experiencing frequent headaches. She has had revisions due to shunt malfunction in 2015 and 2017. She hadheadaches since 6 months ago.She has paroxysmal left sided headachein the posterior parietal region, severe stabbing pain lasting 8-9 seconds and occurring multiple times a day and night (5 to 10 times). Some associated lightheadedness.   No associated nausea, vomiting, photophobia, phonophobia, conjunctival injection, ptosis, eye lacrimation, nasal congestion,vertigo, visual disturbance or unilateral numbness or weakness. No triggers and resolves spontaneously.   Eye exam was normal.  CT of head from 08/06/18 was personally reviewed and demonstrated VP shunt via right parietal burr hole with distal tip in left frontal white matter, slit like ventricles, and bifrontal encephalomalacia and right basal ganglia encephalomalacia stable compared to prior CT from 09/02/16. No acute abnormalities or hydrocephalus noted. CTA of head 12/15/18 was unremarkable.   Past medications: Advil, Tylenol  Past Medical History: Past Medical History:  Diagnosis Date  . Chronic cough   . Chronic headaches   . Pleural effusion on right    chronic due to VP shunt  . Ventriculopleural shunt status   . VP (ventriculoperitoneal) shunt status     Medications: Outpatient Encounter Medications as of 10/27/2019  Medication Sig  . meloxicam (MOBIC) 7.5 MG tablet Take 7.5 mg by mouth as needed for pain.   No facility-administered encounter medications on file as of 10/27/2019.    Allergies: No Known Allergies  Family History: Family History  Problem Relation Age of Onset  . Diabetes Father   . Thyroid nodules Sister     Social History: Social History   Socioeconomic History  . Marital status: Married    Spouse name: Not on file  . Number of children: Not on file  . Years of education: Not on file  . Highest education level: Not on file  Occupational History  . Occupation: WAITRESS    Employer: [employer]  Tobacco Use  . Smoking status: Never Smoker  . Smokeless tobacco:  Never Used  Substance and Sexual Activity  . Alcohol use: No  . Drug use: No  . Sexual activity: Yes    Birth control/protection: I.U.D.  Other Topics Concern  . Not on file  Social History Narrative   Caffeine - coffee one cup/ lives with  husband 2 level      Patient speaks [place] and her husband was here to translate for her - signed cone papers to approve family member to interpret   Social Determinants of Health   Financial Resource Strain:   . Difficulty of Paying Living Expenses: Not on file  Food Insecurity:   . Worried About Programme researcher, broadcasting/film/video in the Last Year: Not on file  . Ran Out of Food in the Last Year: Not on file  Transportation Needs:   . Lack of Transportation (Medical): Not on file  . Lack of Transportation (Non-Medical): Not on file  Physical Activity:   . Days of Exercise per Week: Not on file  . Minutes of Exercise per Session: Not on file  Stress:   . Feeling of Stress : Not on file  Social Connections:   . Frequency of Communication with Friends and Family: Not on file  . Frequency of Social Gatherings with Friends and Family: Not on file  . Attends Religious Services: Not on file  . Active Member of Clubs or Organizations: Not on file  . Attends Banker Meetings: Not on file  . Marital Status: Not on file  Intimate Partner Violence:   . Fear of Current or Ex-Partner: Not on file  . Emotionally Abused: Not on file  . Physically Abused: Not on file  . Sexually Abused: Not on file    Observations/Objective:   Height 5\' 5"  (1.651 m), weight 155 lb (70.3 kg). No acute distress.  Alert and oriented.  Speech fluent and not dysarthric.  Language intact.  Eyes orthophoric on primary gaze.  Face symmetric.  Assessment and Plan:   1.  Primary Stabbing Headache 2.  Obstructive Hydrocephalus s/p VP Shunt, followed by neurosurgery, Dr. 3.  Right sided chest pain, chronic.  May be costochondritis.  Recommend following up with PCP.  1.  Melatonin 4mg  at bedtime for headache prophylaxis 2.  Follow up in 6 months.  Follow Up Instructions:    -I discussed the assessment and treatment plan with the patient. The patient was provided an opportunity to ask questions and all were  answered. The patient agreed with the plan and demonstrated an understanding of the instructions.   The patient was advised to call back or seek an in-person evaluation if the symptoms worsen or if the condition fails to improve as anticipated.    Total Time spent in visit with the patient was:  15 minutes  Venetia Maxon, DO

## 2019-10-27 ENCOUNTER — Encounter: Payer: Self-pay | Admitting: Neurology

## 2019-10-27 ENCOUNTER — Telehealth (INDEPENDENT_AMBULATORY_CARE_PROVIDER_SITE_OTHER): Payer: Medicare Other | Admitting: Neurology

## 2019-10-27 ENCOUNTER — Other Ambulatory Visit: Payer: Self-pay

## 2019-10-27 VITALS — Ht 65.0 in | Wt 155.0 lb

## 2019-10-27 DIAGNOSIS — G4485 Primary stabbing headache: Secondary | ICD-10-CM | POA: Diagnosis not present

## 2019-10-27 DIAGNOSIS — R0789 Other chest pain: Secondary | ICD-10-CM | POA: Diagnosis not present

## 2019-10-27 DIAGNOSIS — G8929 Other chronic pain: Secondary | ICD-10-CM

## 2019-10-27 DIAGNOSIS — G911 Obstructive hydrocephalus: Secondary | ICD-10-CM | POA: Diagnosis not present

## 2019-10-27 DIAGNOSIS — Z9689 Presence of other specified functional implants: Secondary | ICD-10-CM

## 2019-11-02 ENCOUNTER — Encounter (HOSPITAL_COMMUNITY): Payer: Self-pay | Admitting: Emergency Medicine

## 2019-11-02 ENCOUNTER — Emergency Department (HOSPITAL_COMMUNITY)
Admission: EM | Admit: 2019-11-02 | Discharge: 2019-11-02 | Disposition: A | Discharge status: Home / Self Care | Payer: Medicare Other | Attending: Emergency Medicine | Admitting: Emergency Medicine

## 2019-11-02 ENCOUNTER — Emergency Department (HOSPITAL_COMMUNITY): Payer: Medicare Other

## 2019-11-02 ENCOUNTER — Other Ambulatory Visit: Payer: Self-pay

## 2019-11-02 DIAGNOSIS — R55 Syncope and collapse: Secondary | ICD-10-CM | POA: Diagnosis not present

## 2019-11-02 DIAGNOSIS — Z982 Presence of cerebrospinal fluid drainage device: Secondary | ICD-10-CM | POA: Diagnosis not present

## 2019-11-02 DIAGNOSIS — R42 Dizziness and giddiness: Secondary | ICD-10-CM | POA: Diagnosis not present

## 2019-11-02 DIAGNOSIS — G911 Obstructive hydrocephalus: Secondary | ICD-10-CM | POA: Insufficient documentation

## 2019-11-02 DIAGNOSIS — E063 Autoimmune thyroiditis: Secondary | ICD-10-CM | POA: Insufficient documentation

## 2019-11-02 DIAGNOSIS — R519 Headache, unspecified: Secondary | ICD-10-CM | POA: Diagnosis not present

## 2019-11-02 NOTE — ED Notes (Signed)
Patient verbalizes understanding of discharge instructions. Opportunity for questioning and answers were provided. Armband removed by staff, pt discharged from ED. Ambulated out to lobby  

## 2019-11-02 NOTE — ED Notes (Signed)
Pt reports she and her husband have had cough x 2 days. Husband had Covid 4 mo's ago

## 2019-11-02 NOTE — ED Notes (Signed)
Pt taken to CT.

## 2019-11-02 NOTE — ED Notes (Signed)
Dr. Maeola Harman is pt's neurosurgeon

## 2019-11-02 NOTE — ED Provider Notes (Signed)
[facility] EMERGENCY DEPARTMENT Provider Note   CSN: [id number] Arrival date & time: 11/02/19  1140     History Chief Complaint  Patient presents with  . Headache  . brain shunt    [name] is a 44 y.o. female. She was interviewed using a Nurse, learning disability, audio, by telemetry.  HPI Patient brought in by EMS with report of syncope and some episodes of presyncope.  She also reports a headache.  She was evaluated by neurology, on 10/27/2019, for a "stabbing headache."  She has a VP shunt for obstructive hydrocephalus, placed in 2001.  She has had revisions to the shunt malfunction in 2015 and 2017.  She has had persistent headaches for several months.  It appears that her last head CT was done in 2020, and she had an MRI of the brain 09/22/2019.  No acute abnormalities were found on either test.  Today, the patient was in her home, working in the kitchen when she suddenly felt dizzy then passed out.  He states the dizziness has been ongoing for more than a week.  The dizziness tends to come and go.  She denies injury to the head, neck or back today.  She accidentally injured her head 2 days ago when she was walking and hit it against a wall.  She denies blurred vision, nausea, vomiting, fever, chills, cough, shortness of breath or chest pain.  There are no other known modifying factors.    Past Medical History:  Diagnosis Date  . Chronic cough   . Chronic headaches   . Pleural effusion on right    chronic due to VP shunt  . Ventriculopleural shunt status   . VP (ventriculoperitoneal) shunt status     Patient Active Problem List   Diagnosis Date Noted  . Hashimoto's thyroiditis 02/18/2019  . 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 Obstructive hydrocephalus (HCC) 08/22/2015  . Malfunction of ventriculo-peritoneal shunt (HCC) 08/20/2015  . Cough 01/27/2015  . Nonfunctioning ventriculoperitoneal shunt (HCC) 05/20/2014  . Cephalalgia   . Pleural effusion, right  05/12/2014  . Fever 05/12/2014  . Headache 05/12/2014  . Malfunction of ventriculoperitoneal shunt (HCC) 12/03/2013  . Cyst of breast 03/17/2012    Past Surgical History:  Procedure Laterality Date  . CRANIOTOMY Left 08/22/2015   Procedure: Left ventricular drain insertion;  Surgeon: Maeola Harman, MD;  Location: [facility] NEURO ORS;  Service: Neurosurgery;  Laterality: Left;  . LAPAROSCOPIC REVISION VENTRICULAR-PERITONEAL (V-P) SHUNT N/A 05/20/2014   Procedure: LAPAROSCOPIC REVISION VENTRICULAR-PERITONEAL (V-P) SHUNT;  Surgeon: Adolph Pollack, MD;  Location: [facility] NEURO ORS;  Service: General;  Laterality: N/A;  . shunt     brain  . SHUNT REMOVAL N/A 05/20/2014   Procedure: shunt removal and placement of laproscopic VP shunt with Dr Abbey Chatters;  Surgeon: Tressie Stalker, MD;  Location: [facility] NEURO ORS;  Service: Neurosurgery;  Laterality: N/A;  . SHUNT REVISION VENTRICULAR-PERITONEAL N/A 12/03/2013   Procedure: Revision of Ventricular Peritoneal Shunt;  Surgeon: Cristi Loron, MD;  Location: [facility] NEURO ORS;  Service: Neurosurgery;  Laterality: N/A;  . VENTRICULOPERITONEAL SHUNT Right 05/20/2014   Procedure: SHUNT INSERTION VENTRICULAR-PERITONEAL;  Surgeon: Tressie Stalker, MD;  Location: [facility] NEURO ORS;  Service: Neurosurgery;  Laterality: Right;     OB History   No obstetric history on file.     Family History  Problem Relation Age of Onset  . Diabetes Father   . Thyroid nodules Sister     Social History   Tobacco Use  . Smoking status:  Never Smoker  . Smokeless tobacco: Never Used  Substance Use Topics  . Alcohol use: No  . Drug use: No    Home Medications Prior to Admission medications   Medication Sig Start Date End Date Taking? Authorizing Provider  meloxicam (MOBIC) 7.5 MG tablet Take 7.5 mg by mouth as needed for pain.    [provider]    Allergies    Patient has no known allergies.  Review of Systems   Review of Systems  All other systems reviewed and are  negative.   Physical Exam Updated Vital Signs BP 126/76   Pulse 66   Temp 98.5 F (36.9 C) (Oral)   Resp 12   Wt 68 kg   SpO2 98%   BMI 24.96 kg/m   Physical Exam Vitals and nursing note reviewed.  Constitutional:      General: She is not in acute distress.    Appearance: She is well-developed. She is not ill-appearing, toxic-appearing or diaphoretic.  HENT:     Head: Normocephalic.     Comments: Right parietal shunt, extending along the occipital area.  The area is moderately tender but does not feel swollen or deformed in any way.    Right Ear: External ear normal.     Left Ear: External ear normal.  Eyes:     Conjunctiva/sclera: Conjunctivae normal.     Pupils: Pupils are equal, round, and reactive to light.  Neck:     Trachea: Phonation normal.  Cardiovascular:     Rate and Rhythm: Normal rate and regular rhythm.     Heart sounds: Normal heart sounds.  Pulmonary:     Effort: Pulmonary effort is normal.     Breath sounds: Normal breath sounds.  Abdominal:     Palpations: Abdomen is soft.     Tenderness: There is no abdominal tenderness.  Musculoskeletal:        General: Normal range of motion.     Cervical back: Normal range of motion and neck supple.     Comments: No tenderness to the posterior cervical, thoracic or lumbar spines.  Skin:    General: Skin is warm and dry.  Neurological:     Mental Status: She is alert and oriented to person, place, and time.     Cranial Nerves: No cranial nerve deficit.     Sensory: No sensory deficit.     Motor: No abnormal muscle tone.     Coordination: Coordination normal.     Comments: No dysarthria or aphasia.  No pronator drift.  No nystagmus.  Psychiatric:        Mood and Affect: Mood normal.        Behavior: Behavior normal.        Thought Content: Thought content normal.        Judgment: Judgment normal.     ED Results / Procedures / Treatments   Labs (all labs ordered are listed, but only abnormal results are  displayed) Labs Reviewed - No data to display  EKG EKG Interpretation  Date/Time:  Monday November 02 2019 11:59:07 EST Ventricular Rate:  69 PR Interval:    QRS Duration: 114 QT Interval:  411 QTC Calculation: 441 R Axis:   41 Text Interpretation: Sinus rhythm Borderline intraventricular conduction delay Low voltage, precordial leads since last tracing no significant change Confirmed by Mancel Bale [phone]) on 11/02/2019 2:16:46 PM   Radiology CT Head Wo Contrast  Result Date: 11/02/2019 CLINICAL DATA:  Recurrent syncope EXAM: CT HEAD WITHOUT  CONTRAST TECHNIQUE: Contiguous axial images were obtained from the base of the skull through the vertex without intravenous contrast. COMPARISON:  MR brain, 09/22/2019, CT head angiogram, 12/15/2018 FINDINGS: Brain: No evidence of acute infarction, hemorrhage, hydrocephalus, extra-axial collection or mass lesion/mass effect. Redemonstrated right parietal approach intraventricular shunt catheter, tip in the vicinity of the left corona radiata, with decompressed lateral ventricles. There are previous bilateral frontal approach shunt tracts. Vascular: No hyperdense vessel or unexpected calcification. Skull: Multiple bilateral burr hole craniotomy sites. Negative for fracture or focal lesion. Sinuses/Orbits: No acute finding. Other: None. IMPRESSION: No acute intracranial pathology. Unchanged caliber and configuration of the ventricles, generally decompressed, with right parietal approach intraventricular shunt catheter. Electronically Signed   By: Eddie Candle M.D.   On: 11/02/2019 13:38    Procedures Procedures (including critical care time)  Medications Ordered in ED Medications - No data to display  ED Course  I have reviewed the triage vital signs and the nursing notes.  Pertinent labs & imaging results that were available during my care of the patient were reviewed by me and considered in my medical decision making (see chart for details).      MDM Rules/Calculators/A&P                       Patient Vitals for the past 24 hrs:  BP Temp Temp src Pulse Resp SpO2 Weight  11/02/19 1230 126/76 -- -- 66 12 98 % --  11/02/19 1215 119/86 -- -- 70 16 99 % --  11/02/19 1201 -- 98.5 F (36.9 C) Oral -- -- -- 68 kg  11/02/19 1155 -- -- -- -- -- 99 % --    2:34 PM Reevaluation with update and discussion. After initial assessment and treatment, an updated evaluation reveals she is comfortable now has no further complaints.  Vital signs are reassuring.  Findings discussed and questions were answered. Daleen Bo   Medical Decision Making: Nonspecific dizziness with history of hydrocephalus.  Shunt normal and no acute intracranial abnormalities on CT imaging.  Neurologic exam is normal and no evidence for acute toxic or metabolic problems.  CRITICAL CARE-no Performed by: Daleen Bo   Nursing Notes Reviewed/ Care Coordinated Applicable Imaging Reviewed Interpretation of Laboratory Data incorporated into ED treatment  The patient appears reasonably screened and/or stabilized for discharge and I doubt any other medical condition or other [facility] requiring further screening, evaluation, or treatment in the ED at this time prior to discharge.  Plan: Home Medications-continue usual; Home Treatments-rest, fluids, gradual advance activity; return here if the recommended treatment, does not improve the symptoms; Recommended follow up-PCP and neurosurgery, as needed.    Final Clinical Impression(s) / ED Diagnoses Final diagnoses:  Syncope, unspecified syncope type  Dizziness    Rx / DC Orders ED Discharge Orders    None       Daleen Bo, MD 11/02/19 1438

## 2019-11-02 NOTE — Discharge Instructions (Signed)
Testing to evaluate your dizziness and syncope, was normal.  There is no sign of shunt malfunction or injury to the brain.  Make sure you are taking your usual medicines, eating and drinking normally and follow-up with your doctors as needed for problems.

## 2019-11-02 NOTE — ED Triage Notes (Signed)
Pt in via [facility] for c/o near syncope and head pain near her shunt R parietal area. Hx of 36 brain surgeries and shunt placed back in 2001. Denies any n/v, reports a HA x 1 wk and syncopal event 2 nights ago while walking her dog. Bystander helped her to the ground and she did not hit her head. This am she has been more dizzy and more head pain when standing.

## 2020-04-25 NOTE — Progress Notes (Deleted)
NEUROLOGY FOLLOW UP OFFICE NOTE  [name] [id number]  HISTORY OF PRESENT ILLNESS: [name] is a 44 year old woman with history of obstructive hydrocephalus status post VP shunt and non-toxic goiter who follows up for primary stabbing headache  UPDATE: Taking melatonin 4mg  at bedtime.    ***.  She was seen in the ED for dizziness and syncope about a week after last visit in January.  CT head personally reviewed showed no acute intracranial abnormality.    HISTORY: [name] has VP shunt for obstructive hydrocephalus placed2001 after experiencing frequent headaches. She has had revisions due to shunt malfunction in 2015 and 2017. She hadheadaches since 6 months ago.She has paroxysmal left sided headachein the posterior parietal region, severe stabbing pain lasting 8-9 seconds and occurring multiple times a day and night (5 to 10 times). Some associated lightheadedness.  No associated nausea, vomiting, photophobia, phonophobia, conjunctival injection, ptosis, eye lacrimation, nasal congestion,vertigo, visual disturbance or unilateral numbness or weakness. No triggers and resolves spontaneously.   Eye exam was normal.  CT of head from 08/06/18 was personally reviewed and demonstrated VP shunt via right parietal burr hole with distal tip in left frontal white matter, slit like ventricles, and bifrontal encephalomalacia and right basal ganglia encephalomalacia stable compared to prior CT from 09/02/16. No acute abnormalities or hydrocephalus noted. CTA of head 12/15/18 was unremarkable.   Past medications: Advil, Tylenol  PAST MEDICAL HISTORY: Past Medical History:  Diagnosis Date  . Chronic cough   . Chronic headaches   . Pleural effusion on right    chronic due to VP shunt  . Ventriculopleural shunt status   . VP (ventriculoperitoneal) shunt status     MEDICATIONS: Current Outpatient Medications on File Prior to Visit  Medication Sig  Dispense Refill  . meloxicam (MOBIC) 7.5 MG tablet Take 7.5 mg by mouth as needed for pain.     No current facility-administered medications on file prior to visit.    ALLERGIES: No Known Allergies  FAMILY HISTORY: Family History  Problem Relation Age of Onset  . Diabetes Father   . Thyroid nodules Sister     SOCIAL HISTORY: Social History   Socioeconomic History  . Marital status: Married    Spouse name: Not on file  . Number of children: Not on file  . Years of education: Not on file  . Highest education level: Not on file  Occupational History  . 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 Smoking status: Never Smoker  . Smokeless tobacco: Never Used  Substance and Sexual Activity  . Alcohol use: No  . Drug use: No  . Sexual activity: Yes    Birth control/protection: I.U.D.  Other Topics Concern  . Not on file  Social History Narrative   Caffeine - coffee one cup/ lives with husband 2 level      Patient speaks [employer] and her husband was here to translate for her - signed cone papers to approve family member to interpret   Social Determinants of Health   Financial Resource Strain:   . Difficulty of Paying Living Expenses:   Food Insecurity:   . Worried About [place] in the Last Year:   . Programme researcher, broadcasting/film/video in the Last Year:   Transportation Needs:   . Barista (Medical):   Freight forwarder Lack of Transportation (Non-Medical):   Physical Activity:   . Days of Exercise per Week:   . Minutes of  Exercise per Session:   Stress:   . Feeling of Stress :   Social Connections:   . Frequency of Communication with Friends and Family:   . Frequency of Social Gatherings with Friends and Family:   . Attends Religious Services:   . Active Member of Clubs or Organizations:   . Attends Banker Meetings:   Marland Kitchen Marital Status:   Intimate Partner Violence:   . Fear of Current or Ex-Partner:   . Emotionally Abused:   Marland Kitchen  Physically Abused:   . Sexually Abused:     PHYSICAL EXAM: *** General: No acute distress.  Patient appears well-groomed.   Head:  Normocephalic/atraumatic Eyes:  Fundi examined but not visualized Neck: supple, no paraspinal tenderness, full range of motion Heart:  Regular rate and rhythm Lungs:  Clear to auscultation bilaterally Back: No paraspinal tenderness Neurological Exam: alert and oriented to person, place, and time. Attention span and concentration intact, recent and remote memory intact, fund of knowledge intact.  Speech fluent and not dysarthric, language intact.  CN II-XII intact. Bulk and tone normal, muscle strength 5/5 throughout.  Sensation to light touch, temperature and vibration intact.  Deep tendon reflexes 2+ throughout, toes downgoing.  Finger to nose and heel to shin testing intact.  Gait normal, Romberg negative.  IMPRESSION: 1.  Primary stabbing headache 2.  Obstructive hydrocephalus s/p VP shunt, followed by Dr. Venetia Maxon of neurosurgery   PLAN: ***  Shon Millet, DO  CC: ***

## 2020-04-26 ENCOUNTER — Ambulatory Visit: Payer: Medicare Other | Admitting: Neurology

## 2020-06-29 ENCOUNTER — Other Ambulatory Visit: Payer: Self-pay | Admitting: Internal Medicine

## 2020-06-29 DIAGNOSIS — E049 Nontoxic goiter, unspecified: Secondary | ICD-10-CM

## 2020-07-12 ENCOUNTER — Other Ambulatory Visit: Payer: Medicare Other

## 2020-07-13 ENCOUNTER — Other Ambulatory Visit: Payer: Self-pay | Admitting: Student

## 2020-09-08 NOTE — Progress Notes (Deleted)
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  She is accompanied by her husband who supplements history.  UPDATE: Taking melatonin 4mg  at bedtime.    Last seen on 10/27/2019.  She was seen in the ED on 11/02/2019 for syncopal spell.  CT head unremarkable.  ***  HISTORY: Ms. Kunzman has VP shunt for obstructive hydrocephalus placed2001 after experiencing frequent headaches. She has had revisions due to shunt malfunction in 2015 and 2017. She hadheadaches since 6 months ago.She has paroxysmal left sided headachein the posterior parietal region, severe stabbing pain lasting 8-9 seconds and occurring multiple times a day and night (5 to 10 times). Some associated lightheadedness.  No associated nausea, vomiting, photophobia, phonophobia, conjunctival injection, ptosis, eye lacrimation, nasal congestion,vertigo, visual disturbance or unilateral numbness or weakness. No triggers and resolves spontaneously.   Eye exam was normal.  CT of head from 08/06/18 was personally reviewed and demonstrated VP shunt via right parietal burr hole with distal tip in left frontal white matter, slit like ventricles, and bifrontal encephalomalacia and right basal ganglia encephalomalacia stable compared to prior CT from 09/02/16. No acute abnormalities or hydrocephalus noted. CTA of head 12/15/18 was unremarkable.  MRI of brain with and without contrast on 09/22/2019 showed properly placed shunt with no evidence of hydrocephalus and numerous other defunct shunt tracks with associated gliosis and hemosiderin deposition.   Past medications: Advil, Tylenol  PAST MEDICAL HISTORY: Past Medical History:  Diagnosis Date  . Chronic cough   . Chronic headaches   . 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 Ventriculopleural shunt status   . VP (ventriculoperitoneal) shunt status     MEDICATIONS: Current Outpatient Medications on File Prior to Visit  Medication Sig Dispense Refill  . meloxicam (MOBIC) 7.5 MG tablet Take 7.5 mg by mouth as needed for pain.     No current facility-administered medications on file prior to visit.    ALLERGIES: No Known Allergies  FAMILY HISTORY: Family History  Problem Relation Age of Onset  . Diabetes Father   . Thyroid nodules Sister     SOCIAL HISTORY: Social History   Socioeconomic History  . Marital status: Married    Spouse name: Not on file  . Number of children: Not on file  . Years of education: Not on file  . Highest education level: Not on file  Occupational History  . 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 Smoking status: Never Smoker  . Smokeless tobacco: Never Used  Substance and Sexual Activity  . Alcohol use: No  . Drug use: No  . Sexual activity: Yes    Birth control/protection: I.U.D.  Other Topics Concern  . Not on file  Social History Narrative   Caffeine - coffee one cup/ lives with husband 2 level      Patient speaks [employer] and her husband was here to translate for her - signed cone papers to approve family member to interpret   Social Determinants of Health   Financial Resource Strain:   . Difficulty of Paying Living Expenses: Not on file  Food Insecurity:   . Worried About [place] in the Last Year: Not on file  . Ran Out of Food in the Last Year: Not  on file  Transportation Needs:   . Lack of Transportation (Medical): Not on file  . Lack of Transportation (Non-Medical): Not on file  Physical Activity:   . Days of Exercise per Week: Not on file  . Minutes of Exercise per Session: Not on file  Stress:   . Feeling of Stress : Not on file  Social Connections:   . Frequency of Communication with Friends and Family: Not on file  .  Frequency of Social Gatherings with Friends and Family: Not on file  . Attends Religious Services: Not on file  . Active Member of Clubs or Organizations: Not on file  . Attends Banker Meetings: Not on file  . Marital Status: Not on file  Intimate Partner Violence:   . Fear of Current or Ex-Partner: Not on file  . Emotionally Abused: Not on file  . Physically Abused: Not on file  . Sexually Abused: Not on file     Objective:  *** General: No acute distress.  Patient appears well-groomed.   Head:  Normocephalic/atraumatic Eyes:  Fundi examined but not visualized Neck: supple, no paraspinal tenderness, full range of motion Heart:  Regular rate and rhythm Lungs:  Clear to auscultation bilaterally Back: No paraspinal tenderness Neurological Exam: alert and oriented to person, place, and time. Attention span and concentration intact, recent and remote memory intact, fund of knowledge intact.  Speech fluent and not dysarthric, language intact.  CN II-XII intact. Bulk and tone normal, muscle strength 5/5 throughout.  Sensation to light touch, temperature and vibration intact.  Deep tendon reflexes 2+ throughout, toes downgoing.  Finger to nose and heel to shin testing intact.  Gait normal, Romberg negative.   Assessment/Plan:   1.  Primary stabbing headache 2.  History of congenital hydrocephalus s/p VP shunt, functioning. 3.  Syncope  ***  Shon Millet, DO  CC: Jamison Oka, MD

## 2020-09-12 ENCOUNTER — Ambulatory Visit: Payer: Medicare Other | Admitting: Neurology

## 2020-11-15 ENCOUNTER — Other Ambulatory Visit: Payer: Self-pay | Admitting: Radiology

## 2021-04-04 ENCOUNTER — Ambulatory Visit: Payer: Medicare Other | Admitting: Neurology

## 2021-08-29 ENCOUNTER — Other Ambulatory Visit: Payer: Self-pay | Admitting: Internal Medicine

## 2021-08-29 DIAGNOSIS — E049 Nontoxic goiter, unspecified: Secondary | ICD-10-CM

## 2021-08-29 DIAGNOSIS — Z1231 Encounter for screening mammogram for malignant neoplasm of breast: Secondary | ICD-10-CM

## 2021-09-18 ENCOUNTER — Ambulatory Visit
Admission: RE | Admit: 2021-09-18 | Discharge: 2021-09-18 | Disposition: A | Discharge status: Home / Self Care | Payer: Medicare Other | Source: Ambulatory Visit | Attending: Internal Medicine | Admitting: Internal Medicine

## 2021-09-18 DIAGNOSIS — E049 Nontoxic goiter, unspecified: Secondary | ICD-10-CM

## 2021-10-10 ENCOUNTER — Ambulatory Visit: Payer: Medicare Other | Admitting: Neurology

## 2021-10-16 ENCOUNTER — Ambulatory Visit
Admission: RE | Admit: 2021-10-16 | Discharge: 2021-10-16 | Disposition: A | Discharge status: Home / Self Care | Payer: Medicare Other | Source: Ambulatory Visit | Attending: Internal Medicine | Admitting: Internal Medicine

## 2021-10-16 DIAGNOSIS — Z1231 Encounter for screening mammogram for malignant neoplasm of breast: Secondary | ICD-10-CM

## 2021-12-22 NOTE — Progress Notes (Signed)
? ?  NEUROLOGY FOLLOW UP OFFICE NOTE ? ?[name] ?YC:[id number] ? ?Assessment/Plan:  ? ?1.  Primary Stabbing Headache, resolved ?2.  Obstructive Hydrocephalus s/p VP Shunt, stable ?  ?1.  Follow up as needed ? 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  She is accompanied by her husband ?  ?UPDATE: ?No longer on melatonin.  Doing well.  No headaches.   ?  ?HISTORY: ?Ms. Johns has VP shunt for obstructive hydrocephalus placed 2001 after experiencing frequent headaches.  She has had revisions due to shunt malfunction in 2015 and 2017.  She had headaches since 6 months ago.  She has paroxysmal left sided headache in the posterior parietal region, severe stabbing pain lasting 8-9 seconds and occurring multiple times a day and night (5 to 10 times).  Some associated lightheadedness.  No associated nausea, vomiting, photophobia, phonophobia, conjunctival injection, ptosis, eye lacrimation, nasal congestion, vertigo, visual disturbance or unilateral numbness or weakness.  No triggers and resolves spontaneously.   ?  ?Eye exam was normal. ?  ?CT of head from 08/06/18 was personally reviewed and demonstrated VP shunt via right parietal burr hole with distal tip in left frontal white matter, slit like ventricles, and bifrontal encephalomalacia and right basal ganglia encephalomalacia stable compared to prior CT from 09/02/16.  No acute abnormalities or hydrocephalus noted.  CTA of head 12/15/18 was unremarkable. ?  ?  ?Past medications:  Advil, Tylenol ? ?PAST MEDICAL HISTORY: ?Past Medical History:  ?Diagnosis Date  ? Chronic cough   ? Chronic headaches   ? Pleural effusion on right   ? chronic due to VP shunt  ? Ventriculopleural shunt status   ? VP (ventriculoperitoneal) shunt status   ? ? ?MEDICATIONS: ?Current Outpatient Medications on File Prior to Visit   ?Medication Sig Dispense Refill  ? meloxicam (MOBIC) 7.5 MG tablet Take 7.5 mg by mouth as needed for pain.    ? ?No current facility-administered medications on file prior to visit.  ? ? ?ALLERGIES: ?No Known Allergies ? ?FAMILY HISTORY: ?Family History  ?Problem Relation Age of Onset  ? Diabetes Father   ? Thyroid nodules Sister   ? ? ?  ?Objective:  ?Blood pressure 112/61, pulse 75, height 5\' 5"  (1.651 m), weight 171 lb 12.8 oz (77.9 kg), SpO2 98 %. ?General: No acute distress.  Patient appears well-groomed.   ?Head:  Normocephalic/atraumatic ?Eyes:  Fundi examined but not visualized ?Neck: supple, no paraspinal tenderness, full range of motion ?Heart:  Regular rate and rhythm ?Lungs:  Clear to auscultation bilaterally ?Back: No paraspinal tenderness ?Neurological Exam: alert and oriented to person, place, and time.  Speech fluent and not dysarthric, language intact.  CN II-XII intact. Bulk and tone normal, muscle strength 5/5 throughout.  Sensation to light touch intact.  Deep tendon reflexes 2+ throughout, toes downgoing.  Finger to nose testing intact.  Gait normal, Romberg negative. ? ? ?Metta Clines, DO ? ?CC: Latanya Presser, MD ? ? ? ? ? ? ?

## 2021-12-25 ENCOUNTER — Ambulatory Visit (INDEPENDENT_AMBULATORY_CARE_PROVIDER_SITE_OTHER): Payer: Medicare Other | Admitting: Neurology

## 2021-12-25 ENCOUNTER — Other Ambulatory Visit: Payer: Self-pay

## 2021-12-25 ENCOUNTER — Encounter: Payer: Self-pay | Admitting: Neurology

## 2021-12-25 VITALS — BP 112/61 | HR 75 | Ht 65.0 in | Wt 171.8 lb

## 2021-12-25 DIAGNOSIS — G911 Obstructive hydrocephalus: Secondary | ICD-10-CM

## 2021-12-25 DIAGNOSIS — G4485 Primary stabbing headache: Secondary | ICD-10-CM

## 2022-03-02 IMAGING — MG MM DIGITAL SCREENING BILAT W/ TOMO AND CAD
8 series · 8 of 24 positions shown · non-contrast
Comparison: Previous exam(s).

CLINICAL DATA: Screening.

EXAM:
DIGITAL SCREENING BILATERAL MAMMOGRAM WITH TOMOSYNTHESIS AND CAD
TECHNIQUE: Bilateral screening digital craniocaudal and mediolateral oblique
mammograms were obtained. Bilateral screening digital breast
tomosynthesis was performed. The images were evaluated with
computer-aided detection.

[L MLO synth-2D]
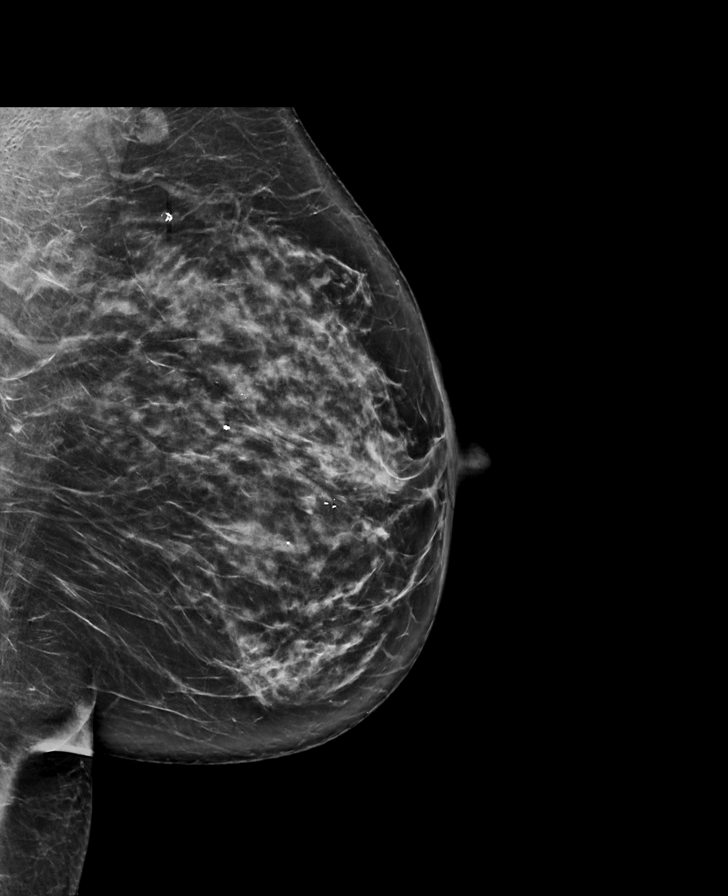

[L CC synth-2D]
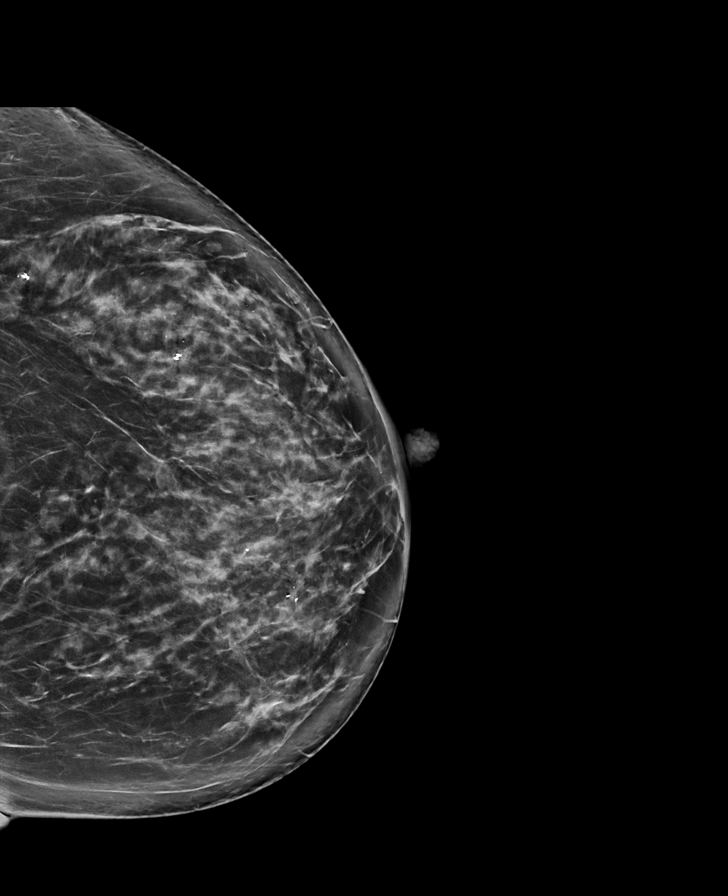

[R CC synth-2D]
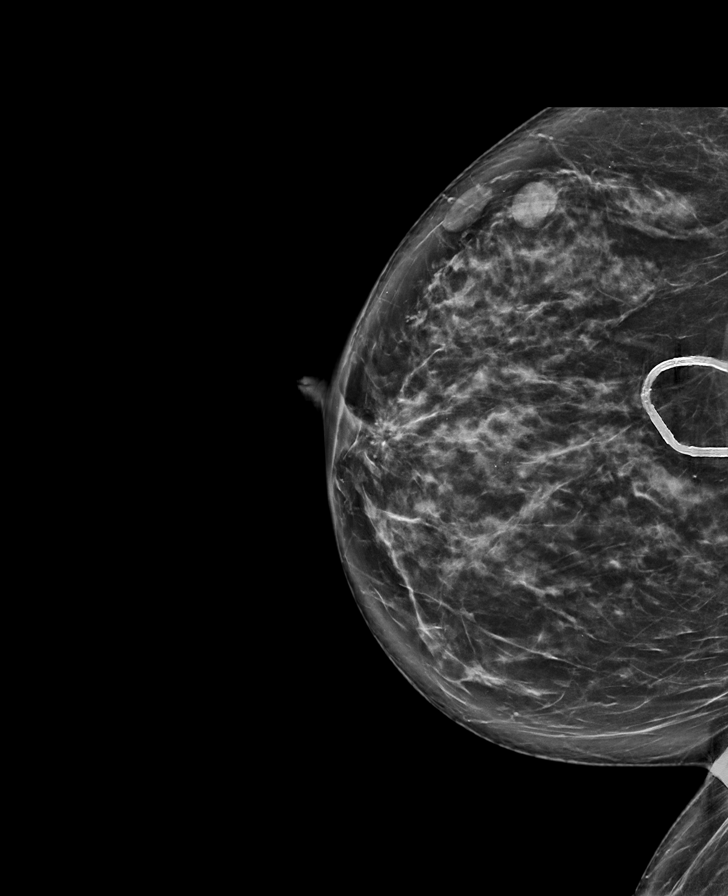

[R MLO synth-2D]
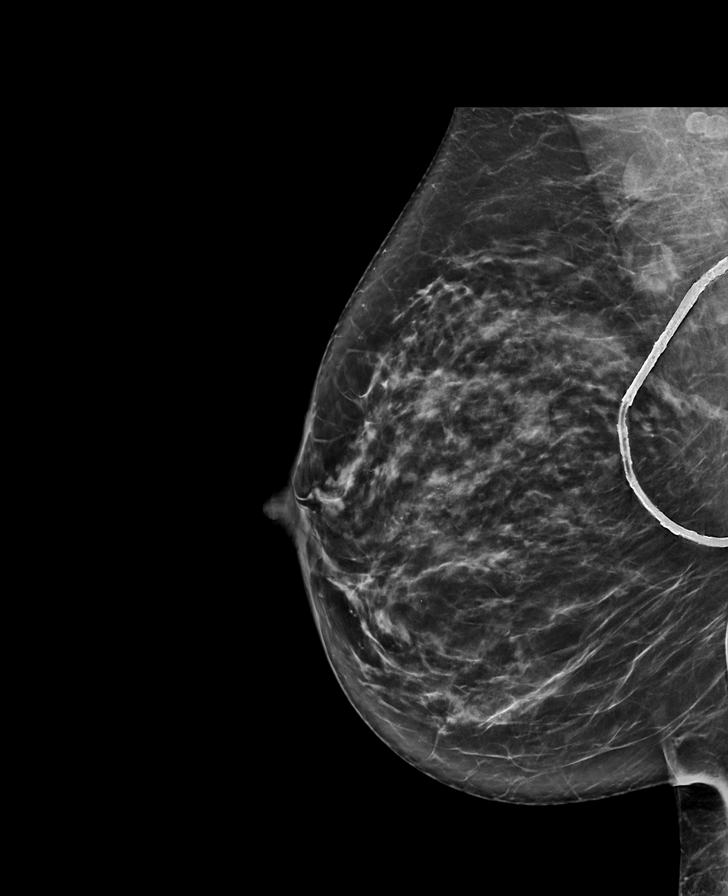

[L MLO tomo · tomo slice 43/84.0]
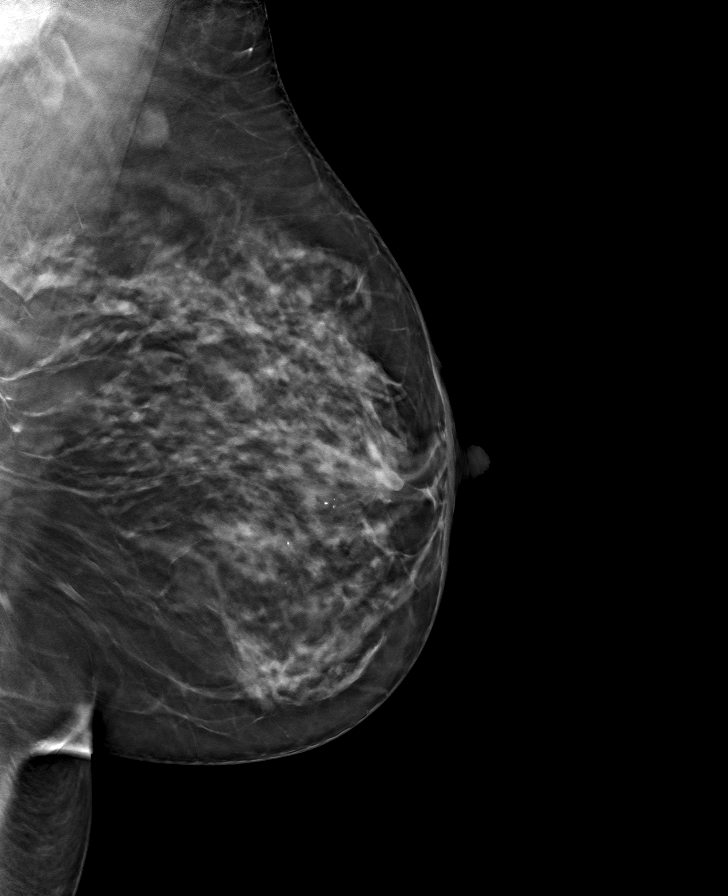

[L CC tomo · tomo slice 44/87.0]
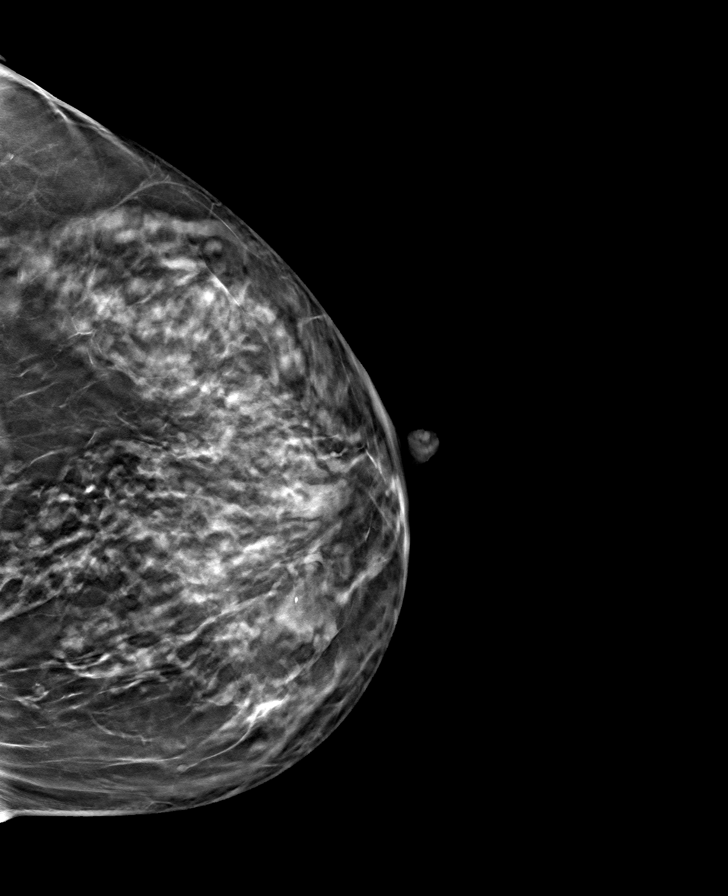

[R CC tomo · tomo slice 43/85.0]
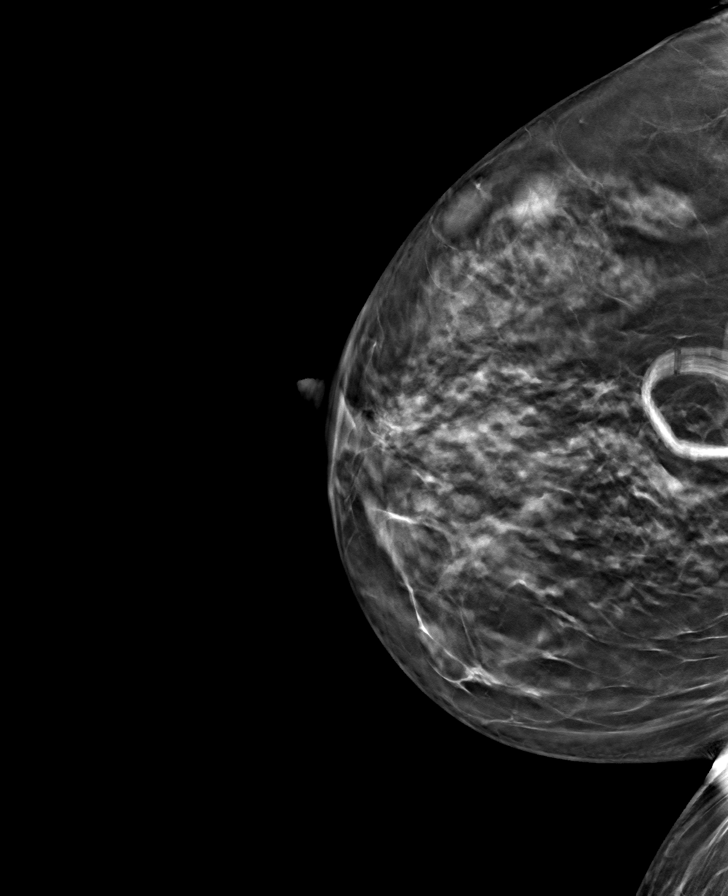

[R MLO tomo · tomo slice 41/82.0]
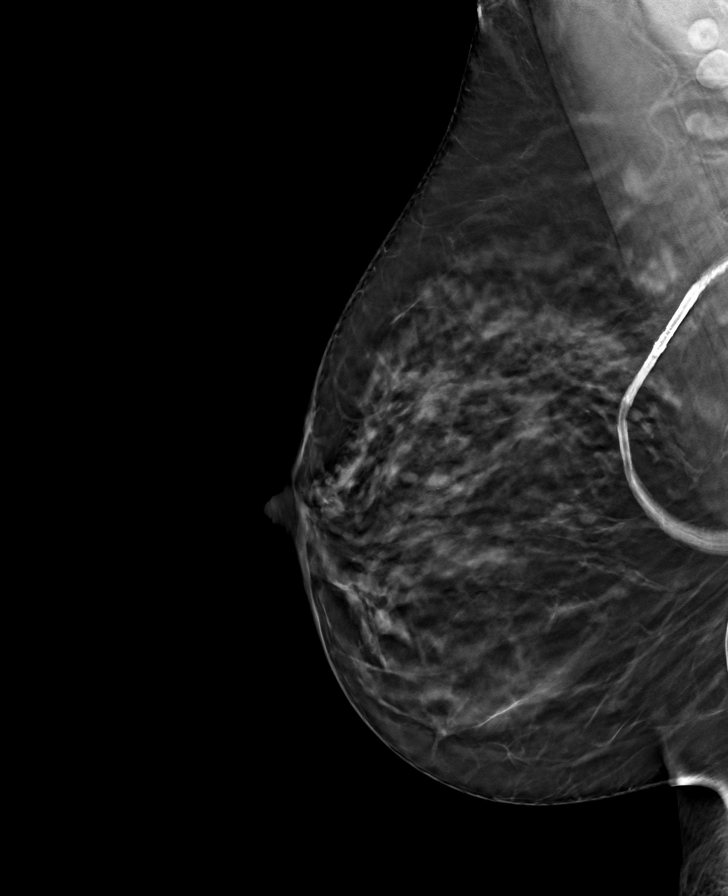

[8 of 24 positions shown; findings below may reference images not displayed]

ACR Breast Density Category c: The breast tissue is heterogeneously
dense, which may obscure small masses.
FINDINGS: There are no findings suspicious for malignancy.
IMPRESSION: No mammographic evidence of malignancy. A result letter of this
screening mammogram will be mailed directly to the patient.

RECOMMENDATION:
Screening mammogram in one year. (Code:Q3-W-BC3)

BI-RADS CATEGORY  1: Negative.

## 2022-09-03 ENCOUNTER — Telehealth: Payer: Self-pay | Admitting: Hematology and Oncology

## 2022-09-03 NOTE — Telephone Encounter (Signed)
Spoke with patient confirming new patient appointment 12/4 

## 2022-09-10 ENCOUNTER — Other Ambulatory Visit: Payer: Self-pay

## 2022-09-10 ENCOUNTER — Inpatient Hospital Stay: Payer: Medicare Other | Attending: Hematology and Oncology | Admitting: Hematology and Oncology

## 2022-09-10 ENCOUNTER — Inpatient Hospital Stay: Payer: Medicare Other

## 2022-09-10 VITALS — BP 114/55 | HR 72 | Temp 97.5°F | Resp 14 | Ht 64.0 in | Wt 172.7 lb

## 2022-09-10 DIAGNOSIS — Z803 Family history of malignant neoplasm of breast: Secondary | ICD-10-CM | POA: Insufficient documentation

## 2022-09-10 DIAGNOSIS — Z1501 Genetic susceptibility to malignant neoplasm of breast: Secondary | ICD-10-CM | POA: Insufficient documentation

## 2022-09-10 NOTE — Progress Notes (Signed)
[facility] CONSULT NOTE  Patient Care Team: Harvest Forest, MD as PCP - General (Internal Medicine) Drema Dallas, DO as Consulting Physician (Neurology)  CHIEF COMPLAINTS/PURPOSE OF CONSULTATION:  Family history of breast cancer.  ASSESSMENT & PLAN:   We discussed her life time risk of breast cancer and 5 yr risk of breast cancer today which comes to around 11% without her mom's history of breast cancer.  Given her risk of breast cancer being very close to general population, I do not recommend adding MRI for breast cancer screening.  From review of family history, it does not appear that she will qualify for any genetic testing at this time.  Although we have discussed about MRIs before learning more about her mom's history, we agreed that she may be able to just continue mammograms alone.  2. Lifestyle modification: We discussed different interventions exercise at least 5 days a week including both aerobic as well as weight-bearing exercises and strength training. He discussed dietary modification including increasing number of servings of fruit and vegetables as well as decreased in number of servings of meat. We discussed the importance of maintaining a good BMI and limiting alcohol intake.  She is already following the diet, eats plenty of vegetables, does not drink any alcohol.  3. Surveillance: Patient certainly would be a good candidate for ongoing mammograms on a yearly basis. Certainly she could benefit from a 3-D mammogram. We discussed self breast examination as well as ongoing clinical examination.   4. Chemoprevention: No role for chemoprevention.  5. Genetics: Genetic testing not ordered, at this time she does not qualify based on her family history  She can continue to follow up with her PCP and return to clinic with high risk breast clinic as needed. She is agreeable to all these recommendations.  Thank you for consulting [place] the care of this patient.   Please do not hesitate to contact us with any additional questions or concerns  HISTORY OF PRESENTING ILLNESS:  [name] 46 y.o. female is here because of family history of breast cancer.  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  Patient arrived with her sister to this appointment.  She is [place], mentions that mom had breast cancer in her early 32s and and lumpectomy and no additional treatment.  No other family history of breast cancer, ovarian cancer, pancreatic cancer, prostate cancer.  1 sister had melanoma. She she is healthy at baseline, eats very healthy, denies any abnormal mammograms or biopsies in the past.  She uses Mirena IUD, no history of hormone replacement therapy.  She has 1 child who is 37 years old and breast-fed him for about 3 years.  About halfway through the conversation, she told me that she wanted to confirm about her mother and called her back in [place].  Her mom told her that she had a cyst and not a cancer.  She then mentioned to me that there is no other family history of breast cancer. Rest of the pertinent 10 point ROS reviewed and negative  REVIEW OF SYSTEMS:   Constitutional: Denies fevers, chills or abnormal night sweats Eyes: Denies blurriness of vision, double vision or watery eyes Ears, nose, mouth, throat, and face: Denies mucositis or sore throat Respiratory: Denies cough, dyspnea or wheezes Cardiovascular: Denies palpitation, chest discomfort or  lower extremity swelling Gastrointestinal:  Denies nausea, heartburn or change in bowel habits Skin: Denies abnormal skin rashes Lymphatics: Denies new lymphadenopathy or easy bruising Neurological:Denies numbness, tingling or new weaknesses Behavioral/Psych: Mood is stable, no new changes  All other systems  were reviewed with the patient and are negative.  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     SURGICAL HISTORY: Past Surgical History:  Procedure Laterality Date   CRANIOTOMY Left 08/22/2015   Procedure: Left ventricular drain insertion;  Surgeon: Maeola Harman, MD;  Location: [facility] NEURO ORS;  Service: Neurosurgery;  Laterality: Left;   LAPAROSCOPIC REVISION VENTRICULAR-PERITONEAL (V-P) SHUNT N/A 05/20/2014   Procedure: LAPAROSCOPIC REVISION VENTRICULAR-PERITONEAL (V-P) SHUNT;  Surgeon: Adolph Pollack, MD;  Location: [facility] NEUR[facility];  Service: General;  Laterality: N/A;   shunt     brain   SHUNT REMOVAL N/A 05/20/2014   Procedure: shunt removal and placement of laproscopic VP shunt with Dr Abbey Chatters;  Surgeon: Tressie Stalker, MD;  Location: [facility];  Service: Neurosurgery;  Laterality: N/A;   SHUNT REVISION VENTRICULAR-PERITONEAL N/A 12/03/2013   Procedure: Revision of Ventricular Peritoneal Shunt;  Surgeon: Cristi Loron, MD;  Location: [facility] NEURO ORS;  Service: Neurosurgery;  Laterality: N/A;   VENTRICULOPERITONEAL SHUNT Right 05/20/2014   Procedure: SHUNT INSERTION VENTRICULAR-PERITONEAL;  Surgeon: Tressie Stalker, MD;  Location: [facility] NEURO ORS;  Service: Neurosurgery;  Laterality: Right;    SOCIAL HISTORY: Social History   Socioeconomic History   Marital status: Married    Spouse name: Not on file   Number of children: Not on file   Years of education: Not on file   Highest education level: Not on file  Occupational History   Occupation: WAITRESS    Employer: [employer]  Tobacco Use   Smoking status: Never   Smokeless tobacco: Never  Substance and Sexual Activity   Alcohol use: No   Drug use: No   Sexual activity: Yes    Birth control/protection: I.U.D.  Other Topics Concern    Not on file  Social History Narrative   Caffeine - coffee one cup/ lives with husband 2 level      Patient speaks [place] and her husband was here to translate for her - signed cone papers to approve family member to interpret   Social Determinants of Health   Financial Resource Strain: Not on file  Food Insecurity: Not on file  Transportation Needs: Not on file  Physical Activity: Not on file  Stress: Not on file  Social Connections: Not on file  Intimate Partner Violence: Not on file    FAMILY HISTORY: Family History  Problem Relation Age of Onset   Diabetes Father    Thyroid nodules Sister     ALLERGIES:  has No Known Allergies.  MEDICATIONS:  Current Outpatient Medications  Medication Sig Dispense Refill   meloxicam (MOBIC) 7.5 MG tablet Take 7.5 mg by mouth as needed for pain. (Patient not taking: Reported on 12/25/2021)     No current facility-administered medications for this visit.     PHYSICAL EXAMINATION: ECOG PERFORMANCE STATUS: 0 - Asymptomatic  Vitals:   09/10/22 1054  BP: (!) 114/55  Pulse: 72  Resp: 14  Temp: (!) 97.5 F (36.4 C)  SpO2: 99%   Filed Weights  09/10/22 1054  Weight: 172 lb 11.2 oz (78.3 kg)    GENERAL:alert, no distress and comfortable Neck: No cervical adenopathy Breast: Bilateral breasts inspected.  No palpable masses or regional adenopathy  LABORATORY DATA:  I have reviewed the data as listed Lab Results  Component Value Date   WBC 9.1 09/02/2016   HGB 14.5 09/02/2016   HCT 43.3 09/02/2016   MCV 86.1 09/02/2016   PLT 194 09/02/2016     Chemistry      Component Value Date/Time   NA 139 09/02/2016 0338   K 3.0 (L) 09/02/2016 0338   CL 108 09/02/2016 0338   CO2 23 09/02/2016 0338   BUN 9 09/02/2016 0338   CREATININE 0.75 09/02/2016 0338   CREATININE 0.69 12/24/2013 0813      Component Value Date/Time   CALCIUM 9.1 09/02/2016 0338   ALKPHOS 52 08/20/2015 1530   AST 18 08/20/2015 1530   ALT 17 08/20/2015  1530   BILITOT 0.5 08/20/2015 1530      RADIOGRAPHIC STUDIES: I have personally reviewed the radiological images as listed and agreed with the findings in the report. No results found.  All questions were answered. The patient knows to call the clinic with any problems, questions or concerns. I spent 35 minutes in the care of this patient including H and P, review of records, counseling and coordination of care.     Rachel Moulds, MD 09/10/2022 10:58 AM

## 2022-09-17 ENCOUNTER — Other Ambulatory Visit: Payer: Self-pay | Admitting: Family Medicine

## 2022-09-17 DIAGNOSIS — R3129 Other microscopic hematuria: Secondary | ICD-10-CM

## 2022-10-15 ENCOUNTER — Ambulatory Visit
Admission: RE | Admit: 2022-10-15 | Discharge: 2022-10-15 | Disposition: A | Discharge status: Home / Self Care | Payer: Medicare Other | Source: Ambulatory Visit | Attending: Family Medicine | Admitting: Family Medicine

## 2022-10-15 DIAGNOSIS — R3129 Other microscopic hematuria: Secondary | ICD-10-CM

## 2022-10-23 ENCOUNTER — Other Ambulatory Visit: Payer: Self-pay | Admitting: Family Medicine

## 2022-10-23 DIAGNOSIS — R319 Hematuria, unspecified: Secondary | ICD-10-CM

## 2022-12-18 ENCOUNTER — Other Ambulatory Visit: Payer: Self-pay | Admitting: Family Medicine

## 2022-12-18 DIAGNOSIS — Z1239 Encounter for other screening for malignant neoplasm of breast: Secondary | ICD-10-CM

## 2022-12-24 ENCOUNTER — Other Ambulatory Visit: Payer: Self-pay | Admitting: Obstetrics and Gynecology

## 2022-12-24 DIAGNOSIS — E041 Nontoxic single thyroid nodule: Secondary | ICD-10-CM

## 2023-01-28 ENCOUNTER — Ambulatory Visit
Admission: RE | Admit: 2023-01-28 | Discharge: 2023-01-28 | Disposition: A | Discharge status: Home / Self Care | Payer: Medicare Other | Source: Ambulatory Visit | Attending: Obstetrics and Gynecology | Admitting: Obstetrics and Gynecology

## 2023-01-28 DIAGNOSIS — E041 Nontoxic single thyroid nodule: Secondary | ICD-10-CM

## 2023-02-11 ENCOUNTER — Ambulatory Visit
Admission: RE | Admit: 2023-02-11 | Discharge: 2023-02-11 | Disposition: A | Discharge status: Home / Self Care | Payer: Medicare Other | Source: Ambulatory Visit | Attending: Family Medicine | Admitting: Family Medicine

## 2023-02-11 DIAGNOSIS — Z1239 Encounter for other screening for malignant neoplasm of breast: Secondary | ICD-10-CM

## 2023-11-04 ENCOUNTER — Other Ambulatory Visit: Payer: Self-pay | Admitting: Family Medicine

## 2023-11-04 DIAGNOSIS — Z1231 Encounter for screening mammogram for malignant neoplasm of breast: Secondary | ICD-10-CM

## 2024-08-20 ENCOUNTER — Other Ambulatory Visit: Payer: Self-pay | Admitting: Surgery

## 2024-08-20 DIAGNOSIS — L819 Disorder of pigmentation, unspecified: Secondary | ICD-10-CM

## 2024-09-14 ENCOUNTER — Ambulatory Visit (HOSPITAL_COMMUNITY)
Admission: RE | Admit: 2024-09-14 | Discharge: 2024-09-14 | Disposition: A | Source: Ambulatory Visit | Attending: Surgery | Admitting: Surgery

## 2024-09-14 ENCOUNTER — Other Ambulatory Visit: Payer: Self-pay

## 2024-09-14 ENCOUNTER — Encounter: Payer: Self-pay | Admitting: Surgery

## 2024-09-14 ENCOUNTER — Ambulatory Visit: Admitting: Surgery

## 2024-09-14 VITALS — BP 108/74 | HR 65 | Temp 97.9°F | Ht 64.0 in | Wt 150.0 lb

## 2024-09-14 DIAGNOSIS — I513 Intracardiac thrombosis, not elsewhere classified: Secondary | ICD-10-CM

## 2024-09-14 DIAGNOSIS — L819 Disorder of pigmentation, unspecified: Secondary | ICD-10-CM

## 2024-09-14 DIAGNOSIS — I749 Embolism and thrombosis of unspecified artery: Secondary | ICD-10-CM

## 2024-09-14 NOTE — Progress Notes (Signed)
 [facility]in [facility]ist [facility]  Patient name: [name] MRN: [id number] DOB: 13-Dec-1975 Sex: female   REQUESTING PROVIDER:    Kevin Danker   REASON FOR CONSULT:    Left middle Finger discoloration  HISTORY OF PRESENT ILLNESS:   [name] is a 48 y.o. female, who is referred for evaluation of an episode she had 1 to 2 months ago where her left hand became very heavy.  She then noticed that her hand and fingers turned black and then she had pain in her left ring finger.  This lasted for several hours and then resolved.  She has not had any prior episodes or any additional episodes.  There were no activities associated with this event.  The patient does have a history of a VP shunt.  She tells me that her cholesterol is well-controlled.  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  Other Topics Concern   Not on file  Social History Narrative   Caffeine - coffee one cup/ lives with husband 2 level      Patient speaks [place] and her husband was here to translate for her  - signed cone papers to approve family member to interpret   Social Drivers of Corporate Investment Banker Strain: Not on file  Food Insecurity: Not on file  Transportation Needs: Not on file  Physical Activity: Not on file  Stress: Not on file  Social Connections: Not on file  Intimate Partner Violence: Not on file    ALLERGIES:    No Known Allergies  CURRENT MEDICATIONS:    Current Outpatient Medications  Medication Sig Dispense Refill   meloxicam (MOBIC) 7.5 MG tablet Take 7.5 mg by mouth as needed for pain. (Patient not taking: Reported on 12/25/2021)     No current facility-administered medications for this visit.    REVIEW OF SYSTEMS:   [X]  denotes positive finding, [ ]  denotes negative finding Cardiac  Comments:  Chest pain or chest pressure:    Shortness of breath upon exertion:    Short of breath when lying flat:    Irregular heart rhythm:        Vascular    Pain in calf, thigh, or hip brought on by ambulation:    Pain in feet at night that wakes you up from your sleep:     Blood clot in your veins:    Leg swelling:  Pulmonary    Oxygen at home:    Productive cough:     Wheezing:         Neurologic    Sudden weakness in arms or legs:     Sudden numbness in arms or legs:     Sudden onset of difficulty speaking or slurred speech:    Temporary loss of vision in one eye:     Problems with dizziness:         Gastrointestinal    Blood in stool:      Vomited blood:         Genitourinary    Burning when urinating:     Blood in urine:        Psychiatric    Major depression:         Hematologic    Bleeding problems:    Problems with blood clotting too easily:        Skin    Rashes or ulcers:        Constitutional    Fever or chills:     PHYSICAL EXAM:   Vitals:   09/14/24 0839  BP: 108/74  Pulse: 65  Temp: 97.9 F (36.6 C)  SpO2: 97%  Weight: 150 lb (68 kg)  Height: 5' 4 (1.626 m)    GENERAL: The patient is a  well-nourished female, in no acute distress. The vital signs are documented above. CARDIAC: There is a regular rate and rhythm.  VASCULAR: Palpable radial pulses PULMONARY: Nonlabored respirations MUSCULOSKELETAL: There are no major deformities or cyanosis. NEUROLOGIC: No focal weakness or paresthesias are detected. SKIN: There are no ulcers or rashes noted. PSYCHIATRIC: The patient has a normal affect.  STUDIES:   I have reviewed the following upper extremity duplex: Right: No significant arterial obstruction detected in the right         upper extremity.  Left: No significant arterial obstruction detected in the left upper        extremity.   ASSESSMENT and PLAN   Unclear etiology of her event.  I doubt that this is arterial given her normal duplex and palpable radial pulses, however I think that we need to rule out a embolic phenomenon.  The best way to do that is with a CT angiogram of her chest to look for arterial pathology as well as a echo to rule out thrombus.  I have encouraged her to take a baby aspirin on a daily basis.  She will follow-up after the studies have been obtained.   [name] CLORE, MD, FACS [facility] [phone] Pager [phone]

## 2024-09-15 ENCOUNTER — Ambulatory Visit (HOSPITAL_COMMUNITY): Admission: RE | Admit: 2024-09-15 | Discharge: 2024-09-15 | Attending: Cardiology | Admitting: Cardiology

## 2024-09-15 DIAGNOSIS — I513 Intracardiac thrombosis, not elsewhere classified: Secondary | ICD-10-CM | POA: Diagnosis not present

## 2024-09-15 LAB — ECHOCARDIOGRAM COMPLETE
Area-P 1/2: 3.97 cm2
S' Lateral: 2.8 cm

## 2024-09-21 ENCOUNTER — Ambulatory Visit (HOSPITAL_COMMUNITY): Admission: RE | Admit: 2024-09-21 | Discharge: 2024-09-21 | Attending: Surgery | Admitting: Surgery

## 2024-09-21 DIAGNOSIS — I513 Intracardiac thrombosis, not elsewhere classified: Secondary | ICD-10-CM | POA: Diagnosis present

## 2024-09-21 MED ORDER — IOHEXOL 350 MG/ML SOLN
75.0000 mL | Freq: Once | INTRAVENOUS | Status: AC | PRN
  Administered 2024-09-21: 08:00:00 75 mL via INTRAVENOUS

## 2024-09-28 ENCOUNTER — Ambulatory Visit: Attending: Surgery | Admitting: Surgery

## 2024-09-28 ENCOUNTER — Encounter: Payer: Self-pay | Admitting: Surgery

## 2024-09-28 VITALS — BP 106/73 | HR 64 | Temp 97.9°F | Ht 64.0 in | Wt 151.0 lb

## 2024-09-28 DIAGNOSIS — I749 Embolism and thrombosis of unspecified artery: Secondary | ICD-10-CM | POA: Diagnosis not present

## 2024-09-28 NOTE — Progress Notes (Signed)
 "                                    Vascular and Vein Specialist of [facility]  Patient name: [name] MRN: [id number] DOB: 14-Oct-1975 Sex: female   REASON FOR VISIT:    Follow up  HISOTRY OF PRESENT ILLNESS:    [name] is a 48 y.o. female, who I saw 2 weeks ago for an episode she had 1 to 2 months ago where her left hand became very heavy.  She then noticed that her hand and fingers turned black and then she had pain in her left ring finger.  This lasted for several hours and then resolved.  She has not had any prior episodes or any additional episodes.  There were no activities associated with this event.   The patient does have a history of a VP shunt.  She tells me that her cholesterol is well-controlled.  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 (Patient not taking: Reported on 12/25/2021)     No current facility-administered medications for this visit.    REVIEW OF SYSTEMS:   [X]  denotes positive finding, [ ]  denotes negative finding Cardiac  Comments:  Chest pain or chest pressure:    Shortness of breath upon exertion:    Short of breath when lying flat:    Irregular heart rhythm:        Vascular    Pain in calf, thigh, or hip brought on by  ambulation:    Pain in feet at night that wakes you up from your sleep:     Blood clot in your veins:    Leg swelling:         Pulmonary    Oxygen at home:    Productive cough:     Wheezing:         Neurologic    Sudden weakness in arms or legs:     Sudden numbness in arms or legs:     Sudden onset of difficulty speaking or slurred speech:    Temporary loss of vision in one eye:     Problems with dizziness:         Gastrointestinal    Blood in stool:     Vomited blood:         Genitourinary    Burning when urinating:     Blood in urine:        Psychiatric    Major depression:         Hematologic    Bleeding problems:  Problems with blood clotting too easily:        Skin    Rashes or ulcers:        Constitutional    Fever or chills:      PHYSICAL EXAM:   There were no vitals filed for this visit.  GENERAL: The patient is a well-nourished female, in no acute distress. The vital signs are documented above. CARDIAC: There is a regular rate and rhythm.  VASCULAR: Palpable radial pulses PULMONARY: Non-labored respiration MUSCULOSKELETAL: There are no major deformities or cyanosis. NEUROLOGIC: No focal weakness or paresthesias are detected. SKIN: There are no ulcers or rashes noted. PSYCHIATRIC: The patient has a normal affect.  STUDIES:   I have reviewed CTA:  1. No acute intrathoracic pathology. No CT evidence of pulmonary artery embolus. 2. Two nodules in the lateral right breast measure up to 13 mm. Correlation with clinical exam and mammographic studies recommended.   Echo: 1. Left ventricular ejection fraction, by estimation, is 55 to 60%. Left  ventricular ejection fraction by 3D volume is 55 %. The left ventricle has  normal function. The left ventricle has no regional wall motion  abnormalities. Left ventricular diastolic   parameters were normal.   2. Right ventricular systolic function is normal. The right ventricular  size is normal. There is  normal pulmonary artery systolic pressure.   3. The mitral valve is normal in structure. Trivial mitral valve  regurgitation. No evidence of mitral stenosis.   4. The aortic valve is normal in structure. Aortic valve regurgitation is  not visualized. No aortic stenosis is present.   5. The inferior vena cava is normal in size with greater than 50%  respiratory variability, suggesting right atrial pressure of 3 mmHg.   Conclusion(s)/Recommendation(s): No atrial thrombus visualized.  MEDICAL ISSUES:   Possible embolic event to the left finger: Workup has been unremarkable.  She had a CT scan that did not show any evidence of aneurysm or dissection.  Her echocardiogram was unremarkable.  There is no evidence of thrombus.  I have started her on a baby aspirin.  Her cholesterol panel is within normal limits.  The only thing left to do is send a hypercoagulable blood panel which I am doing today I will call her to go over the results in a month or so.  2 spots were seen on her right breast on CT scan.  She has had unremarkable MRIs with the exception of this past year.  I have told her that she needs to get a another breast MRI done.  She will speak to her PCP about this    Malvina Serene CLORE, MD, FACS [facility] [phone] Pager [phone]     [1] No Known Allergies  "

## 2024-09-30 ENCOUNTER — Other Ambulatory Visit: Payer: Self-pay | Admitting: *Deleted

## 2024-09-30 ENCOUNTER — Encounter: Payer: Self-pay | Admitting: *Deleted

## 2024-09-30 DIAGNOSIS — L819 Disorder of pigmentation, unspecified: Secondary | ICD-10-CM

## 2024-09-30 DIAGNOSIS — I749 Embolism and thrombosis of unspecified artery: Secondary | ICD-10-CM

## 2024-09-30 DIAGNOSIS — I513 Intracardiac thrombosis, not elsewhere classified: Secondary | ICD-10-CM

## 2024-09-30 NOTE — Progress Notes (Signed)
Orders entered in error

## 2024-09-30 NOTE — Progress Notes (Signed)
 Orders placed for hypercoagulable panel

## 2024-09-30 NOTE — Addendum Note (Signed)
 Addended by: TRUDY ELVIE SAILOR on: 09/30/2024 08:49 AM   Modules accepted: Orders

## 2024-11-09 ENCOUNTER — Ambulatory Visit: Admitting: Surgery
